# Patient Record
Sex: Female | Born: 1960
Health system: Southern US, Community
[De-identification: ages and names within clinical notes are randomized; demographics above are authoritative.]

## PROBLEM LIST (undated history)

## (undated) DIAGNOSIS — G5601 Carpal tunnel syndrome, right upper limb: Secondary | ICD-10-CM

## (undated) DIAGNOSIS — R51 Headache: Secondary | ICD-10-CM

## (undated) DIAGNOSIS — K219 Gastro-esophageal reflux disease without esophagitis: Secondary | ICD-10-CM

## (undated) DIAGNOSIS — Z973 Presence of spectacles and contact lenses: Secondary | ICD-10-CM

## (undated) DIAGNOSIS — Z9889 Other specified postprocedural states: Secondary | ICD-10-CM

## (undated) DIAGNOSIS — E039 Hypothyroidism, unspecified: Secondary | ICD-10-CM

## (undated) DIAGNOSIS — T50901A Poisoning by unspecified drugs, medicaments and biological substances, accidental (unintentional), initial encounter: Secondary | ICD-10-CM

## (undated) DIAGNOSIS — R569 Unspecified convulsions: Secondary | ICD-10-CM

## (undated) DIAGNOSIS — E7800 Pure hypercholesterolemia, unspecified: Secondary | ICD-10-CM

## (undated) DIAGNOSIS — T782XXA Anaphylactic shock, unspecified, initial encounter: Secondary | ICD-10-CM

## (undated) DIAGNOSIS — K3184 Gastroparesis: Secondary | ICD-10-CM

## (undated) DIAGNOSIS — T7840XA Allergy, unspecified, initial encounter: Secondary | ICD-10-CM

## (undated) DIAGNOSIS — L309 Dermatitis, unspecified: Secondary | ICD-10-CM

## (undated) DIAGNOSIS — N890 Mild vaginal dysplasia: Secondary | ICD-10-CM

## (undated) DIAGNOSIS — IMO0001 Reserved for inherently not codable concepts without codable children: Secondary | ICD-10-CM

## (undated) DIAGNOSIS — M8580 Other specified disorders of bone density and structure, unspecified site: Secondary | ICD-10-CM

## (undated) DIAGNOSIS — M1990 Unspecified osteoarthritis, unspecified site: Secondary | ICD-10-CM

## (undated) DIAGNOSIS — C73 Malignant neoplasm of thyroid gland: Secondary | ICD-10-CM

## (undated) DIAGNOSIS — F32A Depression, unspecified: Secondary | ICD-10-CM

## (undated) DIAGNOSIS — R112 Nausea with vomiting, unspecified: Secondary | ICD-10-CM

## (undated) DIAGNOSIS — F329 Major depressive disorder, single episode, unspecified: Secondary | ICD-10-CM

## (undated) DIAGNOSIS — R000 Tachycardia, unspecified: Secondary | ICD-10-CM

## (undated) HISTORY — DX: Anaphylactic shock, unspecified, initial encounter: T78.2XXA

## (undated) HISTORY — DX: Gastro-esophageal reflux disease without esophagitis: K21.9

## (undated) HISTORY — DX: Malignant neoplasm of thyroid gland: C73

## (undated) HISTORY — DX: Mild vaginal dysplasia: N89.0

## (undated) HISTORY — DX: Hypothyroidism, unspecified: E03.9

## (undated) HISTORY — DX: Depression, unspecified: F32.A

## (undated) HISTORY — DX: Gastroparesis: K31.84

## (undated) HISTORY — DX: Other specified disorders of bone density and structure, unspecified site: M85.80

## (undated) HISTORY — DX: Pure hypercholesterolemia, unspecified: E78.00

## (undated) HISTORY — DX: Poisoning by unspecified drugs, medicaments and biological substances, accidental (unintentional), initial encounter: T50.901A

## (undated) HISTORY — PX: NISSEN FUNDOPLICATION: SHX2091

## (undated) HISTORY — DX: Reserved for inherently not codable concepts without codable children: IMO0001

## (undated) HISTORY — PX: BREAST SURGERY: SHX581

## (undated) HISTORY — PX: COSMETIC SURGERY: SHX468

## (undated) HISTORY — PX: OTHER SURGICAL HISTORY: SHX169

## (undated) HISTORY — DX: Allergy, unspecified, initial encounter: T78.40XA

## (undated) HISTORY — DX: Major depressive disorder, single episode, unspecified: F32.9

---

## 2001-02-01 ENCOUNTER — Ambulatory Visit (HOSPITAL_COMMUNITY): Admission: RE | Admit: 2001-02-01 | Discharge: 2001-02-01 | Payer: Self-pay | Admitting: Family Medicine

## 2002-06-04 ENCOUNTER — Emergency Department (HOSPITAL_COMMUNITY): Admission: EM | Admit: 2002-06-04 | Discharge: 2002-06-04 | Payer: Self-pay | Admitting: Emergency Medicine

## 2002-11-23 HISTORY — PX: CHOLECYSTECTOMY: SHX55

## 2002-12-15 ENCOUNTER — Emergency Department (HOSPITAL_COMMUNITY): Admission: EM | Admit: 2002-12-15 | Discharge: 2002-12-15 | Payer: Self-pay | Admitting: Emergency Medicine

## 2003-02-02 ENCOUNTER — Encounter (INDEPENDENT_AMBULATORY_CARE_PROVIDER_SITE_OTHER): Payer: Self-pay | Admitting: *Deleted

## 2003-02-02 ENCOUNTER — Ambulatory Visit (HOSPITAL_COMMUNITY): Admission: RE | Admit: 2003-02-02 | Discharge: 2003-02-02 | Payer: Self-pay | Admitting: Emergency Medicine

## 2003-02-02 ENCOUNTER — Encounter: Payer: Self-pay | Admitting: Emergency Medicine

## 2003-03-20 ENCOUNTER — Encounter (INDEPENDENT_AMBULATORY_CARE_PROVIDER_SITE_OTHER): Payer: Self-pay | Admitting: Specialist

## 2003-03-20 ENCOUNTER — Observation Stay (HOSPITAL_COMMUNITY): Admission: RE | Admit: 2003-03-20 | Discharge: 2003-03-21 | Payer: Self-pay

## 2003-03-20 HISTORY — PX: VARICOSE VEIN SURGERY: SHX832

## 2004-08-26 ENCOUNTER — Ambulatory Visit (HOSPITAL_COMMUNITY): Admission: RE | Admit: 2004-08-26 | Discharge: 2004-08-26 | Payer: Self-pay | Admitting: Gastroenterology

## 2004-08-26 ENCOUNTER — Encounter (INDEPENDENT_AMBULATORY_CARE_PROVIDER_SITE_OTHER): Payer: Self-pay | Admitting: *Deleted

## 2004-08-26 HISTORY — PX: UPPER GASTROINTESTINAL ENDOSCOPY: SHX188

## 2004-10-13 ENCOUNTER — Inpatient Hospital Stay (HOSPITAL_COMMUNITY): Admission: RE | Admit: 2004-10-13 | Discharge: 2004-10-15 | Payer: Self-pay | Admitting: General Surgery

## 2004-10-13 ENCOUNTER — Encounter (INDEPENDENT_AMBULATORY_CARE_PROVIDER_SITE_OTHER): Payer: Self-pay | Admitting: *Deleted

## 2004-12-17 ENCOUNTER — Ambulatory Visit: Payer: Self-pay | Admitting: Oncology

## 2005-01-20 ENCOUNTER — Encounter (INDEPENDENT_AMBULATORY_CARE_PROVIDER_SITE_OTHER): Payer: Self-pay | Admitting: *Deleted

## 2005-01-20 ENCOUNTER — Ambulatory Visit: Payer: Self-pay | Admitting: Oncology

## 2005-01-20 ENCOUNTER — Ambulatory Visit (HOSPITAL_COMMUNITY): Admission: RE | Admit: 2005-01-20 | Discharge: 2005-01-20 | Payer: Self-pay | Admitting: Oncology

## 2005-05-24 ENCOUNTER — Emergency Department (HOSPITAL_COMMUNITY): Admission: EM | Admit: 2005-05-24 | Discharge: 2005-05-24 | Payer: Self-pay | Admitting: Emergency Medicine

## 2005-06-17 ENCOUNTER — Ambulatory Visit: Payer: Self-pay | Admitting: Oncology

## 2005-07-06 ENCOUNTER — Ambulatory Visit (HOSPITAL_COMMUNITY): Admission: RE | Admit: 2005-07-06 | Discharge: 2005-07-06 | Payer: Self-pay | Admitting: Gastroenterology

## 2005-11-23 DIAGNOSIS — C73 Malignant neoplasm of thyroid gland: Secondary | ICD-10-CM

## 2005-11-23 HISTORY — PX: VAGINAL HYSTERECTOMY: SUR661

## 2005-11-23 HISTORY — PX: THYROID SURGERY: SHX805

## 2005-11-23 HISTORY — DX: Malignant neoplasm of thyroid gland: C73

## 2005-12-16 ENCOUNTER — Ambulatory Visit: Payer: Self-pay | Admitting: Oncology

## 2006-06-07 ENCOUNTER — Ambulatory Visit (HOSPITAL_COMMUNITY): Admission: RE | Admit: 2006-06-07 | Discharge: 2006-06-08 | Payer: Self-pay | Admitting: Gynecology

## 2006-06-07 ENCOUNTER — Encounter (INDEPENDENT_AMBULATORY_CARE_PROVIDER_SITE_OTHER): Payer: Self-pay | Admitting: Specialist

## 2006-06-14 ENCOUNTER — Ambulatory Visit: Payer: Self-pay | Admitting: Oncology

## 2006-06-17 LAB — CBC WITH DIFFERENTIAL/PLATELET
BASO%: 0.5 % (ref 0.0–2.0)
Basophils Absolute: 0.1 10e3/uL (ref 0.0–0.1)
EOS%: 3.4 % (ref 0.0–7.0)
Eosinophils Absolute: 0.4 10e3/uL (ref 0.0–0.5)
HCT: 41.3 % (ref 34.8–46.6)
HGB: 13.9 g/dL (ref 11.6–15.9)
LYMPH%: 31.5 % (ref 14.0–48.0)
MCH: 31.4 pg (ref 26.0–34.0)
MCHC: 33.8 g/dL (ref 32.0–36.0)
MCV: 92.9 fL (ref 81.0–101.0)
MONO#: 0.8 10e3/uL (ref 0.1–0.9)
MONO%: 7 % (ref 0.0–13.0)
NEUT#: 6.5 10e3/uL (ref 1.5–6.5)
NEUT%: 57.6 % (ref 39.6–76.8)
Platelets: 354 10e3/uL (ref 145–400)
RBC: 4.44 10e6/uL (ref 3.70–5.32)
RDW: 12.4 % (ref 11.3–14.5)
WBC: 11.2 10e3/uL — ABNORMAL HIGH (ref 3.9–10.0)
lymph#: 3.5 10e3/uL — ABNORMAL HIGH (ref 0.9–3.3)

## 2006-06-17 LAB — COMPREHENSIVE METABOLIC PANEL
ALT: 20 U/L (ref 0–40)
AST: 22 U/L (ref 0–37)
Alkaline Phosphatase: 52 U/L (ref 39–117)
BUN: 9 mg/dL (ref 6–23)
Creatinine, Ser: 0.75 mg/dL (ref 0.40–1.20)
Total Bilirubin: 0.6 mg/dL (ref 0.3–1.2)

## 2007-05-18 ENCOUNTER — Other Ambulatory Visit: Admission: RE | Admit: 2007-05-18 | Discharge: 2007-05-18 | Payer: Self-pay | Admitting: Gynecology

## 2007-08-29 ENCOUNTER — Ambulatory Visit (HOSPITAL_COMMUNITY): Admission: RE | Admit: 2007-08-29 | Discharge: 2007-08-29 | Payer: Self-pay | Admitting: Emergency Medicine

## 2008-04-11 ENCOUNTER — Ambulatory Visit (HOSPITAL_COMMUNITY): Admission: RE | Admit: 2008-04-11 | Discharge: 2008-04-11 | Payer: Self-pay | Admitting: Gastroenterology

## 2008-05-24 ENCOUNTER — Other Ambulatory Visit: Admission: RE | Admit: 2008-05-24 | Discharge: 2008-05-24 | Payer: Self-pay | Admitting: Gynecology

## 2008-11-28 ENCOUNTER — Encounter: Payer: Self-pay | Admitting: Gynecology

## 2008-11-28 ENCOUNTER — Ambulatory Visit: Payer: Self-pay | Admitting: Gynecology

## 2008-11-28 ENCOUNTER — Other Ambulatory Visit: Admission: RE | Admit: 2008-11-28 | Discharge: 2008-11-28 | Payer: Self-pay | Admitting: Gynecology

## 2009-02-27 ENCOUNTER — Emergency Department (HOSPITAL_COMMUNITY): Admission: EM | Admit: 2009-02-27 | Discharge: 2009-02-27 | Payer: Self-pay | Admitting: Emergency Medicine

## 2009-06-10 ENCOUNTER — Encounter: Payer: Self-pay | Admitting: Gynecology

## 2009-06-10 ENCOUNTER — Other Ambulatory Visit: Admission: RE | Admit: 2009-06-10 | Discharge: 2009-06-10 | Payer: Self-pay | Admitting: Gynecology

## 2009-06-10 ENCOUNTER — Ambulatory Visit: Payer: Self-pay | Admitting: Gynecology

## 2009-06-11 ENCOUNTER — Ambulatory Visit: Payer: Self-pay | Admitting: Gynecology

## 2009-07-09 ENCOUNTER — Encounter: Admission: RE | Admit: 2009-07-09 | Discharge: 2009-07-09 | Payer: Self-pay | Admitting: Emergency Medicine

## 2009-12-27 ENCOUNTER — Other Ambulatory Visit: Admission: RE | Admit: 2009-12-27 | Discharge: 2009-12-27 | Payer: Self-pay | Admitting: Gynecology

## 2009-12-27 ENCOUNTER — Ambulatory Visit: Payer: Self-pay | Admitting: Gynecology

## 2010-02-24 ENCOUNTER — Encounter: Admission: RE | Admit: 2010-02-24 | Discharge: 2010-02-24 | Payer: Self-pay | Admitting: Emergency Medicine

## 2010-08-13 ENCOUNTER — Ambulatory Visit: Payer: Self-pay | Admitting: Gynecology

## 2010-08-13 ENCOUNTER — Other Ambulatory Visit: Admission: RE | Admit: 2010-08-13 | Discharge: 2010-08-13 | Payer: Self-pay | Admitting: Gynecology

## 2010-08-22 ENCOUNTER — Ambulatory Visit: Payer: Self-pay | Admitting: Gynecology

## 2010-10-22 ENCOUNTER — Emergency Department (HOSPITAL_COMMUNITY)
Admission: EM | Admit: 2010-10-22 | Discharge: 2010-10-23 | Payer: Self-pay | Source: Home / Self Care | Admitting: Emergency Medicine

## 2010-10-31 ENCOUNTER — Emergency Department (HOSPITAL_COMMUNITY)
Admission: EM | Admit: 2010-10-31 | Discharge: 2010-10-31 | Payer: Self-pay | Source: Home / Self Care | Admitting: Emergency Medicine

## 2010-11-16 ENCOUNTER — Emergency Department (HOSPITAL_COMMUNITY)
Admission: EM | Admit: 2010-11-16 | Discharge: 2010-11-16 | Payer: Self-pay | Source: Home / Self Care | Admitting: Emergency Medicine

## 2010-11-23 HISTORY — PX: HERNIA REPAIR: SHX51

## 2010-12-14 ENCOUNTER — Encounter: Payer: Self-pay | Admitting: Gastroenterology

## 2011-01-15 ENCOUNTER — Emergency Department (HOSPITAL_BASED_OUTPATIENT_CLINIC_OR_DEPARTMENT_OTHER)
Admission: EM | Admit: 2011-01-15 | Discharge: 2011-01-15 | Disposition: A | Discharge status: Home / Self Care | Payer: Managed Care, Other (non HMO) | Attending: Emergency Medicine | Admitting: Emergency Medicine

## 2011-01-15 ENCOUNTER — Emergency Department (HOSPITAL_COMMUNITY)
Admission: EM | Admit: 2011-01-15 | Discharge: 2011-01-15 | Disposition: A | Discharge status: Home / Self Care | Payer: Managed Care, Other (non HMO) | Attending: Emergency Medicine | Admitting: Emergency Medicine

## 2011-01-15 DIAGNOSIS — Z9109 Other allergy status, other than to drugs and biological substances: Secondary | ICD-10-CM | POA: Insufficient documentation

## 2011-01-15 DIAGNOSIS — T7840XA Allergy, unspecified, initial encounter: Secondary | ICD-10-CM | POA: Insufficient documentation

## 2011-01-15 DIAGNOSIS — Z79899 Other long term (current) drug therapy: Secondary | ICD-10-CM | POA: Insufficient documentation

## 2011-01-15 DIAGNOSIS — Z9889 Other specified postprocedural states: Secondary | ICD-10-CM | POA: Insufficient documentation

## 2011-01-15 DIAGNOSIS — R0609 Other forms of dyspnea: Secondary | ICD-10-CM | POA: Insufficient documentation

## 2011-01-15 DIAGNOSIS — Y929 Unspecified place or not applicable: Secondary | ICD-10-CM | POA: Insufficient documentation

## 2011-01-15 DIAGNOSIS — E789 Disorder of lipoprotein metabolism, unspecified: Secondary | ICD-10-CM | POA: Insufficient documentation

## 2011-01-15 DIAGNOSIS — I10 Essential (primary) hypertension: Secondary | ICD-10-CM | POA: Insufficient documentation

## 2011-01-15 DIAGNOSIS — R0989 Other specified symptoms and signs involving the circulatory and respiratory systems: Secondary | ICD-10-CM | POA: Insufficient documentation

## 2011-01-15 DIAGNOSIS — J45909 Unspecified asthma, uncomplicated: Secondary | ICD-10-CM | POA: Insufficient documentation

## 2011-03-04 LAB — POCT I-STAT, CHEM 8
Calcium, Ion: 1.14 mmol/L (ref 1.12–1.32)
Chloride: 108 mEq/L (ref 96–112)
Creatinine, Ser: 0.7 mg/dL (ref 0.4–1.2)
Glucose, Bld: 121 mg/dL — ABNORMAL HIGH (ref 70–99)
HCT: 40 % (ref 36.0–46.0)
Hemoglobin: 13.6 g/dL (ref 12.0–15.0)
Potassium: 4.3 mEq/L (ref 3.5–5.1)

## 2011-04-10 NOTE — Discharge Summary (Signed)
NAME:  Michelle [name], Michelle [name]                ACCOUNT NO.:  [id number]   MEDICAL RECORD NO.:  [id number]          PATIENT TYPE:  OIB   LOCATION:  9311                          FACILITY:  [facility]   PHYSICIAN:  Timothy P. Fontaine, M.D.DATE OF BIRTH:  18-Aug-1961   DATE OF ADMISSION:  06/07/2006  DATE OF DISCHARGE:                                 DISCHARGE SUMMARY   DISCHARGE DIAGNOSES:  Irregular menses, menorrhagia.   PROCEDURE:  Total vaginal hysterectomy 06/07/2006, Dr. Colin Broach.   PATHOLOGY:  Pending.   HOSPITAL COURSE:  50 year old G1, P28 female with irregular menses and  menorrhagia, underwent uncomplicated total vaginal hysterectomy 06/07/2006.  The patient's postoperative course was uncomplicated.  She was discharged on  postoperative day #1 ambulating well, tolerating a regular diet with a  postoperative hemoglobin of 10.8.  The patient received precautions,  instructions and follow-up will be seen in the office in 2 weeks, received a  prescription for Tylox #20, one to two p.o. q.4-6 h. p.r.n. pain.      Timothy P. Fontaine, M.D.  Electronically Signed     TPF/MEDQ  D:  06/08/2006  T:  06/08/2006  Job:  [id number]

## 2011-04-10 NOTE — H&P (Signed)
NAME:  Michelle [name], Michelle [name]                ACCOUNT NO.:  [id number]   MEDICAL RECORD NO.:  [id number]          PATIENT TYPE:  AMB   LOCATION:  SDC                           FACILITY:  [facility]   PHYSICIAN:  Timothy P. Fontaine, M.D.DATE OF BIRTH:  1961-11-13   DATE OF ADMISSION:  DATE OF DISCHARGE:                                HISTORY & PHYSICAL   CHIEF COMPLAINT:  Irregular menses.   HISTORY OF PRESENT ILLNESS:  A 50 year old G62, P84 female vasectomy birth  control who presents complaining of irregular menses where over the past  year she has had bleeding on and off throughout the month to include  significant postcoital bleeding as well as spontaneous spotting almost on a  daily basis.  Outpatient evaluation includes a negative sonohysterogram, an  endometrial biopsy showing weakly proliferative endometrium, no hyperplasia,  and laboratory studies to include normal thyroid, FSH, and prolactin levels.  The options for management to include hormonal manipulation with a  combination of estrogen and progesterone as well as intermittent  progesterone withdrawal, Jearld Adjutant IUD, endometrial ablation, and hysterectomy  were all reviewed.  The patient was to proceed with hysterectomy.  She is  admitted for a total vaginal hysterectomy.   PAST SURGICAL HISTORY:  Significant for partial thyroid resection due to  papillary carcinoma of the thyroid, Nissen fundoplication for reflux  disease, breast reductive surgery and buttocks liposuction and implant  surgery   PAST MEDICAL HISTORY:  1.  Asthma.  2.  Hypercholesterolemia.  3.  History of hypertension although she is off of all blood pressure      medications with a normal blood pressure.  4.  Reflux.   CURRENT MEDICATIONS:  Her current medications include Crestor, Zetia,  Singulair and p.r.n. albuterol inhaler.   MEDICATION ALLERGIES:  INCLUDE BACTRIM, DOXYCYCLINE, BIAXIN, SYNTHROID   REVIEW OF SYSTEMS:  Noncontributory.   SOCIAL HISTORY:   Noncontributory.   FAMILY HISTORY:  Noncontributory.   ADMISSION PHYSICAL EXAMINATION:  VITAL SIGNS:  Afebrile.  Vital signs are  stable.  HEENT:  Normal.  LUNGS:  Clear.  CARDIAC:  Regular rate without rubs, murmurs or gallops.  ABDOMINAL EXAM:  Benign.  PELVIC:  External, BUS, vagina normal.  Cervix normal.  Uterus normal size,  midline, and mobile.  Adnexa without masses or tenderness.   ASSESSMENT:  A 50 year old G1, P18 female irregular menses, normal  ultrasound, sonohistogram negative, endometrial biopsy, normal hormone  values for total vaginal hysterectomy.  The risks, benefits, indications and  alternatives for the procedure were reviewed with her to include long-term  and short-term issues, intraoperative postoperative risks.  Ovarian  conservation issue was reviewed with her and her husband and the options of  keeping both ovaries versus removing both ovaries were discussed, the issues  of keeping her ovaries with the risk of ovarian cancer in the future as well  as benign ovarian disease requiring reoperation versus removing her ovaries  and the issues of hypoestrogenism, possibilities of estrogen-replacement  therapy with its inherent risks were all discussed.  The patient prefers to  keep both ovaries.  She accepts the risks  of ovarian cancer and ovarian  disease in the future.  She does give me permission though to remove one or  both ovaries if significant disease is encountered at the time of surgery.  Absolute irreversible sterility associated with hysterectomy was also  discussed, understood, and accepted.  Sexuality following hysterectomy was  reviewed, the potential for persistent dyspareunia as well as orgasmic  dysfunction in the future was all discussed, understood and accepted.  The  patient understands that at any time during the procedure that I may need to  convert the vaginal hysterectomy into an abdominal hysterectomy if it is  felt unwise to continue or  if complications occur during the procedure.  The  risks of infection both internal requiring prolonged antibiotics, abscess  formation or cuff hematoma/cuff abscess requiring reoperation and drainage  as well as incisional complications if indeed incisions are made requiring  opening and draining of incisions and closure by secondary intention was all  discussed, understood and accepted.  The risks of hemorrhage necessitating  transfusion and risks of transfusion were reviewed to include transfusion  reaction, hepatitis, HIV, Mad Cow Disease, and other unknown entities.  The  risk of inadvertent injury to internal organs including bowel, bladder,  ureters, vessels and nerves necessitating major exploratory reparative  surgeries and future reparative surgeries including ostomy formation either  immediately recognized or delay recognized was all discussed, understood and  accepted.  The patient had previously been on antihypertensives although no  longer is on these with a normal blood pressure.  She underwent cardiac  clearance [facility] with a normal ECG, normal  stress ECG, all of which were normal.  Preoperative blood work is pending at  the time of this dictation.  The patient's questions were answered to her  satisfaction, and she is ready to proceed with surgery.      Timothy P. Fontaine, M.D.  Electronically Signed     TPF/MEDQ  D:  05/31/2006  T:  05/31/2006  Job:  [id number]

## 2011-04-10 NOTE — Op Note (Signed)
NAME:  Michelle [name], Michelle [name]                ACCOUNT NO.:  [id number]   MEDICAL RECORD NO.:  [id number]          PATIENT TYPE:  AMB   LOCATION:  ENDO                         FACILITY:  [facility]   PHYSICIAN:  James L. Malon Kindle., M.D.DATE OF BIRTH:  Jan 09, 1961   DATE OF PROCEDURE:  08/26/2004  DATE OF DISCHARGE:                                 OPERATIVE REPORT   PROCEDURES:  Esophageal manometry.   INDICATION FOR PROCEDURE:  An unfortunate 50 year old woman with continued  severe esophageal reflux, who is being considered for an antireflux  operation.  Endoscopy done just before this procedure showed a hiatal hernia  with essentially a nondetectable LES and evidence of reflux.   DESCRIPTION OF PROCEDURE:  The procedure was performed at the [facility]  Manometry Laboratory in the usual manner.  No provocative studies were  performed.  The results are as follows:  1.  Upper esophageal sphincter  poorly seen on available tracings.  Unable to actually evaluate relaxation.  2.  Esophageal body.  Mean amplitude in the distal esophagus 39 mm, within  the normal range, but somewhat on the low side.  Mean duration of 3.4  seconds, within the normal range.  Peristalsis in all 10 wet swallows  appeared to be normal.  3.  Lower esophageal sphincter.  The mean pressure  is calculated by the computer and was 9.7 mm with nearly complete  relaxation.  However, my review of the tracings showed essentially and  really nondetectable LES.   ASSESSMENT:  Hypotensive lower esophageal sphincter consistent with severe  esophageal reflux disease.   PLAN:  Will proceed with surgical referral as planned.       JLE/MEDQ  D:  08/26/2004  T:  08/26/2004  Job:  [id number]   cc:   Stan Head. Cleta Alberts, M.D.  [address]  [place]  [place] [address]  Fax: [phone]   Lorne Skeens. Hoxworth, M.D.  [address], [address]  [place]  [place] [address]  Fax: [phone]

## 2011-04-10 NOTE — Op Note (Signed)
NAMECAYLEY, Michelle [name]                            ACCOUNT NO.:  [id number]   MEDICAL RECORD NO.:  [id number]                   PATIENT TYPE:  AMB   LOCATION:  DAY                                  FACILITY:  [facility]   PHYSICIAN:  Lorre Munroe., M.D.            DATE OF BIRTH:  01-19-1961   DATE OF PROCEDURE:  03/20/2003  DATE OF DISCHARGE:                                 OPERATIVE REPORT   PREOPERATIVE DIAGNOSES:  1. Right lobe thyroid nodule, benign.  2. Varicose vein on the right leg.   POSTOPERATIVE DIAGNOSES:  1. Right lobe thyroid nodule, benign.  2. Varicose vein on the right leg.   OPERATION:  1. Partial thyroidectomy.  2. Excision of varicose vein.   SURGEON:  Lebron Conners, M.D.   ASSISTANT:  Gita Kudo, M.D.   ANESTHESIA:  General.   DESCRIPTION OF PROCEDURE:  After the patient was monitored and anesthetized  and had routine preparation and draping of the neck, I marked an 8 cm collar  incision symmetrically in the lower neck, incised the skin, subcutaneous  tissues and platysma. I got hemostasis with the Bovie. I then developed  flaps beneath the platysma superiorly to the thyroid cartilage and  inferiorly to the sternal notch and clavicle. I put in a Mahorner retractor  and that provided excellent exposure of the midline of the neck. I incised  the midline and exposed the isthmus and anterior parts of the thyroid gland.  There was an obvious nodule in the anterior part of the right lobe just as  it joined the isthmus. I dissected the anterior surface of the gland free  laterally on both sides and found no additional nodules. It appeared that  the nodule could be safely and easily excised with a rim of normal thyroid  tissue by removing the isthmus and small part of the anterior right lobe. I  took down the vessels clipping them as necessary with small or medium clips.  I divided the isthmus to the left of the trachea suture ligating the portion  towards  the left lobe. I used 3-0 silk for this suture ligature. I then  dissected laterally well past the nodule and clamped the anterior part of  the right lobe and removed the isthmus. I then suture ligated that. After  good hemostasis, I closed the neck closing the midline with interrupted  silks, the platysma with running 3-0 Vicryl and the skin with intracuticular  4-0 Vicryl and  Steri-Strips. I made two small incisions on the prepped medial aspect of the  right leg and incised the vein which the patient desired to have removed.  Hemostasis was not a problem. After bandage application, the patient went to  the recovery room in good condition.  Lorre Munroe., M.D.    WB/MEDQ  D:  03/20/2003  T:  03/20/2003  Job:  [id number]   cc:   Brett Canales A. Cleta Alberts, M.D.  [address]  [place]  [place] [address]  Fax: [phone]

## 2011-04-10 NOTE — Op Note (Signed)
[name], [name]                ACCOUNT NO.:  [id number]   MEDICAL RECORD NO.:  [id number]          PATIENT TYPE:  INP   LOCATION:  0444                         FACILITY:  [facility]   PHYSICIAN:  Sharlet Salina T. Hoxworth, M.D.DATE OF BIRTH:  15-Feb-1961   DATE OF PROCEDURE:  10/13/2004  DATE OF DISCHARGE:                                 OPERATIVE REPORT   PREOPERATIVE DIAGNOSES:  1.  Severe gastroesophageal reflux disease.  2.  Biliary dyskinesia.   SURGICAL PROCEDURE:  1.  Laparoscopic Nissen fundoplication.  2.  Laparoscopic cholecystectomy.   SURGEON:  Dr. Johna Sheriff   ASSISTANT:  Dr. Carman Ching   ANESTHESIA:  General.   BRIEF HISTORY:  Michelle [name] is a 50 year old female with long history of  gastroesophageal reflux disease, now very poorly controlled with medical  management.  After extensive discussion of options, we have elected to  proceed with laparoscopic Nissen fundoplication for her reflux disease.  She  also has had intermittent episodic right upper quadrant abdominal pain and  has a markedly abnormal HIDA scan with 0% gallbladder emptying in 60  minutes.  She is felt to have significant biliary dyskinesia, and  laparoscopic cholecystectomy at the same procedure is recommended and  accepted.  The nature of the procedure, its indications; risks of bleeding,  infection, intestinal injury, bile leak, bile duct injury, and various side  effects of her fundoplication have been discussed and understood.  She is  now brought to the operating room for this procedure.   DESCRIPTION OF OPERATION:  The patient brought to the operating room and  placed in supine position on the operating table.  General endotracheal  anesthesia was induced.  She was given preoperative antibiotics.  The  abdomen was widely sterilely prepped and draped.  Access was obtained with  11 mm OptiView trocar in the left subcostal space without difficulty and  pneumoperitoneum established.  Under direct  vision, two 11 mm trocars were  placed in the right upper abdomen, another 11 mm in the left abdomen near  the midline, and a 5 mm along the right flank.  The gastrohepatic omentum  was divided in an avascular area.  Dissection was carried up along the right  crus with the harmonic scalpel, and peritoneum overlying the EG junction was  incised with the harmonic scalpel.  Dissection was carried down along the  left crus, and the angle of His was mobilized.  There was plenty of  esophageal length.  The EG junction was retracted inferiorly and anteriorly,  and the right crus was mobilized and the retroesophageal space easily  entered and the esophagus more fully mobilized for 5-6 cm.  Dissection was  carried over to the left crus which was identified and a retroesophageal  window easily created.  Following this, short gastrics were mobilized off of  the upper fundus with the harmonic scalpel and the lesser sac entered.  The  fundus, angle of His, and EG junction were completely mobilized.  The crura  were cleared down to their junction posteriorly.  There was a good sized  hiatal hernia present.  Initially this was repaired with two posterior  sutures of 2-0 Ethibond with pledgets.  In order to avoid angulation of the  esophagus, I placed one pledgeted 2-0 Ethibond suture anteriorly as well.  The lighted 50 Bougie dilator was then passed which went through the repair  with no additional room, and this appeared to be an adequate closure.  The  fundus was brought posteriorly for the 360-degree wrap easily, and a  contiguous portion of the fundus was identified on the left side and brought  up for a 360-degree wrap, making sure this was contiguous and went easily  over the 50 Bougie dilator.  An approximately 3 cm wrap was then created  with three interrupted 2-0 Ethibond sutures, incorporating the anterior wall  of the esophagus with each suture.  With the Bougie then removed, this was  seen to  be a nice, floppy wrap.  At this point, attention was turned to the  cholecystectomy.  An additional 5 mm trocar was placed on the right flank  for retraction, the fundus grasped and elevated and the infundibulum  retracted inferolaterally with adequate exposure of the Calot's triangle and  distal gallbladder.  The peritoneum on either side of the distal gallbladder  and Calot's triangle was incised and with careful blunt dissection, the  cystic duct and cystic artery were clearly identified and the cystic duct  gallbladder junction dissected 360 degrees.  When the anatomy was clear, the  cystic duct and cystic artery were divided between two proximal and one  distal clip.  The gallbladder was then dissected free from its bed using  cautery and removed through one of the 11 mm trocar sites.  Complete  hemostasis was obtained in the gallbladder bed with cautery and a Surgicel  pack.  Both operative sites were then inspected for hemostasis and trocars  removed under direct vision, all CO2 evacuated.  Skin incisions were closed  with interrupted subcuticular 4-0 Monocryl and Steri-Strips.  Sponge,  needle, and instrument counts were correct.  Dry sterile dressings were  applied and the patient taken to recovery in good condition.     Benj   BTH/MEDQ  D:  10/13/2004  T:  10/13/2004  Job:  [id number]

## 2011-04-10 NOTE — Op Note (Signed)
NAME:  Michelle [name], Michelle [name]                ACCOUNT NO.:  [id number]   MEDICAL RECORD NO.:  [id number]          PATIENT TYPE:  AMB   LOCATION:  ENDO                         FACILITY:  [facility]   PHYSICIAN:  James L. Malon Kindle., M.D.DATE OF BIRTH:  March 05, 1961   DATE OF PROCEDURE:  08/26/2004  DATE OF DISCHARGE:                                 OPERATIVE REPORT   PROCEDURE:  .  Esophagogastroduodenoscopy and biopsy.   ENDOSCOPIST:  Llana Aliment. Edwards, M.D.   MEDICATIONS:  Fentanyl 60 mcg, Versed 10 mg IV.   INDICATION:  A nice 50 year old woman with persistent longstanding  esophageal reflux that has gradually gotten worse.  An upper GI has shown  hiatal hernia with reflux.  This is done in anticipation of a probable  antireflux operation.   DESCRIPTION OF PROCEDURE:  The procedure had been explained to the patient  and consent obtained.  With the patient in the left lateral decubitus  position, the Olympus scope was inserted and advanced.  The stomach was  entered, pylorus identified and passed.  The duodenum including the bulb and  second portion were seen well, and were unremarkable.  The pyloric channel  was normal, as was the antrum.  In the proximal body near the junction of  the cardia was a 1-cm gastric polyp that was biopsied.  The diaphragm was  located at 38 cm.  There was a 4-cm hiatal hernia.  The Z-line was somewhat  irregular, but there was no gross evidence of Barrett's and was located at  34 cm, and was widely patent with free reflux noted.  The distal esophagus  was somewhat reddened, but was not ulcerated.  The scope was withdrawn; the  proximal esophagus was normal.  The patient tolerated the procedure well and  was maintained on a low-flow oxygen and pulse oximetry throughout the  procedure.   ASSESSMENT:  1.  Esophageal reflux, 530.81.  2.  Gastric polyp, possibly a fundic gland or hyperplastic, 211.1.   PLAN:  We will check pathology and refer to Dr. Sharlet Salina T.  Hoxworth.  We  will also check manometry results.       JLE/MEDQ  D:  08/26/2004  T:  08/26/2004  Job:  [id number]   cc:   Stan Head. Cleta Alberts, M.D.  [address]  [place]  [place] [address]  Fax: [phone]   Lorne Skeens. Hoxworth, M.D.  [address]  [place]  [place] [address]  Fax: [phone]

## 2011-04-10 NOTE — Op Note (Signed)
NAME:  Michelle [name], Michelle [name]                ACCOUNT NO.:  [id number]   MEDICAL RECORD NO.:  [id number]          PATIENT TYPE:  AMB   LOCATION:  SDC                           FACILITY:  [facility]   PHYSICIAN:  Timothy P. Fontaine, M.D.DATE OF BIRTH:  [birth year]   DATE OF PROCEDURE:  06/07/2006  DATE OF DISCHARGE:                                 OPERATIVE REPORT   PREOPERATIVE DIAGNOSES:  1.  Irregular menses.  2.  Menorrhagia.   POSTOPERATIVE DIAGNOSES:  1.  Irregular menses.  2.  Menorrhagia.   PROCEDURE:  Total vaginal hysterectomy.   SURGEON:  Timothy P. Fontaine, M.D.   ASSISTANT:  Rande Brunt. Eda Paschal, M.D.   ANESTHESIA:  General.   ESTIMATED BLOOD LOSS:  Less than 100 mL.   COMPLICATIONS:  None.   SPECIMEN:  Uterus.   FINDINGS:  EUA:  External, BUS, vagina normal.  Cervix normal, uterus normal  size, midline and mobile.  Adnexa without masses.  Surgical:  Uterus grossly  normal in size, shape and contour, right and left ovaries and right and left  fallopian tube segments grossly normal, pelvis normal to limited inspection.   PROCEDURE:  The patient was taken to the operating room, underwent general  endotracheal anesthesia, was placed low dorsal lithotomy position and  received a perineal vaginal preparation with Betadine solution.  Bladder  emptied with in-and-out Foley catheterization.  EUA was performed.  The  patient was draped in the usual fashion.  Cervix visualized with a weighted  speculum, grasped with a single-tooth tenaculum and the cervical mucosa was  circumferentially injected using 1% lidocaine with epinephrine 1:100,000  dilution, total of 8 mL.  The cervical mucosa was then circumferentially  incised and the paracervical planes were sharply developed.  The posterior  cul-de-sac was then sharply entered without difficulty.  A long weighted  speculum placed and the right and left uterosacral ligaments were  identified, clamped, cut and ligated using 0 Vicryl  suture and tagged for  future reference.  The vesicouterine plane was then progressively sharply  developed and ultimately the anterior cul-de-sac sharply entered.  The  uterus was then freed from its attachments through progressive clamping,  cutting and ligating of the paracervical parametrial tissues using 0 Vicryl  suture.  Uterus was then delivered through the vagina and the uterine  ovarian pedicles bilaterally were clamped and cut and the specimen removed.  The bilateral pedicles were then secured using a 0 Vicryl stitch followed by  suture ligature bilaterally.  The long weighted speculum was replaced by a  shorter weighted speculum, the bowel packed with a tagged tail sponge and  the pelvis was irrigated showing adequate hemostasis at all pedicle sites.  The posterior vaginal mucosa was then run from uterosacral ligament to  uterosacral ligament using 0 Vicryl suture in a running interlocking stitch.  The bowel packing was then removed and the vagina was closed anterior to  posterior using 0 Vicryl suture in interrupted figure-of-eight stitch.  The  vagina was irrigated.  Adequate hemostasis visualized at the cuff.  A Foley  catheter was placed in sterile technique  showing free-flowing clear yellow  urine.  The patient was placed in the supine position, awakened without  difficulty and taken to the recovery room in good condition having tolerated  procedure well.      Timothy P. Fontaine, M.D.  Electronically Signed     TPF/MEDQ  D:  06/07/2006  T:  06/07/2006  Job:  [id number]

## 2011-05-12 ENCOUNTER — Other Ambulatory Visit (HOSPITAL_COMMUNITY)
Admission: RE | Admit: 2011-05-12 | Discharge: 2011-05-12 | Disposition: A | Discharge status: Home / Self Care | Payer: Managed Care, Other (non HMO) | Source: Ambulatory Visit | Attending: Gynecology | Admitting: Gynecology

## 2011-05-12 ENCOUNTER — Ambulatory Visit (INDEPENDENT_AMBULATORY_CARE_PROVIDER_SITE_OTHER): Payer: Managed Care, Other (non HMO) | Admitting: Gynecology

## 2011-05-12 ENCOUNTER — Other Ambulatory Visit: Payer: Self-pay | Admitting: Gynecology

## 2011-05-12 DIAGNOSIS — N951 Menopausal and female climacteric states: Secondary | ICD-10-CM

## 2011-05-12 DIAGNOSIS — R87619 Unspecified abnormal cytological findings in specimens from cervix uteri: Secondary | ICD-10-CM | POA: Insufficient documentation

## 2011-05-12 DIAGNOSIS — N893 Dysplasia of vagina, unspecified: Secondary | ICD-10-CM

## 2011-06-10 ENCOUNTER — Encounter (HOSPITAL_BASED_OUTPATIENT_CLINIC_OR_DEPARTMENT_OTHER): Payer: Self-pay | Admitting: *Deleted

## 2011-06-10 ENCOUNTER — Emergency Department (HOSPITAL_BASED_OUTPATIENT_CLINIC_OR_DEPARTMENT_OTHER)
Admission: EM | Admit: 2011-06-10 | Discharge: 2011-06-10 | Disposition: A | Discharge status: Home / Self Care | Payer: Managed Care, Other (non HMO) | Attending: Emergency Medicine | Admitting: Emergency Medicine

## 2011-06-10 DIAGNOSIS — J3489 Other specified disorders of nose and nasal sinuses: Secondary | ICD-10-CM | POA: Insufficient documentation

## 2011-06-10 DIAGNOSIS — R0602 Shortness of breath: Secondary | ICD-10-CM | POA: Insufficient documentation

## 2011-06-10 DIAGNOSIS — E7800 Pure hypercholesterolemia, unspecified: Secondary | ICD-10-CM | POA: Insufficient documentation

## 2011-06-10 DIAGNOSIS — I10 Essential (primary) hypertension: Secondary | ICD-10-CM | POA: Insufficient documentation

## 2011-06-10 DIAGNOSIS — Z85850 Personal history of malignant neoplasm of thyroid: Secondary | ICD-10-CM | POA: Insufficient documentation

## 2011-06-10 DIAGNOSIS — T7840XA Allergy, unspecified, initial encounter: Secondary | ICD-10-CM

## 2011-06-10 DIAGNOSIS — K219 Gastro-esophageal reflux disease without esophagitis: Secondary | ICD-10-CM | POA: Insufficient documentation

## 2011-06-10 DIAGNOSIS — Z9889 Other specified postprocedural states: Secondary | ICD-10-CM | POA: Insufficient documentation

## 2011-06-10 DIAGNOSIS — J45909 Unspecified asthma, uncomplicated: Secondary | ICD-10-CM | POA: Insufficient documentation

## 2011-06-10 DIAGNOSIS — H5789 Other specified disorders of eye and adnexa: Secondary | ICD-10-CM | POA: Insufficient documentation

## 2011-06-10 HISTORY — DX: Gastro-esophageal reflux disease without esophagitis: K21.9

## 2011-06-10 MED ORDER — PREDNISONE 10 MG PO TABS: ORAL_TABLET | ORAL | Status: DC

## 2011-06-10 NOTE — ED Provider Notes (Signed)
History     Chief Complaint  Patient presents with  . Allergic Reaction    pt in via EMS from [facility]    Patient is a 50 y.o. female presenting with allergic reaction. The history is provided by the patient.  Allergic Reaction The primary symptoms are  wheezing and shortness of breath. The current episode started 3 to 5 hours ago. The problem has been rapidly improving. This is a new problem.  The onset of the reaction was associated with a new medication. Significant symptoms also include eye redness and rhinorrhea.   Pt was seen at Urgent care.  Pt was given tagamet, benadryl and solumedrol there.  Pt gave herself epi subq before going to Urgent care.  Pt reports she has had similar reactions in the past.  Pt is on allergy shots and got her shots today.  Past Medical History  Diagnosis Date  . Asthma   . Hypercholesteremia   . Hypertension   . Reflux   . Hypothyroidism   . Gastroparesis   . Thyroid cancer     papillary  . VAIN (vaginal intraepithelial neoplasia)   . GERD (gastroesophageal reflux disease)   . Thyroid cancer     Past Surgical History  Procedure Date  . Breast surgery     reduc tion mammoplasty  . Cosmetic surgery     buttock liposuction  . Vaginal hysterectomy   . Thyroid surgery     for thyroid cancer  . Nissen fundoplication     REFLUX  . Hernia repair     Family History  Problem Relation Age of Onset  . Heart disease Mother   . Hypertension Mother   . Heart disease Father   . Hypertension Father   . Heart disease Sister   . Hypertension Sister   . Diabetes Paternal Grandmother     History  Substance Use Topics  . Smoking status: Never Smoker   . Smokeless tobacco: Not on file  . Alcohol Use: No    OB History    Grav Para Term Preterm Abortions TAB SAB Ect Mult Living                  Review of Systems  HENT: Positive for rhinorrhea.   Eyes: Positive for redness.  Respiratory: Positive for shortness of breath and wheezing.     All other systems reviewed and are negative.    Physical Exam  BP 150/89  Pulse 86  Temp(Src) 98.3 F (36.8 C) (Oral)  Resp 18  Wt 132 lb (59.875 kg)  SpO2 100%  Physical Exam  Constitutional: She is oriented to person, place, and time. She appears well-developed and well-nourished.  HENT:  Head: Normocephalic.  Eyes: Conjunctivae are normal. Pupils are equal, round, and reactive to light.  Neck: Normal range of motion. Neck supple.  Cardiovascular: Normal rate.   Pulmonary/Chest: Effort normal.  Abdominal: Soft. Bowel sounds are normal.  Musculoskeletal: Normal range of motion.  Neurological: She is alert and oriented to person, place, and time.  Skin: Skin is warm and dry.  Psychiatric: She has a normal mood and affect.    ED Course  Procedures  MDM  Pt is feeling much better.  I observed until 3 hours post epi.        Langston Masker, [place] 06/10/11 1756

## 2011-06-10 NOTE — ED Notes (Signed)
Pt reports has several environment allergies. Pt reports general symptoms to allergies. Pt was seen at Urgent Care prior to arrival here. Pt gave self epi-pen before coming here. Pt in NAD. Vital signs stable. Staff at Urgent Care gave pt Benadryl 50 mg PO, Solu-medrol 125 IVP, Zyrtec 10 mg PO, Zantac 300 mg PO. IV placed by Urgent Care in Right AC 20G with NS hanging.

## 2011-06-17 ENCOUNTER — Encounter (HOSPITAL_BASED_OUTPATIENT_CLINIC_OR_DEPARTMENT_OTHER): Payer: Self-pay | Admitting: *Deleted

## 2011-06-17 ENCOUNTER — Emergency Department (HOSPITAL_BASED_OUTPATIENT_CLINIC_OR_DEPARTMENT_OTHER)
Admission: EM | Admit: 2011-06-17 | Discharge: 2011-06-18 | Disposition: A | Discharge status: Home / Self Care | Payer: Managed Care, Other (non HMO) | Attending: Emergency Medicine | Admitting: Emergency Medicine

## 2011-06-17 DIAGNOSIS — E039 Hypothyroidism, unspecified: Secondary | ICD-10-CM | POA: Insufficient documentation

## 2011-06-17 DIAGNOSIS — I10 Essential (primary) hypertension: Secondary | ICD-10-CM | POA: Insufficient documentation

## 2011-06-17 DIAGNOSIS — J45901 Unspecified asthma with (acute) exacerbation: Secondary | ICD-10-CM | POA: Insufficient documentation

## 2011-06-17 DIAGNOSIS — K219 Gastro-esophageal reflux disease without esophagitis: Secondary | ICD-10-CM | POA: Insufficient documentation

## 2011-06-17 DIAGNOSIS — E7800 Pure hypercholesterolemia, unspecified: Secondary | ICD-10-CM | POA: Insufficient documentation

## 2011-06-17 DIAGNOSIS — T7840XA Allergy, unspecified, initial encounter: Secondary | ICD-10-CM

## 2011-06-17 MED ORDER — METHYLPREDNISOLONE SODIUM SUCC 125 MG IJ SOLR
125.0000 mg | Freq: Once | INTRAMUSCULAR | Status: AC
  Administered 2011-06-17: 125 mg via INTRAVENOUS
  Filled 2011-06-17: qty 2

## 2011-06-17 MED ORDER — DIPHENHYDRAMINE HCL 50 MG/ML IJ SOLN
25.0000 mg | Freq: Once | INTRAMUSCULAR | Status: AC
Start: 2011-06-17 — End: 2011-06-17
  Administered 2011-06-17: 25 mg via INTRAVENOUS
  Filled 2011-06-17: qty 1

## 2011-06-17 MED ORDER — FAMOTIDINE IN NACL 20-0.9 MG/50ML-% IV SOLN
20.0000 mg | Freq: Once | INTRAVENOUS | Status: AC
  Administered 2011-06-17: 20 mg via INTRAVENOUS
  Filled 2011-06-17: qty 50

## 2011-06-17 NOTE — ED Notes (Signed)
Per patient she was at church and started having an allergic reaction to perfume and the smell of the new carpet, injected herself with epi pen in L thigh, no respiratory issues at this time, no facial swelling

## 2011-06-18 ENCOUNTER — Encounter (HOSPITAL_BASED_OUTPATIENT_CLINIC_OR_DEPARTMENT_OTHER): Payer: Self-pay | Admitting: Emergency Medicine

## 2011-06-18 MED ORDER — PREDNISONE 10 MG PO TABS: 50.0000 mg | ORAL_TABLET | Freq: Every day | ORAL | Status: AC

## 2011-06-18 NOTE — ED Provider Notes (Signed)
History     Chief Complaint  Patient presents with  . Allergic Reaction   Patient is a 50 y.o. female presenting with allergic reaction. The history is provided by the patient. No language interpreter was used.  Allergic Reaction The primary symptoms do not include wheezing, cough, abdominal pain, nausea, vomiting, diarrhea, dizziness, palpitations, altered mental status, rash, angioedema or urticaria. Primary symptoms comment: throat tightening The current episode started 1 to 2 hours ago. The problem has been gradually improving. This is a recurrent problem.  Associated with: perfume exposure in a patient with multiple allergies seen by allergy and immunology. Significant symptoms that are not present include eye redness, flushing, rhinorrhea or itching.  Patient was at church and exposed to perfume and had throat tightening and then used epi pen.  This has happened before.  Seen most recently on 06/10/11 for same  Past Medical History  Diagnosis Date  . Asthma   . Hypercholesteremia   . Hypertension   . Reflux   . Hypothyroidism   . Gastroparesis   . Thyroid cancer     papillary  . VAIN (vaginal intraepithelial neoplasia)   . GERD (gastroesophageal reflux disease)   . Thyroid cancer     Past Surgical History  Procedure Date  . Breast surgery     reduc tion mammoplasty  . Cosmetic surgery     buttock liposuction  . Vaginal hysterectomy   . Thyroid surgery     for thyroid cancer  . Nissen fundoplication     REFLUX  . Hernia repair     Family History  Problem Relation Age of Onset  . Heart disease Mother   . Hypertension Mother   . Heart disease Father   . Hypertension Father   . Heart disease Sister   . Hypertension Sister   . Diabetes Paternal Grandmother     History  Substance Use Topics  . Smoking status: Never Smoker   . Smokeless tobacco: Not on file  . Alcohol Use: No    OB History    Grav Para Term Preterm Abortions TAB SAB Ect Mult Living           Review of Systems  Constitutional: Negative for activity change.  HENT: Negative for facial swelling, rhinorrhea, neck pain and neck stiffness.   Eyes: Negative for discharge, redness and itching.  Respiratory: Negative for apnea, cough, choking, chest tightness, wheezing and stridor.   Cardiovascular: Negative for chest pain and palpitations.  Gastrointestinal: Negative for nausea, vomiting, abdominal pain, diarrhea and abdominal distention.  Genitourinary: Negative for difficulty urinating.  Musculoskeletal: Negative for arthralgias.  Skin: Negative.  Negative for flushing, itching and rash.  Neurological: Negative for dizziness.  Hematological: Negative for adenopathy.  Psychiatric/Behavioral: Negative for agitation and altered mental status.    Physical Exam  BP 132/79  Pulse 78  Temp(Src) 98.1 F (36.7 C) (Oral)  Resp 18  SpO2 99%  Physical Exam  Constitutional: She is oriented to person, place, and time. She appears well-developed and well-nourished. No distress.  HENT:  Head: Normocephalic and atraumatic. Not macrocephalic. No trismus in the jaw.  Mouth/Throat: No uvula swelling. No oropharyngeal exudate, posterior oropharyngeal edema, posterior oropharyngeal erythema or tonsillar abscesses.       No angioedema  Eyes: EOM are normal. Pupils are equal, round, and reactive to light. Right eye exhibits no discharge. Left eye exhibits no discharge.  Neck: Normal range of motion. Neck supple. No JVD present. No tracheal deviation present.  Cardiovascular: Normal  rate and regular rhythm.   Pulmonary/Chest: Effort normal and breath sounds normal. No stridor. No respiratory distress. She has no wheezes. She has no rales.  Abdominal: Soft. Bowel sounds are normal. She exhibits no distension. There is no tenderness.  Musculoskeletal: Normal range of motion. She exhibits no edema.  Lymphadenopathy:    She has no cervical adenopathy.  Neurological: She is alert and  oriented to person, place, and time.       Normal speech  Skin: Skin is warm and dry.  Psychiatric: She has a normal mood and affect.    ED Course  Procedures  MDM Patient feels 100% improved.  Patient instructed to return for further throat tightness, hives, SOB or any concerns and follow up with her allergist in the am.  Patient verbalizes understanding and agrees to follow up      Latiesha Harada Smitty Cords, MD 06/18/11 1610

## 2011-07-16 ENCOUNTER — Ambulatory Visit (INDEPENDENT_AMBULATORY_CARE_PROVIDER_SITE_OTHER): Payer: Managed Care, Other (non HMO) | Admitting: General Surgery

## 2011-07-16 ENCOUNTER — Encounter (INDEPENDENT_AMBULATORY_CARE_PROVIDER_SITE_OTHER): Payer: Self-pay | Admitting: General Surgery

## 2011-07-16 VITALS — BP 123/78 | HR 63 | Temp 98.6°F | Ht 64.0 in | Wt 147.2 lb

## 2011-07-16 DIAGNOSIS — K219 Gastro-esophageal reflux disease without esophagitis: Secondary | ICD-10-CM

## 2011-07-16 NOTE — Patient Instructions (Signed)
We will discuss by phone your x-ray results and discussion with Dr. Elnoria Howard. Likely scheduling surgery following this.

## 2011-07-16 NOTE — Progress Notes (Signed)
Subjective:   Heartburn, reflux  Patient ID: Michelle [name], female   DOB: 1961-09-21, 50 y.o.   MRN: [id number]  HPI Patient is a 50 year old female here through the courtesy of Dr. hung for evaluation for possible redo anti-reflux surgery. The patient is status post laparoscopic Nissen fundoplication and hiatal hernia repair by me in 2005. At that time she had typical reflux and heartburn symptoms. The symptoms were resolved after her surgery for a number of years and she was able to get off of all her acid reduction medication. She had some temporary dysphagia postoperatively that resolved. She was evaluated for bloating postoperatively by Dr. Carman Ching and was found to have some moderate gastropexy since on a gastric emptying study in 2006. New  For about the last 2 years she has had increasing reflux and heartburn symptoms. These have steadily worsened. She describes typical substernal burning as well as water brash and episodes of regurgitation and hot fluid in her nose and throat. She has gradually gotten back onto medications and now is on maximum doses of Prevacid as well as Carafate and Mylanta. These improved but did not eliminate her symptoms. She has also had increasing problems with allergies and upper respiratory symptoms. There has been some question of whether this has been exacerbated by her reflux. Due to her gastric parents and she was tried on Reglan and some years ago but was not able to tolerate this medication. She states her diet is extremely limited due to her reflux. She does not have dysphagia.  She has had a recent workup by Dr. Elnoria Howard.This has included upper endoscopy showing an apparent 2-3 cm recurrent hiatal hernia and some possible mild but not severe esophagitis. She has also had pH monitoring and has an elevated DeMeester score of 38. There was high association of her symptoms and reflux.  Review of Systems  Constitutional: Positive for appetite change and fatigue.    HENT: Positive for congestion, rhinorrhea and sneezing.   Respiratory: Positive for cough, chest tightness, shortness of breath and wheezing.   Cardiovascular: Negative for chest pain and palpitations.  Gastrointestinal: Positive for vomiting, abdominal pain, diarrhea and abdominal distention. Negative for blood in stool.  Genitourinary: Negative.        Objective:   Physical Exam General: Alert normal weight Caucasian female in no distress Skin: Warm and dry without rash or infection HEENT: No palpable masses in the thyroid bed. Sclera nonicteric. Pupils equal and reactive. Oropharynx clear. Lungs: Clear without wheezing or increased work of breathing Cardiovascular: Regular rate and rhythm. No JVD or edema. Abdomen: Nondistended. Well-healed laparoscopic incisions. A mild diffuse tenderness. No discernible masses or organomegaly. Extremities: No joint swelling or deformity Neurologic: Alert and fully oriented. Gait normal.    Assessment:     A 50 year old female with apparent recurrent severe gastroesophageal reflux disease. This has been confirmed by endoscopy and pH monitoring. I discussed with the patient and her husband today but I believe she does have indications to go ahead with redo anti-reflux surgery. We discussed that the surgery is much more difficult than an initial operation and the risks such as bleeding, infection, and esophageal or gastric injury are much higher. However she feels her symptoms really are intolerable. We also discussed that to what degree she has gastroparesis could interfere with her success. I will plan to discuss these issues with Dr. Elnoria Howard. Also we'll obtain an upper GI series to more fully evaluate her anatomy. I will be back in touch  with her following these studies and likely plan to schedule surgery after this.    Plan:     Will get an upper GI series and likely plan redo laparoscopic Nissen fundoplication and hiatal hernia repair.

## 2011-07-20 ENCOUNTER — Emergency Department (HOSPITAL_BASED_OUTPATIENT_CLINIC_OR_DEPARTMENT_OTHER)
Admission: EM | Admit: 2011-07-20 | Discharge: 2011-07-20 | Disposition: A | Discharge status: Home / Self Care | Payer: Managed Care, Other (non HMO) | Attending: Emergency Medicine | Admitting: Emergency Medicine

## 2011-07-20 ENCOUNTER — Encounter (HOSPITAL_BASED_OUTPATIENT_CLINIC_OR_DEPARTMENT_OTHER): Payer: Self-pay | Admitting: *Deleted

## 2011-07-20 DIAGNOSIS — J45909 Unspecified asthma, uncomplicated: Secondary | ICD-10-CM | POA: Insufficient documentation

## 2011-07-20 DIAGNOSIS — R0602 Shortness of breath: Secondary | ICD-10-CM | POA: Insufficient documentation

## 2011-07-20 DIAGNOSIS — R42 Dizziness and giddiness: Secondary | ICD-10-CM | POA: Insufficient documentation

## 2011-07-20 DIAGNOSIS — K219 Gastro-esophageal reflux disease without esophagitis: Secondary | ICD-10-CM | POA: Insufficient documentation

## 2011-07-20 DIAGNOSIS — I10 Essential (primary) hypertension: Secondary | ICD-10-CM | POA: Insufficient documentation

## 2011-07-20 DIAGNOSIS — Z85850 Personal history of malignant neoplasm of thyroid: Secondary | ICD-10-CM | POA: Insufficient documentation

## 2011-07-20 DIAGNOSIS — R6889 Other general symptoms and signs: Secondary | ICD-10-CM | POA: Insufficient documentation

## 2011-07-20 DIAGNOSIS — T7840XA Allergy, unspecified, initial encounter: Secondary | ICD-10-CM

## 2011-07-20 DIAGNOSIS — Z79899 Other long term (current) drug therapy: Secondary | ICD-10-CM | POA: Insufficient documentation

## 2011-07-20 DIAGNOSIS — E7800 Pure hypercholesterolemia, unspecified: Secondary | ICD-10-CM | POA: Insufficient documentation

## 2011-07-20 MED ORDER — PREDNISONE 20 MG PO TABS: 60.0000 mg | ORAL_TABLET | Freq: Every day | ORAL | Status: AC

## 2011-07-20 MED ORDER — DIPHENHYDRAMINE HCL 25 MG PO TABS: 25.0000 mg | ORAL_TABLET | Freq: Four times a day (QID) | ORAL | Status: DC | PRN

## 2011-07-20 MED ORDER — EPINEPHRINE 0.3 MG/0.3ML IJ DEVI: 0.3000 mg | INTRAMUSCULAR | Status: DC | PRN

## 2011-07-20 NOTE — ED Notes (Signed)
Pt. Stated she was mowing her yard and does not recall being biten or stung, pt. States that allergic reactions can be delayed & she was around her sister who smokes & was in contact with her prior to mowing, pt. Reports a hx. Of allergic reaction when mowing in the past, hx of multiple allergic reactions, Pt. States, "Most of my allergies are from things that can be inhaled."

## 2011-07-20 NOTE — ED Provider Notes (Signed)
Scribed for [employer]. Bernette Mayers, MD, the patient was seen in room 12. This chart was scribed by Jannette Fogo. This patient's care was started at 15:52.   CSN: [id number] Arrival date & time: 07/20/2011  3:53 PM  Chief Complaint  Patient presents with  . Allergic Reaction   HPI Michelle [name] is a 50 y.o. female brought in by ambulance, who presents to the Emergency Department for shortness of breath secondary to an allergic reaction starting 35 minutes prior to arrival. Patient states she was cutting the grass at her sister's house then suddenly developed shortness of breath and a throat closing sensation. She gave herself an EpiPen injection and symptoms improved. As she was driving to the ED, the symptoms came back. She gave herself a second EpiPen injection and called 911. Upon medic arrival, patient was hypoxic with an oxygen saturation of 82% on room air. En route she was given Benadryl, Zantac, Solumedrol, and a Nebulizer treatment by medics. Patient reports associated dizziness but denies any syncope, loss of consciousness, or rash. Currently she feels improved but a mild throat tightness sensation persists. Patient has a history of similar symptoms, she was seen in the ED on 06/18/11 and 06/10/11 for same. There are no other associated symptoms and no other alleviating or aggravating factors.     Past Medical History  Diagnosis Date  . Asthma   . Hypercholesteremia   . Hypertension   . Reflux   . Hypothyroidism   . Gastroparesis   . Thyroid cancer     papillary  . VAIN (vaginal intraepithelial neoplasia)   . GERD (gastroesophageal reflux disease)   . Thyroid cancer   . Thyroid cancer     Past Surgical History  Procedure Date  . Breast surgery     reduc tion mammoplasty  . Cosmetic surgery     buttock liposuction  . Vaginal hysterectomy   . Thyroid surgery     for thyroid cancer  . Nissen fundoplication     REFLUX  . Hernia repair    MEDICATIONS:  Previous Medications     ALBUTEROL (PROVENTIL) (2.5 MG/3ML) 0.083% NEBULIZER SOLUTION    Take 5 mg by nebulization once.     ALBUTEROL (PROVENTIL,VENTOLIN) 90 MCG/ACT INHALER    Inhale 3-6 puffs into the lungs as needed. Shortness of breath    ALPRAZOLAM (XANAX) 1 MG TABLET    Take 1 mg by mouth at bedtime.     ASPIRIN EC 81 MG TABLET    Take 81 mg by mouth daily.     CETIRIZINE (ZYRTEC) 10 MG TABLET    Take 10 mg by mouth daily.     DIPHENHYDRAMINE (BENADRYL ALLERGY CHILDRENS) 12.5 MG CHEWABLE TABLET    Chew 25-100 mg by mouth as needed. Seasonal allergies    DIPHENHYDRAMINE (BENADRYL) 50 MG/ML INJECTION    Inject 50 mg into the vein once.     EPINEPHRINE (EPI-PEN) 0.3 MG/0.3 ML DEVI    Inject 0.3 mg into the muscle once as needed. Allergic reaction   ESTRADIOL (VIVELLE-DOT) 0.1 MG/24HR    Place 1 patch onto the skin 2 (two) times a week. Change on Monday and Thursday   EZETIMIBE (ZETIA) 10 MG TABLET    Take 10 mg by mouth daily.    LANSOPRAZOLE (PREVACID SOLUTAB) 30 MG DISINTEGRATING TABLET    Take 60-120 mg by mouth 2 (two) times daily as needed. Acid reflux    LEVOTHYROXINE (SYNTHROID, LEVOTHROID) 100 MCG TABLET    Take 100  mcg by mouth daily.     LUBIPROSTONE (AMITIZA) 24 MCG CAPSULE    Take 24 mcg by mouth 2 (two) times daily as needed. Fluid   METHYLPREDNISOLONE SODIUM SUCCINATE (SOLU-MEDROL) 125 MG INJECTION    Inject 125 mg into the vein once.     METOPROLOL SUCCINATE (TOPROL-XL) 25 MG 24 HR TABLET    Take 25 mg by mouth daily.     ONDANSETRON (ZOFRAN-ODT) 8 MG DISINTEGRATING TABLET    Take 8 mg by mouth as needed. Nausea and vomiting    PREDNISONE (DELTASONE) 10 MG TABLET    Taper, 6,5,4,3,2,1   RANITIDINE (ZANTAC) 50 MG/2ML SOLN    Inject 50 mg into the vein once.     ROSUVASTATIN (CRESTOR) 20 MG TABLET    Take 20 mg by mouth daily.     TRAVOPROST, BENZALKONIUM, (TRAVATAN) 0.004 % OPHTHALMIC SOLUTION    1 drop at bedtime.     VITAMIN B-12 (CYANOCOBALAMIN) 1000 MCG TABLET    Take 1,000 mcg by mouth daily.       ALLERGIES:  Allergies as of 07/20/2011 - Review Complete 07/20/2011  Allergen Reaction Noted  . Bee Anaphylaxis 05/15/2011  . Pneumococcal vaccines Swelling 06/10/2011  . Tetanus toxoids Swelling 06/10/2011  . Wellbutrin (bupropion hcl) Other (See Comments) 06/10/2011  . Bactrim Hives and Other (See Comments) 05/15/2011  . Biaxin Itching and Other (See Comments) 05/15/2011  . Doxycycline Other (See Comments) 05/15/2011  . Hydone (chlorthalidone) Other (See Comments) 06/10/2011  . Ranitidine Other (See Comments) 06/10/2011  . Sulfa antibiotics Hives and Other (See Comments) 05/15/2011  . Tetracyclines & related Other (See Comments) 05/15/2011    Family History  Problem Relation Age of Onset  . Heart disease Mother   . Hypertension Mother   . Heart disease Father   . Hypertension Father   . Heart disease Sister   . Hypertension Sister   . Diabetes Paternal Grandmother   . Cancer Other    SOCIAL HISTORY: Brought in by ambulance History  Substance Use Topics  . Smoking status: Never Smoker   . Smokeless tobacco: Never Used  . Alcohol Use: No   Review of Systems  Constitutional: Negative for fever.  HENT:       Throat tightness sensation   Respiratory: Positive for shortness of breath.   Neurological: Positive for dizziness. Negative for syncope.  All other systems reviewed and are negative.    Physical Exam  BP 135/87  Pulse 98  Temp(Src) 98.4 F (36.9 C) (Oral)  Resp 22  Wt 145 lb (65.772 kg)  SpO2 100%  Physical Exam  Nursing note and vitals reviewed. Constitutional: She is oriented to person, place, and time. She appears well-developed and well-nourished. No distress.  HENT:  Head: Normocephalic and atraumatic.  Mouth/Throat: Oropharynx is clear and moist.  Eyes: Conjunctivae are normal. Pupils are equal, round, and reactive to light.  Neck: Normal range of motion. Neck supple. Carotid bruit is not present. No tracheal deviation present.   Cardiovascular: Regular rhythm, normal heart sounds and intact distal pulses.  Tachycardia present.   No murmur heard. Pulmonary/Chest: Effort normal and breath sounds normal. No stridor. No respiratory distress. She has no wheezes.  Abdominal: Soft. Bowel sounds are normal. She exhibits no distension.  Musculoskeletal: Normal range of motion. She exhibits no edema and no tenderness.  Neurological: She is alert and oriented to person, place, and time. She has normal strength. Coordination normal.  Skin: Skin is warm and dry. No rash noted.  Psychiatric: She has a normal mood and affect.    Procedures  OTHER DATA REVIEWED: Nursing notes, vital signs, and past medical records reviewed. Prior records reviewed and indicate: 06/18/11 - Seen in ED by Dr. Terressa Koyanagi for allergic reaction. Patient was at church and exposed to perfume and had throat tightening and then used epi pen. 06/10/11 - Seen in ED by PA Langston Masker for  allergic reaction   DIAGNOSTIC STUDIES: Oxygen Saturation is 100% on room air, normal by my interpretation. Patient was subsequently placed on 2L of supplemental oxygen via [place] and oxygen saturation remained stable at 100%.     15:52 - Patient evaluated by ED physician; P: 122, Oxygen saturation is 100% on 2L of [place]. ED physician discussed need for observation.  15:56 - Patient requesting water, tolerating PO fluids well 17:19 - Re-examined by ED physician, patient resting comfortably.  17:29 - Repeat Vitals: BP: 135/87, P:98, R:22, O2: 100% 19:33 - Re-examined by ED physician, patient feels improved. Breathing comfortably at this time, Oxygen saturation is 100% on room air.  TIME OUT: 19:40 on 07/20/2011   MDM: Allergic reaction, similar to previous with feeling of throat closing, took epi-pen x 2 prior to arrival. Given benadryl, zantac and solumedrol enroute. Feeling much better now. Observed for 4 hrs post epi, no recurrence of reaction.   IMPRESSION: allergic  reactions  PLAN: Discharge   CONDITION ON DISCHARGE: Stable and improved     SCRIBE ATTESTATION: I personally performed the services described in the documentation, which were scribed in my presence. The recorded information has been reviewed and considered.   SHELDON,CHARLES B.      Charles B. Bernette Mayers, MD 07/20/11 [phone]

## 2011-07-21 ENCOUNTER — Other Ambulatory Visit (INDEPENDENT_AMBULATORY_CARE_PROVIDER_SITE_OTHER): Payer: Self-pay | Admitting: General Surgery

## 2011-07-21 ENCOUNTER — Ambulatory Visit
Admission: RE | Admit: 2011-07-21 | Discharge: 2011-07-21 | Disposition: A | Discharge status: Home / Self Care | Payer: Managed Care, Other (non HMO) | Source: Ambulatory Visit | Attending: General Surgery | Admitting: General Surgery

## 2011-07-21 DIAGNOSIS — K219 Gastro-esophageal reflux disease without esophagitis: Secondary | ICD-10-CM

## 2011-07-21 MED FILL — Diphenhydramine HCl Inj 50 MG/ML: INTRAMUSCULAR | Qty: 1 | Status: AC

## 2011-07-21 MED FILL — Albuterol Sulfate Soln Nebu 0.5% (5 MG/ML): RESPIRATORY_TRACT | Qty: 0.5 | Status: AC

## 2011-07-22 ENCOUNTER — Telehealth (INDEPENDENT_AMBULATORY_CARE_PROVIDER_SITE_OTHER): Payer: Self-pay | Admitting: General Surgery

## 2011-07-22 NOTE — Telephone Encounter (Signed)
I have reviewed the upper GI series and discussed this with the patient. This shows an intact wrap but it appears to have herniated up through the diaphragm through a recurrent hiatal hernia. There is significant reflux during the exam.  I discussed all this with her. I think the indications for redo surgery are present. We again discussed the potential he difficult nature of redo surgery and somewhat increased risks of injury to the stomach or esophagus and postoperative problems such as dysphagia and scarring. However I think the proper course is to repair this for her. She agrees all her questions were answered and we will get this scheduled.

## 2011-07-29 ENCOUNTER — Encounter: Payer: Self-pay | Admitting: General Surgery

## 2011-07-30 ENCOUNTER — Telehealth: Payer: Self-pay | Admitting: Obstetrics and Gynecology

## 2011-07-30 NOTE — Telephone Encounter (Signed)
The patient called me last night stating she thought she had a urinary tract infection. Her symptom was vaginal burning. She did not have frequency of urination. She she said she was urinating less. She did not have dysuria. She was not having hematuria or fever. She was on prednisone for allergies. She was not on antibiotics. I told her that her symptoms were not that classical for UTI. She called me at 8 PM I suggested she go over and see Dr. Cleta Alberts whose her PCP whose office stays open to 9 PM. She said she didn't want bother him. We decided she would take Pyrdium  last night and come see Dr. Audie Box today.

## 2011-08-18 ENCOUNTER — Encounter (HOSPITAL_COMMUNITY): Payer: Managed Care, Other (non HMO)

## 2011-08-18 ENCOUNTER — Ambulatory Visit (HOSPITAL_COMMUNITY)
Admission: RE | Admit: 2011-08-18 | Discharge: 2011-08-18 | Disposition: A | Discharge status: Home / Self Care | Payer: Managed Care, Other (non HMO) | Source: Ambulatory Visit | Attending: General Surgery | Admitting: General Surgery

## 2011-08-18 ENCOUNTER — Other Ambulatory Visit (INDEPENDENT_AMBULATORY_CARE_PROVIDER_SITE_OTHER): Payer: Self-pay | Admitting: General Surgery

## 2011-08-18 DIAGNOSIS — Z01818 Encounter for other preprocedural examination: Secondary | ICD-10-CM

## 2011-08-18 LAB — CBC
Hemoglobin: 13.7 g/dL (ref 12.0–15.0)
Platelets: 267 10*3/uL (ref 150–400)
RBC: 4.48 MIL/uL (ref 3.87–5.11)
WBC: 9.7 10*3/uL (ref 4.0–10.5)

## 2011-08-18 LAB — BASIC METABOLIC PANEL
BUN: 8 mg/dL (ref 6–23)
Chloride: 103 mEq/L (ref 96–112)
Creatinine, Ser: 0.56 mg/dL (ref 0.50–1.10)
GFR calc Af Amer: >60 mL/min (ref >60)
GFR calc non Af Amer: >60 mL/min (ref >60)
Potassium: 3.8 mEq/L (ref 3.5–5.1)
Sodium: 139 mEq/L (ref 135–145)

## 2011-08-19 ENCOUNTER — Inpatient Hospital Stay (HOSPITAL_COMMUNITY)
Admission: RE | Admit: 2011-08-19 | Discharge: 2011-08-22 | DRG: 328 | Disposition: A | Discharge status: Home / Self Care | Payer: Managed Care, Other (non HMO) | Source: Ambulatory Visit | Attending: General Surgery | Admitting: General Surgery

## 2011-08-19 ENCOUNTER — Encounter: Payer: Managed Care, Other (non HMO) | Admitting: Gynecology

## 2011-08-19 DIAGNOSIS — Z01812 Encounter for preprocedural laboratory examination: Secondary | ICD-10-CM

## 2011-08-19 DIAGNOSIS — K219 Gastro-esophageal reflux disease without esophagitis: Secondary | ICD-10-CM | POA: Diagnosis present

## 2011-08-19 DIAGNOSIS — J45909 Unspecified asthma, uncomplicated: Secondary | ICD-10-CM | POA: Diagnosis present

## 2011-08-19 DIAGNOSIS — K449 Diaphragmatic hernia without obstruction or gangrene: Principal | ICD-10-CM | POA: Diagnosis present

## 2011-08-19 DIAGNOSIS — I10 Essential (primary) hypertension: Secondary | ICD-10-CM | POA: Diagnosis present

## 2011-08-19 DIAGNOSIS — Z01810 Encounter for preprocedural cardiovascular examination: Secondary | ICD-10-CM

## 2011-08-19 DIAGNOSIS — K210 Gastro-esophageal reflux disease with esophagitis: Secondary | ICD-10-CM

## 2011-08-19 DIAGNOSIS — R112 Nausea with vomiting, unspecified: Secondary | ICD-10-CM | POA: Diagnosis not present

## 2011-08-22 LAB — CBC
MCH: 30.4 pg (ref 26.0–34.0)
MCV: 93.2 fL (ref 78.0–100.0)
Platelets: 209 10*3/uL (ref 150–400)
RBC: 3.52 MIL/uL — ABNORMAL LOW (ref 3.87–5.11)
RDW: 13 % (ref 11.5–15.5)

## 2011-08-22 LAB — BASIC METABOLIC PANEL
CO2: 31 mEq/L (ref 19–32)
Calcium: 8.6 mg/dL (ref 8.4–10.5)
Creatinine, Ser: <0.47 mg/dL — ABNORMAL LOW (ref 0.50–1.10)

## 2011-08-24 NOTE — Op Note (Signed)
NAMEDELICIA, BERENS                ACCOUNT NO.:  [id number]  MEDICAL RECORD NO.:  [id number]  LOCATION:  1524                         FACILITY:  [facility]  PHYSICIAN:  Sharlet Salina T. Richey Doolittle, M.D.DATE OF BIRTH:  1961-04-12  DATE OF PROCEDURE:  08/19/2011 DATE OF DISCHARGE:                              OPERATIVE REPORT   PREOPERATIVE DIAGNOSIS:  Recurrent hiatal hernia with gastroesophageal reflux disease.  POSTOPERATIVE DIAGNOSIS:  Recurrent hiatal hernia with gastroesophageal reflux disease.  SURGICAL PROCEDURES:  Redo laparoscopic hiatal hernia repair and Nissen fundoplications.  SURGEON:  Lorne Skeens. Lamarr Feenstra, M.D.  ASSISTANT:  Abigail Miyamoto, M.D.  ANESTHESIA:  General.  BRIEF HISTORY:  Michelle [name] is a 50 year old female with a history of laparoscopic Nissen fundoplication 9 years ago for severe GERD.  She initially had good response to her surgery, but in the last couple of years, she has had gradually worsening recurrent reflux symptoms, which now are severe with ongoing heartburn water brash.  She has had a thorough evaluation including 24-hour pH monitoring showing the high DeMeester score and she has had an upper GI series showing apparent herniation of an intact wrap after recurrent hiatal hernia.  With these findings, due to the severity of her symptoms after discussion of options, we have elected proceed with laparoscopic redo of her hiatal hernia repair and Nissen fundoplication.  We discussed the nature of procedures, indications, difficulty with redo surgery, risks of bleeding, anesthetic complications, esophageal or gastric injury, dysphagia, and recurrent symptoms.  She is now brought to the operating room for this procedure.  DESCRIPTION OF OPERATION:  The patient was brought to the operating room, placed supine position on the operating table and general endotracheal anesthesia was induced.  PAS were placed.  The abdomen was widely sterilely prepped and  draped.  She received preoperative IV antibiotics.  Correct patient and procedure were verified.  Access was obtained the left upper quadrant with a 5 mm Optiview trocar without difficulty and pneumoperitoneum established.  There was no evidence of trocar injury.  Under direct vision, a 5 mm trocar was placed laterally in the right upper quadrant, 11 mm trocar in the right upper quadrant midclavicular line, 11 mm trocar just above the left umbilicus for the camera port, and additional 5 mm trocar in the left flank.  Through a 5 mm subxiphoid site, the [facility] retractor was placed in the left lobe of liver elevated.  There were no adhesions to the liver and was good exposure of the upper stomach and hiatus.  The patient was placed in deep reverse Trendelenburg.  Adhesions around the hiatus to the wrap were then carefully taken down beginning anteriorly and laterally on either side.  The anterior hiatus and the anterior crura either side were defined and dissected.  The wrap was clearly vision was visualized and as seen in the upper GI series was intact, but she did have a recurrent hiatal hernia and some herniation of the wrap particular on the right side up into the hiatus.  The adhesions were not particularly severe considering her previous surgery if we mobilized the wrap up out of the hiatus.  We dissected superiorly and came down to the more  proximal esophagus with essentially no adhesions.  The wrap was mobilized down off both crura and wrapped carefully dissected away from pledgets along the right posterior crura under direct vision.  At this point, the wrap was easily seen and they were actually minimal adhesions behind the wrap to the esophagus and the fundoplication sutures were all taken down and the wrap mobilized off the anterolateral esophagus bilaterally.  Further adhesions posteriorly along the crural repair and pledgets were intent, but we could visualize these well and the  wrap was dissected off these until it mobilize the crura posteriorly.  At this point, the wrap could be passed back beneath the esophagus from the right to the left and we had a Penrose drain around the distal esophagus, which was elevated and few further adhesions of the wrap to the posterior crura and the pledgets were taken down and then the wrap completely mobilized back to the patient's left and the hiatus completely dissected defined.  There was plenty esophageal length with at least 5 cm or 6 cm of the esophagus lying in the abdomen under no tension.  The wrap had been completely taken down and mobilized.  It could be seen that the posterior crural repair with pledgets was essentially intact, but the hiatus had dilated above this.  Following this complete dissection, a posterior crural repair was done with 2 further 0-Ethibond pledgeted sutures further closing the posterior crura down to anatomic normal size.  After the crural closure, we passed a lighted 15 bougie dilator down through the esophagus, which almost filled the hiatal opening, but was not too snug.  The dilator was pulled back in the efforts upper esophagus.  I then used a 7 x 10 cm Surgisis soft tissue hiatal patch, which was moistened, introduced to the abdomen, and oriented with the solid side over the posterior crural repair and the U shaped opening up around the esophagus anteriorly. This patch was then sutured circumferentially to the diaphragm with interrupted 3-0 Vicryl sutures and anteriorly was sutured to itself to close the slit around the esophagus.  This provided nice broad coverage of the hiatus crural repair and surrounding diaphragm.  At this point, a piece of mobile fundus was chosen and passed posterior to the esophagus and a continuous piece of fundus was found on the left side to bring up for the wrap.  Again, we then complete dissection and mobilization of the fundus, but as we rewrapped was clear  that the fundoplication sutures were going to be essentially in the same part and appropriate part of the fundus that had been previously.  Prior to perform the fundoplication, the 50 lighted bougie dilator was passed down into the stomach and then over the dilator a 360 degree wrap was performed with interrupted 2-0 Ethibond sutures with the most superior suture incorporating the phrenoesophageal ligament and a portion of the Surgisis mesh as well.  A 2.5 to 3 cm wrap with 4 sutures was performed. Following this, the dilator was removed and the wrap was seen to be nice and floppy.  The operative site was irrigated.  There was no evidence of bleeding or gastroesophageal injury.  The Nathan retractor was then removed and all CO2 was evacuated.  Trocars were removed under direct vision.  Skin incisions were closed with subcuticular Monocryl and Dermabond.  Sponge and needle counts were correct.  The patient taken to the Recovery in good condition.     Lorne Skeens. Sylvi Rybolt, M.D.     Tory Emerald  D:  08/19/2011  T:  08/20/2011  Job:  [id number]  cc:   Jordan Hawks. Elnoria Howard, MD Fax: [phone]  Electronically Signed by Glenna Fellows M.D. on 08/24/2011 01:37:51 PM

## 2011-08-25 ENCOUNTER — Telehealth (INDEPENDENT_AMBULATORY_CARE_PROVIDER_SITE_OTHER): Payer: Self-pay

## 2011-08-25 NOTE — Telephone Encounter (Signed)
Spoke with pt several times today.  Her sister was concerned about one of the operative sites on her abdomen - some yellow liquid oozing.  The pt states no redness or increased pain, but her sister did not like the way it looked.  I offered her an appt with Dr. Johna Sheriff on 10/4 which she declined.  I advised her to monitor the site and let us know tomorrow if any changes.

## 2011-09-01 NOTE — Discharge Summary (Signed)
  NAMEANTHONELLA, KLAUSNER                ACCOUNT NO.:  [id number]  MEDICAL RECORD NO.:  [id number]  LOCATION:  1524                         FACILITY:  [facility]  PHYSICIAN:  Sharlet Salina T. Tilda Samudio, M.D.DATE OF BIRTH:  1961/04/05  DATE OF ADMISSION:  08/19/2011 DATE OF DISCHARGE:  08/22/2011                              DISCHARGE SUMMARY   DISCHARGE DIAGNOSES:  Recurrent hiatal hernia and reflux.  OPERATIONS AND PROCEDURES:  Redo laparoscopic Nissen fundoplication and hiatal hernia repair on August 19, 2011.  HISTORY OF PRESENT ILLNESS:  The patient is a 50 year old female, approximately 10 years following laparoscopic Nissen fundoplication and hiatal hernia repair for typical reflux.  She responded well to the surgery with complete relief of her symptoms, but now has 2 years of gradually worsening recurrent heartburn, reflux, water brash, and nighttime regurgitation.  She had a thorough preoperative workup by Dr. Elnoria Howard, including 24-hour esophageal pH monitoring with good correlation of symptoms and she has had an upper GI series showing a partial herniation of her intact wrap through a recurrent hiatal hernia.  After extensive preoperative workup discussion, the patient was admitted for redo laparoscopic repair of a hiatal hernia and Nissen fundoplication.  PAST MEDICAL HISTORY:  Surgery significant mainly as above and she is followed medically for asthma and joint pain.  MEDICATIONS:  EpiPen p.r.n., Zofran, Voltaren, Carafate, and albuterol.  ALLERGIES:  Multiple.  See med rec sheet.  PERTINENT PHYSICAL EXAMINATION:  GENERAL:  She appears healthy. ABDOMEN:  Soft and nontender, abdomen well-healed laparoscopic incisions.  HOSPITAL COURSE:  The patient was admitted on morning for procedure and underwent uneventful laparoscopic redo hiatal hernia repair with findings for moderate-sized recurrent hiatal hernia.  She underwent redo Nissen, redo repair of hiatal hernia with synthetic  biomaterial reinforcement.  Postoperatively, her course was uncomplicated.  On the first postop day, she had some shoulder, epigastric pain, and nausea, but was stable.  Her nausea persisted for about 48 hours, partially controlled with medications.  By September 29, she was feeling much better with resolved nausea, positive flatus, and was started on liquid diet, which she tolerated well.  She was discharged home on September 29.  MEDICATIONS:  Same as admission plus Roxicet elixir for pain.  She is on a full liquid diet.  FOLLOWUP:  To be in my office in 2 weeks.     Lorne Skeens. Josiel Gahm, M.D.     Tory Emerald  D:  08/31/2011  T:  09/01/2011  Job:  [id number]  cc:   Jordan Hawks. Elnoria Howard, MD Fax: [phone]  Electronically Signed by Glenna Fellows M.D. on 09/01/2011 11:48:18 AM

## 2011-09-01 NOTE — H&P (Signed)
  NAMEJALA, DUNDON                ACCOUNT NO.:  [id number]  MEDICAL RECORD NO.:  [id number]  LOCATION:                               FACILITY:  [facility]  PHYSICIAN:  Lorne Skeens. Lisaanne Lawrie, M.D.DATE OF BIRTH:  [birth year]  DATE OF ADMISSION:  08/22/2011 DATE OF DISCHARGE:                             HISTORY & PHYSICAL   CHIEF COMPLAINT:  Heartburn and reflux.  HISTORY:  Patient is a 50 year old female with history of laparoscopic Nissen fundoplication and hiatal hernia repair in 2005.  She had typical heartburn and reflux symptoms that resolved after surgery.  She has had 2 years of gradually worsening recurrent symptoms of substernal burning, water brash, and episodes of regurgitation.  She has had a thorough workup including endoscopy, upper GI series, and esophageal manometry, which indicates symptomatic reflux and a moderate-sized recurrent hiatal hernia with intact wrap.  After extensive discussion, preoperative workup as an outpatient, she is admitted for redo laparoscopic Nissen fundoplication.  PAST MEDICAL HISTORY:  Generally unremarkable except as above.  She has some mild asthma and joint pain.  MEDICATIONS:  EpiPen p.r.n., Zofran p.r.n., Voltaren, Carafate, and albuterol.  ALLERGIES:  Multiple.  See separate med rec form.  PERTINENT PHYSICAL EXAMINATION:  GENERAL:  She is well-developed female, in no distress. ABDOMEN:  Soft and nontender with well-healed laparoscopic incisions. No hernias.  ASSESSMENT AND PLAN:  Recurrent hiatal hernia with gastroesophageal reflux disease and reflux.  The patient is admitted for redo laparoscopic Nissen fundoplication and hiatal hernia repair.     Lorne Skeens. Thelton Graca, M.D.     Tory Emerald  D:  08/31/2011  T:  09/01/2011  Job:  [id number]  Electronically Signed by Glenna Fellows M.D. on 09/01/2011 11:48:26 AM

## 2011-09-02 ENCOUNTER — Other Ambulatory Visit: Payer: Self-pay | Admitting: Gynecology

## 2011-09-03 ENCOUNTER — Ambulatory Visit (INDEPENDENT_AMBULATORY_CARE_PROVIDER_SITE_OTHER): Payer: Managed Care, Other (non HMO) | Admitting: General Surgery

## 2011-09-03 VITALS — BP 112/70 | HR 64 | Temp 97.2°F | Resp 14 | Ht 64.0 in | Wt 143.2 lb

## 2011-09-03 DIAGNOSIS — Z09 Encounter for follow-up examination after completed treatment for conditions other than malignant neoplasm: Secondary | ICD-10-CM

## 2011-09-03 NOTE — Patient Instructions (Signed)
Slowly add a solid foods as tolerated

## 2011-09-03 NOTE — Progress Notes (Signed)
Patient returns approximately 3 weeks following redo hiatal hernia repair and Nissen fundoplication laparoscopically. She is really getting along quite well. She does still have some dysphagia to solid foods and is able to only very small bites and some foods she is not able to tolerate such as bread. Liquids are fine. She is taking only an occasional Zantac.She feels that her reflux, water brash and heartburn are essentially completely relieved.  On examination she appears well. Her abdomen is soft and nontender and wounds well healed.  Assessment and plan: Doing well following laparoscopic redo hiatal hernia repair and Nissen fundoplication. She will gradually try to add solid food. I think her dysphagia at this early stage is expected. I'll see her back in 6 weeks.

## 2011-10-05 ENCOUNTER — Encounter: Payer: Managed Care, Other (non HMO) | Admitting: Gynecology

## 2011-10-08 ENCOUNTER — Other Ambulatory Visit (HOSPITAL_COMMUNITY)
Admission: RE | Admit: 2011-10-08 | Discharge: 2011-10-08 | Disposition: A | Discharge status: Home / Self Care | Payer: Managed Care, Other (non HMO) | Source: Ambulatory Visit | Attending: Gynecology | Admitting: Gynecology

## 2011-10-08 ENCOUNTER — Ambulatory Visit (INDEPENDENT_AMBULATORY_CARE_PROVIDER_SITE_OTHER): Payer: Managed Care, Other (non HMO) | Admitting: Gynecology

## 2011-10-08 ENCOUNTER — Encounter: Payer: Managed Care, Other (non HMO) | Admitting: Gynecology

## 2011-10-08 ENCOUNTER — Encounter: Payer: Self-pay | Admitting: Gynecology

## 2011-10-08 VITALS — BP 128/70 | Ht 64.0 in | Wt 136.0 lb

## 2011-10-08 DIAGNOSIS — N893 Dysplasia of vagina, unspecified: Secondary | ICD-10-CM | POA: Insufficient documentation

## 2011-10-08 DIAGNOSIS — M8580 Other specified disorders of bone density and structure, unspecified site: Secondary | ICD-10-CM

## 2011-10-08 DIAGNOSIS — E039 Hypothyroidism, unspecified: Secondary | ICD-10-CM | POA: Insufficient documentation

## 2011-10-08 DIAGNOSIS — R000 Tachycardia, unspecified: Secondary | ICD-10-CM | POA: Insufficient documentation

## 2011-10-08 DIAGNOSIS — N898 Other specified noninflammatory disorders of vagina: Secondary | ICD-10-CM

## 2011-10-08 DIAGNOSIS — Z01419 Encounter for gynecological examination (general) (routine) without abnormal findings: Secondary | ICD-10-CM

## 2011-10-08 DIAGNOSIS — E7800 Pure hypercholesterolemia, unspecified: Secondary | ICD-10-CM | POA: Insufficient documentation

## 2011-10-08 DIAGNOSIS — J45909 Unspecified asthma, uncomplicated: Secondary | ICD-10-CM | POA: Insufficient documentation

## 2011-10-08 DIAGNOSIS — M949 Disorder of cartilage, unspecified: Secondary | ICD-10-CM

## 2011-10-08 DIAGNOSIS — Z85850 Personal history of malignant neoplasm of thyroid: Secondary | ICD-10-CM | POA: Insufficient documentation

## 2011-10-08 DIAGNOSIS — K3184 Gastroparesis: Secondary | ICD-10-CM | POA: Insufficient documentation

## 2011-10-08 MED ORDER — ESTRADIOL 0.1 MG/24HR TD PTTW: 1.0000 | MEDICATED_PATCH | TRANSDERMAL | Status: DC

## 2011-10-08 NOTE — Progress Notes (Signed)
Michelle [name] 06-06-1961 [id number]        50 y.o.  for annual exam.  Overall doing well. She has a history of low-grade dysplasia of the vagina that we've been following expectantly. She is on ERT of Vivelle dot 0.1 mg patch is doing well wants to continue this.   When she is late changing her patch she becomes symptomatic.  Past medical history,surgical history, medications, allergies, family history and social history were all reviewed and documented in the EPIC chart. ROS:  Was performed and pertinent positives and negatives are included in the history.  Exam: chaperone present Vitals per intake General appearance  Normal Skin grossly normal Head/Neck normal with no cervical or supraclavicular adenopathy thyroid normal Lungs  clear Cardiac RR, without RMG Abdominal  soft, nontender, without masses, organomegaly or hernia well healed laparoscopic scars Breasts  examined lying and sitting without masses, retractions, discharge or axillary adenopathy.  Well-healed reduction scars bilaterally Pelvic  Ext/BUS/vagina  normal Pap of cuff done  Adnexa  Without masses or tenderness    Anus and perineum  normal   Rectovaginal  normal sphincter tone without palpated masses or tenderness.    Assessment/Plan:  50 y.o. female for annual exam.    1. ERT. Patient is on Vivelle dot 0.1 mg patch is doing well. I again discussed the WHI study increased risk of stroke heart attack DVT possible increased risk of breast cancer all reviewed. The ACOG and NAMS statements for lowest dose for shortness period of time were also reviewed. Patient was to continue she accepts the risks I refilled her times a year. 2. VAIN.  Patient has history of low-grade VAIN status post colposcopy and biopsy showing low-grade changes September 2011. Her most recent Pap smear June 2012 showed ASCUS. 2 Pap smear done today we'll plan follow up in 6 months and to have several normals before discontinuing. 3. Breast health. SBE  monthly reviewed. Patient overdue for mammogram was last mammogram 2 years ago. Strongly recommended she schedule this and she agrees to do so. 4. Osteopenia. Bone density 2 years ago showed T score -1.9. We'll go ahead and repeat now a two-year interval. Increase calcium vitamin D reviewed. 5. Health maintenance. Dr. Earl [name] follows the patient for her routine health maintenance in follow up of her medical issues. No blood work was done today this is all done through his office. Will plan colonoscopy. Assuming she continues well from a gynecologic standpoint she'll see me in a year sooner as needed.    Dara Lords MD, 3:33 PM 10/08/2011

## 2011-10-09 ENCOUNTER — Encounter: Payer: Self-pay | Admitting: Gynecology

## 2011-10-09 ENCOUNTER — Encounter (INDEPENDENT_AMBULATORY_CARE_PROVIDER_SITE_OTHER): Payer: Managed Care, Other (non HMO) | Admitting: General Surgery

## 2011-10-13 ENCOUNTER — Encounter: Payer: Managed Care, Other (non HMO) | Admitting: Gynecology

## 2011-10-20 ENCOUNTER — Telehealth: Payer: Self-pay | Admitting: *Deleted

## 2011-10-20 NOTE — Telephone Encounter (Signed)
SPOKE WITH PT REGARDING THE BELOW NOTE

## 2011-10-20 NOTE — Telephone Encounter (Signed)
Message copied by Aura Camps on Tue Oct 20, 2011 11:20 AM ------      Message from: Dara Lords      Created: Tue Oct 20, 2011 10:24 AM       Tell patient that her Pap smear showed low-grade atypia as it has in the past. We will repeat her Pap smear in 6 months as I recommended. If the atypia persistent I will want to repeat look with the colposcopy. At this point I just want to repeat the Pap smear in 6 months.

## 2011-10-22 ENCOUNTER — Encounter (INDEPENDENT_AMBULATORY_CARE_PROVIDER_SITE_OTHER): Payer: Self-pay | Admitting: General Surgery

## 2011-10-23 ENCOUNTER — Telehealth: Payer: Self-pay | Admitting: *Deleted

## 2011-10-23 NOTE — Telephone Encounter (Signed)
Patient's Pap smear returned as low-grade changes consistent with vain 1. I recommended repeat Pap smear 6 months if it is persisting then we will do colposcopy for surveillance at that time. She did have a prior colposcopy with biopsy a year ago that showed vain 1. I again reviewed with the patient the whole issue of low-grade dysplasia versus high-grade dysplasia the issues of progression and regression in the HPV association. Patient is comfortable with the discussion and she will follow up in 6 months for rePap

## 2011-10-23 NOTE — Telephone Encounter (Signed)
Spoke with pt regarding her recent Pap smear showed low-grade atypia as it has in the past. She is to return to have repeat in 6 months, if atypia persistent she come in for colposcopy. Pt states she has C&B done some time ago, pt would like you to call her at your convenience to discuss some questions regarding the above.

## 2011-10-29 ENCOUNTER — Inpatient Hospital Stay (HOSPITAL_COMMUNITY)
Admission: EM | Admit: 2011-10-29 | Discharge: 2011-10-31 | DRG: 918 | Disposition: A | Discharge status: Home / Self Care | Payer: Managed Care, Other (non HMO) | Attending: Family Medicine | Admitting: Family Medicine

## 2011-10-29 ENCOUNTER — Other Ambulatory Visit: Payer: Self-pay

## 2011-10-29 DIAGNOSIS — T438X2A Poisoning by other psychotropic drugs, intentional self-harm, initial encounter: Secondary | ICD-10-CM | POA: Diagnosis present

## 2011-10-29 DIAGNOSIS — K219 Gastro-esophageal reflux disease without esophagitis: Secondary | ICD-10-CM | POA: Diagnosis present

## 2011-10-29 DIAGNOSIS — T424X4A Poisoning by benzodiazepines, undetermined, initial encounter: Principal | ICD-10-CM | POA: Diagnosis present

## 2011-10-29 DIAGNOSIS — K3184 Gastroparesis: Secondary | ICD-10-CM | POA: Diagnosis present

## 2011-10-29 DIAGNOSIS — E039 Hypothyroidism, unspecified: Secondary | ICD-10-CM | POA: Diagnosis present

## 2011-10-29 DIAGNOSIS — T424X1A Poisoning by benzodiazepines, accidental (unintentional), initial encounter: Secondary | ICD-10-CM

## 2011-10-29 DIAGNOSIS — E785 Hyperlipidemia, unspecified: Secondary | ICD-10-CM | POA: Diagnosis present

## 2011-10-29 DIAGNOSIS — J45909 Unspecified asthma, uncomplicated: Secondary | ICD-10-CM | POA: Diagnosis present

## 2011-10-29 DIAGNOSIS — T50901A Poisoning by unspecified drugs, medicaments and biological substances, accidental (unintentional), initial encounter: Secondary | ICD-10-CM | POA: Diagnosis present

## 2011-10-29 DIAGNOSIS — I10 Essential (primary) hypertension: Secondary | ICD-10-CM | POA: Diagnosis present

## 2011-10-29 DIAGNOSIS — F3289 Other specified depressive episodes: Secondary | ICD-10-CM | POA: Diagnosis present

## 2011-10-29 DIAGNOSIS — Z9151 Personal history of suicidal behavior: Secondary | ICD-10-CM | POA: Diagnosis present

## 2011-10-29 DIAGNOSIS — F32A Depression, unspecified: Secondary | ICD-10-CM | POA: Diagnosis present

## 2011-10-29 DIAGNOSIS — Z85850 Personal history of malignant neoplasm of thyroid: Secondary | ICD-10-CM

## 2011-10-29 DIAGNOSIS — R000 Tachycardia, unspecified: Secondary | ICD-10-CM | POA: Diagnosis present

## 2011-10-29 DIAGNOSIS — T43502A Poisoning by unspecified antipsychotics and neuroleptics, intentional self-harm, initial encounter: Secondary | ICD-10-CM | POA: Diagnosis present

## 2011-10-29 DIAGNOSIS — Z915 Personal history of self-harm: Secondary | ICD-10-CM | POA: Diagnosis present

## 2011-10-29 DIAGNOSIS — F329 Major depressive disorder, single episode, unspecified: Secondary | ICD-10-CM | POA: Diagnosis present

## 2011-10-29 DIAGNOSIS — E7800 Pure hypercholesterolemia, unspecified: Secondary | ICD-10-CM | POA: Diagnosis present

## 2011-10-29 HISTORY — DX: Other specified postprocedural states: Z98.890

## 2011-10-29 HISTORY — DX: Unspecified osteoarthritis, unspecified site: M19.90

## 2011-10-29 HISTORY — DX: Unspecified convulsions: R56.9

## 2011-10-29 HISTORY — DX: Headache: R51

## 2011-10-29 HISTORY — DX: Nausea with vomiting, unspecified: R11.2

## 2011-10-29 LAB — COMPREHENSIVE METABOLIC PANEL
ALT: 32 U/L (ref 0–35)
AST: 33 U/L (ref 0–37)
Albumin: 3.7 g/dL (ref 3.5–5.2)
Alkaline Phosphatase: 66 U/L (ref 39–117)
BUN: 8 mg/dL (ref 6–23)
CO2: 29 mEq/L (ref 19–32)
Calcium: 9.7 mg/dL (ref 8.4–10.5)
Chloride: 104 mEq/L (ref 96–112)
Creatinine, Ser: 0.74 mg/dL (ref 0.50–1.10)
GFR calc Af Amer: >90 mL/min (ref >90)
GFR calc non Af Amer: >90 mL/min (ref >90)
Glucose, Bld: 97 mg/dL (ref 70–99)
Potassium: 3.5 mEq/L (ref 3.5–5.1)
Sodium: 140 mEq/L (ref 135–145)
Total Bilirubin: 0.3 mg/dL (ref 0.3–1.2)
Total Protein: 6.6 g/dL (ref 6.0–8.3)

## 2011-10-29 LAB — ACETAMINOPHEN LEVEL: Acetaminophen (Tylenol), Serum: <15 ug/mL (ref 10–30)

## 2011-10-29 LAB — URINALYSIS, ROUTINE W REFLEX MICROSCOPIC
Bilirubin Urine: NEGATIVE
Glucose, UA: NEGATIVE mg/dL
Hgb urine dipstick: NEGATIVE
Ketones, ur: NEGATIVE mg/dL
Leukocytes, UA: NEGATIVE
Nitrite: NEGATIVE
Protein, ur: NEGATIVE mg/dL
Specific Gravity, Urine: 1.01 (ref 1.005–1.030)
Urobilinogen, UA: 0.2 mg/dL (ref 0.0–1.0)
pH: 6 (ref 5.0–8.0)

## 2011-10-29 LAB — CBC
HCT: 36.4 % (ref 36.0–46.0)
Hemoglobin: 12.9 g/dL (ref 12.0–15.0)
MCH: 30.9 pg (ref 26.0–34.0)
MCHC: 35.4 g/dL (ref 30.0–36.0)
MCV: 87.3 fL (ref 78.0–100.0)
Platelets: 230 10*3/uL (ref 150–400)
RBC: 4.17 MIL/uL (ref 3.87–5.11)
RDW: 12.2 % (ref 11.5–15.5)
WBC: 8.3 10*3/uL (ref 4.0–10.5)

## 2011-10-29 LAB — RAPID URINE DRUG SCREEN, HOSP PERFORMED
Amphetamines: NOT DETECTED
Barbiturates: NOT DETECTED
Benzodiazepines: POSITIVE — AB
Cocaine: NOT DETECTED
Opiates: NOT DETECTED
Tetrahydrocannabinol: NOT DETECTED

## 2011-10-29 LAB — SALICYLATE LEVEL: Salicylate Lvl: <2 mg/dL — ABNORMAL LOW (ref 2.8–20.0)

## 2011-10-29 MED ORDER — SODIUM CHLORIDE 0.9 % IV BOLUS (SEPSIS)
1000.0000 mL | Freq: Once | INTRAVENOUS | Status: AC
  Administered 2011-10-29: 1000 mL via INTRAVENOUS

## 2011-10-29 MED ORDER — ONDANSETRON HCL 4 MG/2ML IJ SOLN: 4.0000 mg | Freq: Three times a day (TID) | INTRAMUSCULAR | Status: AC | PRN

## 2011-10-29 MED ORDER — SODIUM CHLORIDE 0.9 % IV SOLN: INTRAVENOUS | Status: DC

## 2011-10-29 NOTE — ED Provider Notes (Signed)
History    49yf with intentional overdose shortly before arrival. Took "a lot of her medicines" and then told husband who called EMS. Pt subsequently became very drowsy and has remained so since. apparently has been under a lot of stress and has ongoing family issues.  CSN: [id number] Arrival date & time: 10/29/2011  7:44 PM   First MD Initiated Contact with Patient 10/29/11 2022      Chief Complaint  Patient presents with  . Drug Overdose    (Consider location/radiation/quality/duration/timing/severity/associated sxs/prior treatment) HPI  Past Medical History  Diagnosis Date  . Asthma   . Hypercholesteremia   . Reflux   . Gastroparesis   . VAIN (vaginal intraepithelial neoplasia)   . GERD (gastroesophageal reflux disease)   . Hypertension   . Osteopenia 05/2009    -1.9  . Hypothyroidism   . Thyroid cancer     papillary    Past Surgical History  Procedure Date  . Cosmetic surgery     buttock liposuction  . Vaginal hysterectomy   . Thyroid surgery     for thyroid cancer  . Nissen fundoplication 2004, 2012    REFLUX  . Hernia repair 2004, 2012  . Breast surgery     reduc tion mammoplasty    Family History  Problem Relation Age of Onset  . Heart disease Mother   . Hypertension Mother   . Ovarian cancer Mother   . Heart disease Father   . Hypertension Father   . Heart disease Sister   . Hypertension Sister   . Diabetes Paternal Grandmother   . Cancer Other     History  Substance Use Topics  . Smoking status: Never Smoker   . Smokeless tobacco: Never Used  . Alcohol Use: No    OB History    Grav Para Term Preterm Abortions TAB SAB Ect Mult Living   1 1        1       Review of Systems  Level 5 caveat because of pt's decreased mental status  Allergies  Bee; Pneumococcal vaccines; Tetanus toxoids; Wellbutrin; Bactrim; Biaxin; Doxycycline; Hydone; Ranitidine; Sulfa antibiotics; and Tetracyclines & related  Home Medications   Current Outpatient  Rx  Name Route Sig Dispense Refill  . ALBUTEROL 90 MCG/ACT IN AERS Inhalation Inhale 3-6 puffs into the lungs as needed. Shortness of breath    . ALPRAZOLAM 1 MG PO TABS Oral Take 1 mg by mouth at bedtime.      . ASPIRIN EC 81 MG PO TBEC Oral Take 81 mg by mouth daily.      Marland Kitchen CETIRIZINE HCL 10 MG PO TABS Oral Take 10 mg by mouth daily.      Marland Kitchen DIPHENHYDRAMINE HCL 12.5 MG PO CHEW Oral Chew 25-100 mg by mouth as needed. Seasonal allergies     . EPINEPHRINE 0.3 MG/0.3ML IJ DEVI Intramuscular Inject 0.3 mLs (0.3 mg total) into the muscle as needed. 2 Device 1  . ESTRADIOL 0.1 MG/24HR TD PTTW Transdermal Place 1 patch (0.1 mg total) onto the skin 2 (two) times a week. 8 patch 11    Pt needs to make appointment.  Marland Kitchen EZETIMIBE 10 MG PO TABS Oral Take 10 mg by mouth daily.     Marland Kitchen LANSOPRAZOLE 30 MG PO TBDP Oral Take 60-120 mg by mouth 2 (two) times daily as needed. Acid reflux     . LEVOTHYROXINE SODIUM 88 MCG PO TABS Oral Take 88 mcg by mouth daily.      Marland Kitchen  LUBIPROSTONE 24 MCG PO CAPS Oral Take 24 mcg by mouth 2 (two) times daily as needed. Fluid    . METOPROLOL SUCCINATE ER 25 MG PO TB24 Oral Take 25 mg by mouth daily.      Marland Kitchen ONDANSETRON 8 MG PO TBDP Oral Take 8 mg by mouth as needed. Nausea and vomiting     . PREDNISONE 10 MG PO TABS  2.5 mg every other day. Taper, 6,5,4,3,2,1     . PREDNISONE 10 MG PO TABS  2.5 mg every other day. Taper, 6,5,4,3,2,1. Patient gets 5 mg but splits it in half to take every other day.     Marland Kitchen RANITIDINE HCL 50 MG/2ML IJ SOLN Intravenous Inject 50 mg into the vein once.      Marland Kitchen ROSUVASTATIN CALCIUM 20 MG PO TABS Oral Take 20 mg by mouth daily.      . TRAVOPROST 0.004 % OP SOLN  1 drop at bedtime.      Marland Kitchen VITAMIN B-12 1000 MCG PO TABS Oral Take 1,000 mcg by mouth daily.        BP 133/97  Pulse 79  Temp(Src) 97.5 F (36.4 C) (Oral)  Resp 16  Wt 136 lb (61.689 kg)  SpO2 100%  LMP 05/23/2006  Physical Exam  Nursing note and vitals reviewed. Constitutional: She appears  well-developed and well-nourished. No distress.  HENT:  Head: Normocephalic and atraumatic.  Mouth/Throat: Oropharynx is clear and moist. No oropharyngeal exudate.  Eyes: Conjunctivae are normal. Pupils are equal, round, and reactive to light. Right eye exhibits no discharge. Left eye exhibits no discharge.  Neck: Normal range of motion. Neck supple.  Cardiovascular: Normal rate, regular rhythm and normal heart sounds.  Exam reveals no gallop and no friction rub.   No murmur heard. Pulmonary/Chest: Effort normal and breath sounds normal. No respiratory distress. She has no wheezes.  Abdominal: Soft. She exhibits no distension. There is no tenderness.  Musculoskeletal: She exhibits no edema and no tenderness.  Neurological: She exhibits normal muscle tone.       GCS 12. E2, M6, V4. Too drowsy to fully assess.  Skin: Skin is warm. She is not diaphoretic.    ED Course  Procedures (including critical care time)  Labs Reviewed  SALICYLATE LEVEL - Abnormal; Notable for the following:    Salicylate Lvl <2.0 (*)    All other components within normal limits  URINE RAPID DRUG SCREEN (HOSP PERFORMED) - Abnormal; Notable for the following:    Benzodiazepines POSITIVE (*)    All other components within normal limits  COMPREHENSIVE METABOLIC PANEL - Abnormal; Notable for the following:    Albumin 3.4 (*)    All other components within normal limits  ACETAMINOPHEN LEVEL  COMPREHENSIVE METABOLIC PANEL  CBC  URINALYSIS, ROUTINE W REFLEX MICROSCOPIC  CBC  CBC  COMPREHENSIVE METABOLIC PANEL   No results found.  EKG:  Rhythm: normal sinus Rate: 80 Axis: normal Intervals:normal ST segments: NS ST changes. Flattening anteriorly.  1. Benzodiazepine overdose     10:01 PM Discussed with PCP, Dr Cleta Alberts, at pt and husband's request. Pt with ongoing depression. Multiple family issues. Apparently husband is pilot and gone frequently and ongoing concerns by pt that having affair which has lead to  marital problems.  MDM  49yF with intentional drug overdose.  Prescribed xanax and suspect drowsiness from this. On metoprolol as well, but HD stable and no significant concerning EKG changes. Tylenol/salicylate levels normal. Pt very drowsy but protecting airway. Will admit for observation.  Raeford Razor, MD 10/31/11 [phone]

## 2011-10-29 NOTE — ED Notes (Signed)
Security notified that patient needs to be searched and wanded. Patients belongings at bedside until wanded

## 2011-10-29 NOTE — ED Notes (Signed)
ZOX:[id number]<AV> Expected date:10/29/11<BR> Expected time: 7:25 PM<BR> Means of arrival:Ambulance<BR> Comments:<BR> EMS 80 GC - intentional OD

## 2011-10-29 NOTE — ED Notes (Signed)
AC made aware of need for sitter 

## 2011-10-29 NOTE — ED Notes (Signed)
Pt. Reports taking a variety of prescription medications amounts unknown in order to harm herself.  Pt. Is alert, oriented but sleepy.

## 2011-10-29 NOTE — ED Notes (Signed)
Patient belongings are locked in exam room 7 linen bottom cabinets

## 2011-10-29 NOTE — ED Notes (Signed)
Michelle [name], Husband, contact information: Cell Phone [phone]

## 2011-10-30 ENCOUNTER — Encounter (HOSPITAL_COMMUNITY): Payer: Self-pay

## 2011-10-30 DIAGNOSIS — Z915 Personal history of self-harm: Secondary | ICD-10-CM | POA: Diagnosis present

## 2011-10-30 DIAGNOSIS — T50901A Poisoning by unspecified drugs, medicaments and biological substances, accidental (unintentional), initial encounter: Secondary | ICD-10-CM

## 2011-10-30 DIAGNOSIS — Z85850 Personal history of malignant neoplasm of thyroid: Secondary | ICD-10-CM

## 2011-10-30 DIAGNOSIS — F329 Major depressive disorder, single episode, unspecified: Secondary | ICD-10-CM | POA: Diagnosis present

## 2011-10-30 HISTORY — DX: Poisoning by unspecified drugs, medicaments and biological substances, accidental (unintentional), initial encounter: T50.901A

## 2011-10-30 LAB — COMPREHENSIVE METABOLIC PANEL
AST: 31 U/L (ref 0–37)
CO2: 28 mEq/L (ref 19–32)
Calcium: 8.9 mg/dL (ref 8.4–10.5)
Creatinine, Ser: 0.76 mg/dL (ref 0.50–1.10)
GFR calc Af Amer: >90 mL/min (ref >90)
GFR calc non Af Amer: >90 mL/min (ref >90)
Glucose, Bld: 86 mg/dL (ref 70–99)

## 2011-10-30 LAB — CBC
Hemoglobin: 12.4 g/dL (ref 12.0–15.0)
RBC: 4.08 MIL/uL (ref 3.87–5.11)

## 2011-10-30 MED ORDER — SODIUM CHLORIDE 0.9 % IV SOLN
INTRAVENOUS | Status: DC
  Administered 2011-10-30: 11:00:00 via INTRAVENOUS

## 2011-10-30 MED ORDER — POTASSIUM CHLORIDE IN NACL 20-0.9 MEQ/L-% IV SOLN
INTRAVENOUS | Status: DC
  Administered 2011-10-30: 02:00:00 via INTRAVENOUS
  Filled 2011-10-30 (×3): qty 1000

## 2011-10-30 MED ORDER — ENOXAPARIN SODIUM 40 MG/0.4ML ~~LOC~~ SOLN
40.0000 mg | Freq: Every day | SUBCUTANEOUS | Status: DC
  Administered 2011-10-30 – 2011-10-31 (×2): 40 mg via SUBCUTANEOUS
  Filled 2011-10-30 (×7): qty 0.4
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
## 2011-10-30 NOTE — ED Notes (Signed)
-    Dr. Brien Few (hospitalist) was made aware of bed situation and if possible transfer pt to [facility].       Dr. Brien Few ordered to keep pt in the TCU since she was going to be off telemetry in the a.m. (10/31/2011),  As well as not to incur any additional cost to the family.   E/D charge RN aware.

## 2011-10-30 NOTE — ED Notes (Signed)
-   seen by chaplain (Alexis) 

## 2011-10-30 NOTE — ED Notes (Signed)
MD at bedside. 

## 2011-10-30 NOTE — ED Notes (Signed)
Family at bedside. 

## 2011-10-30 NOTE — Progress Notes (Signed)
Subjective: Somnolent, but easily rousable. Not in acute discomfort, not short of breath, following simple commands.  Objective: Vital signs in last 24 hours: Temp:  [97.3 F (36.3 C)-98.3 F (36.8 C)] 98.3 F (36.8 C) (12/07 0932) Pulse Rate:  [69-85] 85  (12/07 0932) Resp:  [13-20] 20  (12/07 0932) BP: (101-158)/(50-102) 115/70 mmHg (12/07 0932) SpO2:  [100 %] 100 % (12/07 0932) Weight:  [61.689 kg (136 lb)] 136 lb (61.689 kg) (12/06 1935) Weight change:     Intake/Output from previous day: 12/06 0701 - 12/07 0700 In: 300 [I.V.:300] Out: -      Physical Exam: General: Somnolent, easily rousable, oriented when roused. fully oriented, not short of breath at rest.  HEENT:  No clinical pallor, no jaundice, no conjunctival injection or discharge. Hydration status is satisfactory. NECK:  Supple, JVP not seen, no carotid bruits, no palpable lymphadenopathy, no palpable goiter. CHEST:  Clinically clear to auscultation, no wheezes, no crackles. HEART:  Sounds 1 and 2 heard, normal, regular, no murmurs. ABDOMEN:  Full, soft, no scars, non-tender, no palpable organomegaly, no palpable masses, normal bowel sounds. GENITALIA:  Not examined. LOWER EXTREMITIES:  No pitting edema, palpable peripheral pulses. MUSCULOSKELETAL SYSTEM:  Generalized osteoarthritic changes, otherwise, normal. CENTRAL NERVOUS SYSTEM:  No focal neurologic deficit on gross examination.  Lab Results:  [facility] 10/30/11 0521 10/29/11 2045  WBC 8.3 8.3  HGB 12.4 12.9  HCT 36.5 36.4  PLT 215 230    Basename 10/30/11 0521 10/29/11 2045  NA 140 140  K 4.3 3.5  CL 107 104  CO2 28 29  GLUCOSE 86 97  BUN 7 8  CREATININE 0.76 0.74  CALCIUM 8.9 9.7   No results found for this or any previous visit (from the past 240 hour(s)).   Studies/Results: No results found.  Medications: Scheduled Meds:   . sodium chloride   Intravenous STAT  . enoxaparin  40 mg Subcutaneous QHS  . sodium chloride  1,000 mL  Intravenous Once   Continuous Infusions:   . sodium chloride 75 mL/hr at 10/30/11 1121  . DISCONTD: 0.9 % NaCl with KCl 20 mEq / L 75 mL/hr at 10/30/11 0142   PRN Meds:.ondansetron (ZOFRAN) IV  Assessment/Plan:  Principal Problem:  *Overdose: Multiple drug overdose. UDS is positive for benzodiazepines. Will manage with observation, 1:1 sitter, and supportive measures. No evidence of respiratory depression. Active Problems:  1. GERD (gastroesophageal reflux disease): Asymptomatic at this time.  2. Hypothyroidism: Will continue replacement treatment and check TSH.  3. Hypertension: BP was borderline at presentation. Now normal. Antihypertensives will be on hold. ivi fluids continue.  4. Gastroparesis: Not problematic.  5. Hypercholesteremia: Will continue statin, in due course.   History of thyroid cancer  Comment: Still somnolent at this time. Will consult Psychiatrist, when fully alert, probably on 10/31/11.  LOS: 1 day   Michelle [name],CHRISTOPHER 10/30/2011, 12:08 PM
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
## 2011-10-30 NOTE — ED Notes (Signed)
Called and spoke with pt's husband and he stated he would come and pick up pt's medications. Oncoming rn informed.

## 2011-10-30 NOTE — ED Notes (Signed)
Patient is resting comfortably. 

## 2011-10-30 NOTE — ED Notes (Signed)
Vital signs stable. 
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
## 2011-10-31 LAB — CBC
HCT: 35.2 % — ABNORMAL LOW (ref 36.0–46.0)
Hemoglobin: 12.1 g/dL (ref 12.0–15.0)
MCV: 88 fL (ref 78.0–100.0)
RBC: 4 MIL/uL (ref 3.87–5.11)
WBC: 7.7 10*3/uL (ref 4.0–10.5)

## 2011-10-31 LAB — COMPREHENSIVE METABOLIC PANEL
BUN: 7 mg/dL (ref 6–23)
CO2: 28 mEq/L (ref 19–32)
Chloride: 105 mEq/L (ref 96–112)
Creatinine, Ser: 0.65 mg/dL (ref 0.50–1.10)
GFR calc non Af Amer: >90 mL/min (ref >90)
Total Bilirubin: 0.5 mg/dL (ref 0.3–1.2)

## 2011-10-31 MED ORDER — ASPIRIN EC 81 MG PO TBEC
81.0000 mg | DELAYED_RELEASE_TABLET | Freq: Every day | ORAL | Status: DC
  Administered 2011-10-31: 81 mg via ORAL
  Filled 2011-10-31 (×2): qty 1

## 2011-10-31 MED ORDER — LEVOTHYROXINE SODIUM 88 MCG PO TABS
88.0000 ug | ORAL_TABLET | Freq: Every day | ORAL | Status: DC
  Administered 2011-10-31: 88 ug via ORAL
  Filled 2011-10-31 (×2): qty 1

## 2011-10-31 MED ORDER — ALBUTEROL SULFATE HFA 108 (90 BASE) MCG/ACT IN AERS
3.0000 | INHALATION_SPRAY | Freq: Four times a day (QID) | RESPIRATORY_TRACT | Status: DC | PRN
  Filled 2011-10-31: qty 6.7

## 2011-10-31 MED ORDER — ALBUTEROL 90 MCG/ACT IN AERS: 3.0000 | INHALATION_SPRAY | Freq: Four times a day (QID) | RESPIRATORY_TRACT | Status: DC | PRN

## 2011-10-31 MED ORDER — SODIUM CHLORIDE 0.9 % IJ SOLN: 10.0000 mL | Freq: Two times a day (BID) | INTRAMUSCULAR | Status: DC

## 2011-10-31 NOTE — Progress Notes (Signed)
Gave pt discharge instructions and went over them with her. Pt verbalized understanding of instructions given. Pt walked out accompanied by tech in no acute distress.

## 2011-10-31 NOTE — Progress Notes (Signed)
Subjective: Asymptomatic, alert, fully oriented, ambulating, good oral intake.  Objective: Vital signs in last 24 hours: Temp:  [97.8 F (36.6 C)-98.4 F (36.9 C)] 98.1 F (36.7 C) (12/08 0610) Pulse Rate:  [79-87] 79  (12/08 0610) Resp:  [16-18] 18  (12/08 0610) BP: (94-118)/(65-79) 113/72 mmHg (12/08 0610) SpO2:  [96 %-100 %] 96 % (12/08 0610) Weight:  [65.6 kg (144 lb 10 oz)] 144 lb 10 oz (65.6 kg) (12/07 2030) Weight change: 3.911 kg (8 lb 10 oz)    Intake/Output from previous day: 12/07 0701 - 12/08 0700 In: -  Out: 100 [Urine:100]     Physical Exam: General: Alert, communicative, not short of breath at rest.  HEENT:  No clinical pallor, no jaundice, no conjunctival injection or discharge. Hydration status is satisfactory. NECK:  Supple, JVP not seen, no carotid bruits, no palpable lymphadenopathy, no palpable goiter. CHEST:  Clinically clear to auscultation, no wheezes, no crackles. HEART:  Sounds 1 and 2 heard, normal, regular, no murmurs. ABDOMEN:  Full, soft, non-tender, no palpable organomegaly, no palpable masses, normal bowel sounds. GENITALIA:  Not examined. LOWER EXTREMITIES:  No pitting edema, palpable peripheral pulses. MUSCULOSKELETAL SYSTEM:  Generalized osteoarthritic changes, otherwise, normal. CENTRAL NERVOUS SYSTEM:  No focal neurologic deficit on gross examination.  Lab Results:  Basename 10/31/11 0520 10/30/11 0521  WBC 7.7 8.3  HGB 12.1 12.4  HCT 35.2* 36.5  PLT 203 215    Basename 10/31/11 0520 10/30/11 0521  NA 140 140  K 3.4* 4.3  CL 105 107  CO2 28 28  GLUCOSE 79 86  BUN 7 7  CREATININE 0.65 0.76  CALCIUM 8.7 8.9   No results found for this or any previous visit (from the past 240 hour(s)).   Studies/Results: No results found.  Medications: Scheduled Meds:    . aspirin EC  81 mg Oral Daily  . enoxaparin  40 mg Subcutaneous QHS  . levothyroxine  88 mcg Oral QAC breakfast  . DISCONTD: sodium chloride   Intravenous STAT    Continuous Infusions:    . sodium chloride 75 mL/hr at 10/30/11 1121   PRN Meds:.albuterol, ondansetron (ZOFRAN) IV, DISCONTD: albuterol  Assessment/Plan:  Principal Problem:  *Overdose: Multiple drug overdose. UDS is positive for benzodiazepines. Appears to have suffered no deleterious effects. Continue observation, 1:1 sitter, till seen by psych. Active Problems:  1. GERD (gastroesophageal reflux disease): Asymptomatic at this time.  2. Hypothyroidism: On replacement treatment.  3. Hypertension: Normotensive at this time.  4. Gastroparesis: Not problematic.  5. Hypercholesteremia: To continue statin.   History of thyroid cancer  Comment: Stable and cleared medically. Await psychiatry consult for disposition.  DC telemetry.  DC ivi fluids. Continue Sitter.  LOS: 2 days   Devynn Scheff,CHRISTOPHER 10/31/2011, 11:16 AM

## 2011-10-31 NOTE — Consult Note (Signed)
Reason for Consult: SI with OD Referring Physician: [name] is an 50 y.o. female. This a 50 year old female who was admitted overdose. History obtained mainly from her husband and pt today. Per history she has problems with a mean family. she  took a combination of her pills, she told her husband after she started feeling loopy, he called 911 she was brought to the ER. Her home medication combinations includes diazepam, Neurontin, metoprolol,certrizione, but as stated we are unclear what combination of medications were taken.  Per pt today she is feeling better. No suicudal thoughts now and wants to go home. Not ineterested to go to inpatient psy floor. Husband also agreed with her. She is going to cut contact with that 'problem family members' as they always accuse her of stealing things from their homes. No SI attempts in past.  Review of Systems:  Unable assess due to patient's mentation   Past Medical History  Diagnosis Date  . Hypercholesteremia   . Reflux   . Gastroparesis   . VAIN (vaginal intraepithelial neoplasia)   . GERD (gastroesophageal reflux disease)   . Hypertension   . Osteopenia 05/2009    -1.9  . Hypothyroidism   . Thyroid cancer     papillary  . PONV (postoperative nausea and vomiting)   . Osteoarthritis   . Asthma   . Gastroparesis     history of  . Headache     migraines  . Seizures     1 seizure secondary to wellbutrin-none since    Past Surgical History  Procedure Date  . Cosmetic surgery     buttock liposuction  . Thyroid surgery     for thyroid cancer  . Nissen fundoplication 2004, 2012    REFLUX  . Hernia repair 2004, 2012  . Breast surgery     reduc tion mammoplasty  . Cholecystectomy   . Vaginal hysterectomy     vaginal  . Upper gastrointestinal endoscopy 08/26/2004  . Varicose vein surgery 03/20/2003    right leg    Family History  Problem Relation Age of Onset  . Heart disease Mother   . Hypertension Mother   . Ovarian  cancer Mother   . Heart disease Father   . Hypertension Father   . Heart disease Sister   . Hypertension Sister   . Diabetes Paternal Grandmother   . Cancer Other     Social History:  reports that she has never smoked. She has never used smokeless tobacco. She reports that she does not drink alcohol or use illicit drugs.  Allergies:  Allergies  Allergen Reactions  . Bee Anaphylaxis  . Pneumococcal Vaccines Anaphylaxis  . Tetanus Toxoids Swelling  . Wellbutrin (Bupropion Hcl) Other (See Comments)    seizure  . Bactrim Hives and Other (See Comments)    Severe headache  . Biaxin Itching and Other (See Comments)    Severe headache  . Doxycycline Other (See Comments)    Severe GI upset  . Hydone (Chlorthalidone) Other (See Comments)    Vasculitis   . Ranitidine Other (See Comments)    Acts like a diuretic   . Sulfa Antibiotics Hives and Other (See Comments)    Severe headache  . Tetracyclines & Related Other (See Comments)    Severe GI upset    Medications: I have reviewed the patient's current medications.  Results for orders placed during the hospital encounter of 10/29/11 (from the past 48 hour(s))  ACETAMINOPHEN LEVEL  Status: Normal   Collection Time   10/29/11  8:40 PM      Component Value Range Comment   Acetaminophen (Tylenol), Serum <15.0  10 - 30 (ug/mL)   SALICYLATE LEVEL     Status: Abnormal   Collection Time   10/29/11  8:40 PM      Component Value Range Comment   Salicylate Lvl <2.0 (*) 2.8 - 20.0 (mg/dL)   COMPREHENSIVE METABOLIC PANEL     Status: Normal   Collection Time   10/29/11  8:45 PM      Component Value Range Comment   Sodium 140  135 - 145 (mEq/L)    Potassium 3.5  3.5 - 5.1 (mEq/L)    Chloride 104  96 - 112 (mEq/L)    CO2 29  19 - 32 (mEq/L)    Glucose, Bld 97  70 - 99 (mg/dL)    BUN 8  6 - 23 (mg/dL)    Creatinine, Ser 2.95  0.50 - 1.10 (mg/dL)    Calcium 9.7  8.4 - 10.5 (mg/dL)    Total Protein 6.6  6.0 - 8.3 (g/dL)    Albumin 3.7   3.5 - 5.2 (g/dL)    AST 33  0 - 37 (U/L)    ALT 32  0 - 35 (U/L)    Alkaline Phosphatase 66  39 - 117 (U/L)    Total Bilirubin 0.3  0.3 - 1.2 (mg/dL)    GFR calc non Af Amer >90  >90 (mL/min)    GFR calc Af Amer >90  >90 (mL/min)   CBC     Status: Normal   Collection Time   10/29/11  8:45 PM      Component Value Range Comment   WBC 8.3  4.0 - 10.5 (K/uL)    RBC 4.17  3.87 - 5.11 (MIL/uL)    Hemoglobin 12.9  12.0 - 15.0 (g/dL)    HCT 62.1  30.8 - 65.7 (%)    MCV 87.3  78.0 - 100.0 (fL)    MCH 30.9  26.0 - 34.0 (pg)    MCHC 35.4  30.0 - 36.0 (g/dL)    RDW 84.6  96.2 - 95.2 (%)    Platelets 230  150 - 400 (K/uL)   URINALYSIS, ROUTINE W REFLEX MICROSCOPIC     Status: Normal   Collection Time   10/29/11  8:52 PM      Component Value Range Comment   Color, Urine YELLOW  YELLOW     APPearance CLEAR  CLEAR     Specific Gravity, Urine 1.010  1.005 - 1.030     pH 6.0  5.0 - 8.0     Glucose, UA NEGATIVE  NEGATIVE (mg/dL)    Hgb urine dipstick NEGATIVE  NEGATIVE     Bilirubin Urine NEGATIVE  NEGATIVE     Ketones, ur NEGATIVE  NEGATIVE (mg/dL)    Protein, ur NEGATIVE  NEGATIVE (mg/dL)    Urobilinogen, UA 0.2  0.0 - 1.0 (mg/dL)    Nitrite NEGATIVE  NEGATIVE     Leukocytes, UA NEGATIVE  NEGATIVE  MICROSCOPIC NOT DONE ON URINES WITH NEGATIVE PROTEIN, BLOOD, LEUKOCYTES, NITRITE, OR GLUCOSE <1000 mg/dL.  URINE RAPID DRUG SCREEN (HOSP PERFORMED)     Status: Abnormal   Collection Time   10/29/11  8:59 PM      Component Value Range Comment   Opiates NONE DETECTED  NONE DETECTED     Cocaine NONE DETECTED  NONE DETECTED     Benzodiazepines  POSITIVE (*) NONE DETECTED     Amphetamines NONE DETECTED  NONE DETECTED     Tetrahydrocannabinol NONE DETECTED  NONE DETECTED     Barbiturates NONE DETECTED  NONE DETECTED    COMPREHENSIVE METABOLIC PANEL     Status: Abnormal   Collection Time   10/30/11  5:21 AM      Component Value Range Comment   Sodium 140  135 - 145 (mEq/L)    Potassium 4.3  3.5 - 5.1  (mEq/L)    Chloride 107  96 - 112 (mEq/L)    CO2 28  19 - 32 (mEq/L)    Glucose, Bld 86  70 - 99 (mg/dL)    BUN 7  6 - 23 (mg/dL)    Creatinine, Ser 8.11  0.50 - 1.10 (mg/dL)    Calcium 8.9  8.4 - 10.5 (mg/dL)    Total Protein 6.0  6.0 - 8.3 (g/dL)    Albumin 3.4 (*) 3.5 - 5.2 (g/dL)    AST 31  0 - 37 (U/L)    ALT 30  0 - 35 (U/L)    Alkaline Phosphatase 59  39 - 117 (U/L)    Total Bilirubin 0.4  0.3 - 1.2 (mg/dL)    GFR calc non Af Amer >90  >90 (mL/min)    GFR calc Af Amer >90  >90 (mL/min)   CBC     Status: Normal   Collection Time   10/30/11  5:21 AM      Component Value Range Comment   WBC 8.3  4.0 - 10.5 (K/uL)    RBC 4.08  3.87 - 5.11 (MIL/uL)    Hemoglobin 12.4  12.0 - 15.0 (g/dL)    HCT 91.4  78.2 - 95.6 (%)    MCV 89.5  78.0 - 100.0 (fL)    MCH 30.4  26.0 - 34.0 (pg)    MCHC 34.0  30.0 - 36.0 (g/dL)    RDW 21.3  08.6 - 57.8 (%)    Platelets 215  150 - 400 (K/uL)   CBC     Status: Abnormal   Collection Time   10/31/11  5:20 AM      Component Value Range Comment   WBC 7.7  4.0 - 10.5 (K/uL)    RBC 4.00  3.87 - 5.11 (MIL/uL)    Hemoglobin 12.1  12.0 - 15.0 (g/dL)    HCT 46.9 (*) 62.9 - 46.0 (%)    MCV 88.0  78.0 - 100.0 (fL)    MCH 30.3  26.0 - 34.0 (pg)    MCHC 34.4  30.0 - 36.0 (g/dL)    RDW 52.8  41.3 - 24.4 (%)    Platelets 203  150 - 400 (K/uL)   COMPREHENSIVE METABOLIC PANEL     Status: Abnormal   Collection Time   10/31/11  5:20 AM      Component Value Range Comment   Sodium 140  135 - 145 (mEq/L)    Potassium 3.4 (*) 3.5 - 5.1 (mEq/L)    Chloride 105  96 - 112 (mEq/L)    CO2 28  19 - 32 (mEq/L)    Glucose, Bld 79  70 - 99 (mg/dL)    BUN 7  6 - 23 (mg/dL)    Creatinine, Ser 0.10  0.50 - 1.10 (mg/dL)    Calcium 8.7  8.4 - 10.5 (mg/dL)    Total Protein 5.6 (*) 6.0 - 8.3 (g/dL)    Albumin 3.0 (*) 3.5 - 5.2 (g/dL)  AST 23  0 - 37 (U/L)    ALT 25  0 - 35 (U/L)    Alkaline Phosphatase 56  39 - 117 (U/L)    Total Bilirubin 0.5  0.3 - 1.2 (mg/dL)    GFR  calc non Af Amer >90  >90 (mL/min)    GFR calc Af Amer >90  >90 (mL/min)     No results found.  ROS Blood pressure 129/90, pulse 90, temperature 97.7 F (36.5 C), temperature source Oral, resp. rate 18, height 5\' 4"  (1.626 m), weight 65.6 kg (144 lb 10 oz), last menstrual period 05/23/2006, SpO2 98.00%. Physical Exam: awake alert.  MSE: good eye contact, mood ok, affect broad, no si, no hi, no AVH, fair insight and judgement in to her probelms  Assessment/Plan:  AXIS I: Adjustment D/O with dep mood II Def II> s/p OD IV; family conflict V: 65/100  1. Recommended to go to inpatient floor but pt not interested. No ground to hold pt against her will as she denies si thought now. husband agreed with her. 2. Recommended to see out psy and therapist to help with chronic family conflict  Wonda Cerise 10/31/2011, 3:44 PM

## 2011-10-31 NOTE — Discharge Summary (Signed)
Physician Discharge Summary  Patient ID: Michelle [name] MRN: [id number] DOB/AGE: [birth year] 50 y.o.  Admit date: 10/29/2011 Discharge date: 10/31/2011  Primary Care Physician:  [name], MD   Discharge Diagnoses:    Patient Active Problem List  Diagnoses  . GERD (gastroesophageal reflux disease)  . Thyroid cancer  . Hypothyroidism  . Hypertension  . Asthma  . Gastroparesis  . Hypercholesteremia  . VAIN (vaginal intraepithelial neoplasia)  . History of thyroid cancer  . Overdose drug  . Depression  . Suicide attempt    Current Discharge Medication List    CONTINUE these medications which have NOT CHANGED   Details  albuterol (PROVENTIL,VENTOLIN) 90 MCG/ACT inhaler Inhale 3-6 puffs into the lungs as needed. Shortness of breath    ALPRAZolam (XANAX) 1 MG tablet Take 1 mg by mouth at bedtime.      aspirin EC 81 MG tablet Take 81 mg by mouth daily.      cetirizine (ZYRTEC) 10 MG tablet Take 10 mg by mouth daily.      diphenhydrAMINE (BENADRYL ALLERGY CHILDRENS) 12.5 MG chewable tablet Chew 25-100 mg by mouth as needed. Seasonal allergies     EPINEPHrine (EPIPEN) 0.3 mg/0.3 mL DEVI Inject 0.3 mLs (0.3 mg total) into the muscle as needed. Qty: 2 Device, Refills: 1    estradiol (VIVELLE-DOT) 0.1 MG/24HR Place 1 patch (0.1 mg total) onto the skin 2 (two) times a week. Qty: 8 patch, Refills: 11    ezetimibe (ZETIA) 10 MG tablet Take 10 mg by mouth daily.     lansoprazole (PREVACID SOLUTAB) 30 MG disintegrating tablet Take 60-120 mg by mouth 2 (two) times daily as needed. Acid reflux     levothyroxine (SYNTHROID, LEVOTHROID) 88 MCG tablet Take 88 mcg by mouth daily.      lubiprostone (AMITIZA) 24 MCG capsule Take 24 mcg by mouth 2 (two) times daily as needed. Fluid    metoprolol succinate (TOPROL-XL) 25 MG 24 hr tablet Take 25 mg by mouth daily.      ondansetron (ZOFRAN-ODT) 8 MG disintegrating tablet Take 8 mg by mouth as needed. Nausea and vomiting     !!  predniSONE (DELTASONE) 10 MG tablet 2.5 mg every other day. Taper, 6,5,4,3,2,1     !! predniSONE (DELTASONE) 10 MG tablet 2.5 mg every other day. Taper, 6,5,4,3,2,1. Patient gets 5 mg but splits it in half to take every other day.     ranitidine (ZANTAC) 50 MG/2ML SOLN Inject 50 mg into the vein once.      rosuvastatin (CRESTOR) 20 MG tablet Take 20 mg by mouth daily.      travoprost, benzalkonium, (TRAVATAN) 0.004 % ophthalmic solution 1 drop at bedtime.      vitamin B-12 (CYANOCOBALAMIN) 1000 MCG tablet Take 1,000 mcg by mouth daily.       !! - Potential duplicate medications found. Please discuss with provider.       Disposition and Follow-up:  Follow up with outpatient psychiatry and therapist, for family conflict, and routinely, with primary MD. . Consults:  psychiatry  Wonda Cerise, MD  Significant Diagnostic Studies:  No results found.  Brief H and P: For complete details, refer to admission H and P. However, in brief, this a 50 year old female who was admitted, with multiple medication overdose, due to depression over marital issues. Her home medications  include diazepam, Neurontin, metoprolol,certrizione. It was unclear which, and how many she took, but UDS was positive only for benzodiazepines. She was admitted for further evaluation, investigation and  management.  On 10/31/11.  Physical Exam:  General: Alert, communicative, not short of breath at rest.  HEENT: No clinical pallor, no jaundice, no conjunctival injection or discharge. Hydration status is satisfactory.  NECK: Supple, JVP not seen, no carotid bruits, no palpable lymphadenopathy, no palpable goiter.  CHEST: Clinically clear to auscultation, no wheezes, no crackles.  HEART: Sounds 1 and 2 heard, normal, regular, no murmurs.  ABDOMEN: Full, soft, non-tender, no palpable organomegaly, no palpable masses, normal bowel sounds.  GENITALIA: Not examined.  LOWER EXTREMITIES: No pitting edema, palpable peripheral  pulses.  MUSCULOSKELETAL SYSTEM: Generalized osteoarthritic changes, otherwise, normal.  CENTRAL NERVOUS SYSTEM: No focal neurologic deficit on gross examination.  Hospital Course:  Principal Problem:  *Overdose: Patient presented as described above, and was hypersomnolent on 10/30/2011, but showed no evidence of respiratory depression. She was managed with supportive treatment, iv fluids, telemetric monitoring and close observation. No arrhythmias were recorded, and by 10/31/11, she was fully alert, ambulant, oriented and keen to go home. She was then cleared from medical standpoint. Dr Theotis Barrio, Psychiatrist saw patient later the same day, offered her inpatient psychiatric treatment, which she declined. He has opined that there was no bases for involuntary commitment as she no longer had suicidal thoughts, and recommends discharge with outpatient psychiatric follow up. Active Problems:  1. GERD (gastroesophageal reflux disease): This was asymptomatic..  2. Hypothyroidism: Patent was continued on replacement treatment.  3. Hypertension: Patient remained normotensive.  4. Gastroparesis: This was not problematic.  5. Hypercholesteremia: Patient will continue pre-admission statin.   Comment: Patient was discharged on 10/31/11. Time spent on Discharge: 35 mins.  Signed: Oiva Dibari,CHRISTOPHER 10/31/2011, 5:08 PM

## 2011-11-18 ENCOUNTER — Ambulatory Visit (INDEPENDENT_AMBULATORY_CARE_PROVIDER_SITE_OTHER): Payer: Managed Care, Other (non HMO)

## 2011-11-18 DIAGNOSIS — C73 Malignant neoplasm of thyroid gland: Secondary | ICD-10-CM

## 2011-11-18 DIAGNOSIS — R5382 Chronic fatigue, unspecified: Secondary | ICD-10-CM

## 2011-11-18 DIAGNOSIS — E0590 Thyrotoxicosis, unspecified without thyrotoxic crisis or storm: Secondary | ICD-10-CM

## 2011-11-27 ENCOUNTER — Encounter (INDEPENDENT_AMBULATORY_CARE_PROVIDER_SITE_OTHER): Payer: Managed Care, Other (non HMO) | Admitting: General Surgery

## 2011-12-24 ENCOUNTER — Telehealth: Payer: Self-pay | Admitting: Physician Assistant

## 2011-12-24 MED ORDER — ALPRAZOLAM 1 MG PO TABS: ORAL_TABLET | ORAL | Status: DC

## 2011-12-24 NOTE — Telephone Encounter (Signed)
Rx called in to pharmacy. 

## 2011-12-24 NOTE — Telephone Encounter (Signed)
Please call in. csj

## 2012-01-02 ENCOUNTER — Other Ambulatory Visit: Payer: Self-pay

## 2012-01-02 ENCOUNTER — Emergency Department (HOSPITAL_BASED_OUTPATIENT_CLINIC_OR_DEPARTMENT_OTHER)
Admission: EM | Admit: 2012-01-02 | Discharge: 2012-01-02 | Disposition: A | Discharge status: Home / Self Care | Payer: BC Managed Care – PPO | Attending: Emergency Medicine | Admitting: Emergency Medicine

## 2012-01-02 ENCOUNTER — Emergency Department (INDEPENDENT_AMBULATORY_CARE_PROVIDER_SITE_OTHER): Payer: BC Managed Care – PPO

## 2012-01-02 ENCOUNTER — Encounter (HOSPITAL_BASED_OUTPATIENT_CLINIC_OR_DEPARTMENT_OTHER): Payer: Self-pay | Admitting: Emergency Medicine

## 2012-01-02 DIAGNOSIS — I10 Essential (primary) hypertension: Secondary | ICD-10-CM | POA: Insufficient documentation

## 2012-01-02 DIAGNOSIS — R0602 Shortness of breath: Secondary | ICD-10-CM | POA: Insufficient documentation

## 2012-01-02 DIAGNOSIS — K219 Gastro-esophageal reflux disease without esophagitis: Secondary | ICD-10-CM | POA: Insufficient documentation

## 2012-01-02 DIAGNOSIS — E7800 Pure hypercholesterolemia, unspecified: Secondary | ICD-10-CM | POA: Insufficient documentation

## 2012-01-02 DIAGNOSIS — T7840XA Allergy, unspecified, initial encounter: Secondary | ICD-10-CM

## 2012-01-02 DIAGNOSIS — X58XXXA Exposure to other specified factors, initial encounter: Secondary | ICD-10-CM

## 2012-01-02 DIAGNOSIS — Z9109 Other allergy status, other than to drugs and biological substances: Secondary | ICD-10-CM | POA: Insufficient documentation

## 2012-01-02 DIAGNOSIS — Z79899 Other long term (current) drug therapy: Secondary | ICD-10-CM | POA: Insufficient documentation

## 2012-01-02 MED ORDER — DIPHENHYDRAMINE HCL 50 MG/ML IJ SOLN
INTRAMUSCULAR | Status: AC
  Administered 2012-01-02: 25 mg via INTRAVENOUS
  Filled 2012-01-02: qty 1

## 2012-01-02 MED ORDER — FAMOTIDINE IN NACL 20-0.9 MG/50ML-% IV SOLN
20.0000 mg | Freq: Once | INTRAVENOUS | Status: AC
  Administered 2012-01-02: 20 mg via INTRAVENOUS
  Filled 2012-01-02: qty 50

## 2012-01-02 MED ORDER — METHYLPREDNISOLONE SODIUM SUCC 125 MG IJ SOLR
125.0000 mg | Freq: Once | INTRAMUSCULAR | Status: AC
  Administered 2012-01-02: 125 mg via INTRAVENOUS
  Filled 2012-01-02: qty 2

## 2012-01-02 MED ORDER — ALBUTEROL SULFATE (5 MG/ML) 0.5% IN NEBU
INHALATION_SOLUTION | RESPIRATORY_TRACT | Status: AC
  Administered 2012-01-02: 16:00:00
  Filled 2012-01-02: qty 1

## 2012-01-02 MED ORDER — EPINEPHRINE 0.3 MG/0.3ML IJ DEVI: 0.3000 mg | INTRAMUSCULAR | Status: DC | PRN

## 2012-01-02 MED ORDER — SODIUM CHLORIDE 0.9 % IV BOLUS (SEPSIS)
1000.0000 mL | Freq: Once | INTRAVENOUS | Status: AC
  Administered 2012-01-02: 1000 mL via INTRAVENOUS

## 2012-01-02 MED ORDER — PREDNISONE 50 MG PO TABS: ORAL_TABLET | ORAL | Status: AC

## 2012-01-02 MED ORDER — DIPHENHYDRAMINE HCL 25 MG PO TABS: 25.0000 mg | ORAL_TABLET | Freq: Four times a day (QID) | ORAL | Status: DC

## 2012-01-02 MED ORDER — DIPHENHYDRAMINE HCL 50 MG/ML IJ SOLN
25.0000 mg | Freq: Once | INTRAMUSCULAR | Status: AC
  Administered 2012-01-02: 25 mg via INTRAVENOUS
  Filled 2012-01-02: qty 1

## 2012-01-02 NOTE — ED Notes (Signed)
rx x 3 e-prescribed for prednisone, benadryl and epipen- pt has ride coming to pick her up

## 2012-01-02 NOTE — ED Provider Notes (Signed)
After treatment in the ED the patient feels back to baseline and wants to go home.   Michelle Shi, MD 01/02/12 [phone]

## 2012-01-02 NOTE — ED Notes (Signed)
Patient arrived to the ED on a nebulizer treatment. She was receiving 5.0 mg of Albuterol via EMS. BBS-clear and equal and no stridor was noted. Respiratory rate is 21 with an Spo2-100% on room air and patient is currently stable. Md is currently at bedside assessing patient. Rt will continue to monitor.

## 2012-01-02 NOTE — ED Provider Notes (Signed)
History     CSN: [id number]  Arrival date & time 01/02/12  1439   First MD Initiated Contact with Patient 01/02/12 1444      Chief Complaint  Patient presents with  . Allergic Reaction  . Shortness of Breath    (Consider location/radiation/quality/duration/timing/severity/associated sxs/prior treatment) HPI Comments: Patient arrives with allergic reaction to perfume. She has a history of many severe allergies and is essentially not left her house in 4 months. She develop shortness of breath, throat closing, chest tightness, wheezing after being exposed to perfume. She used her own EpiPen. EMS gave her albuterol, Zantac and Benadryl. She feels like she is breathing somewhat better. She denies any fevers, abdominal pain, nausea vomiting or diarrhea.  The history is provided by the patient and the EMS personnel.    Past Medical History  Diagnosis Date  . Hypercholesteremia   . Reflux   . Gastroparesis   . VAIN (vaginal intraepithelial neoplasia)   . GERD (gastroesophageal reflux disease)   . Hypertension   . Osteopenia 05/2009    -1.9  . Hypothyroidism   . Thyroid cancer     papillary  . PONV (postoperative nausea and vomiting)   . Osteoarthritis   . Asthma   . Gastroparesis     history of  . Headache     migraines  . Seizures     1 seizure secondary to wellbutrin-none since    Past Surgical History  Procedure Date  . Cosmetic surgery     buttock liposuction  . Thyroid surgery     for thyroid cancer  . Nissen fundoplication 2004, 2012    REFLUX  . Hernia repair 2004, 2012  . Breast surgery     reduc tion mammoplasty  . Cholecystectomy   . Vaginal hysterectomy     vaginal  . Upper gastrointestinal endoscopy 08/26/2004  . Varicose vein surgery 03/20/2003    right leg    Family History  Problem Relation Age of Onset  . Heart disease Mother   . Hypertension Mother   . Ovarian cancer Mother   . Heart disease Father   . Hypertension Father   . Heart disease  Sister   . Hypertension Sister   . Diabetes Paternal Grandmother   . Cancer Other     History  Substance Use Topics  . Smoking status: Never Smoker   . Smokeless tobacco: Never Used  . Alcohol Use: No    OB History    Grav Para Term Preterm Abortions TAB SAB Ect Mult Living   1 1        1       Review of Systems  Unable to perform ROS: Unstable vital signs  Respiratory: Positive for shortness of breath.     Allergies  Bee; Pneumococcal vaccines; Tetanus toxoids; Wellbutrin; Bactrim; Biaxin; Doxycycline; Hydone; Ranitidine; Sulfa antibiotics; and Tetracyclines & related  Home Medications   Current Outpatient Rx  Name Route Sig Dispense Refill  . ALBUTEROL 90 MCG/ACT IN AERS Inhalation Inhale 3-6 puffs into the lungs as needed. Shortness of breath    . ALPRAZOLAM 1 MG PO TABS  1/2-1 PO QHS 7 tablet 5    Patient may refill weekly.  . ASPIRIN EC 81 MG PO TBEC Oral Take 81 mg by mouth daily.      Marland Kitchen CETIRIZINE HCL 10 MG PO TABS Oral Take 10 mg by mouth daily.      Marland Kitchen DIPHENHYDRAMINE HCL 12.5 MG PO CHEW Oral Chew 25-100 mg by  mouth as needed. Seasonal allergies     . DIPHENHYDRAMINE HCL 25 MG PO TABS Oral Take 1 tablet (25 mg total) by mouth every 6 (six) hours. 20 tablet 0  . EPINEPHRINE 0.3 MG/0.3ML IJ DEVI Intramuscular Inject 0.3 mLs (0.3 mg total) into the muscle as needed. 2 Device 1  . EPINEPHRINE 0.3 MG/0.3ML IJ DEVI Intramuscular Inject 0.3 mLs (0.3 mg total) into the muscle as needed. 1 Device 0  . ESTRADIOL 0.1 MG/24HR TD PTTW Transdermal Place 1 patch (0.1 mg total) onto the skin 2 (two) times a week. 8 patch 11    Pt needs to make appointment.  Marland Kitchen EZETIMIBE 10 MG PO TABS Oral Take 10 mg by mouth daily.     Marland Kitchen LANSOPRAZOLE 30 MG PO TBDP Oral Take 60-120 mg by mouth 2 (two) times daily as needed. Acid reflux     . LEVOTHYROXINE SODIUM 88 MCG PO TABS Oral Take 88 mcg by mouth daily.      . LUBIPROSTONE 24 MCG PO CAPS Oral Take 24 mcg by mouth 2 (two) times daily as needed.  Fluid    . METOPROLOL SUCCINATE ER 25 MG PO TB24 Oral Take 25 mg by mouth daily.      Marland Kitchen ONDANSETRON 8 MG PO TBDP Oral Take 8 mg by mouth as needed. Nausea and vomiting     . PREDNISONE 10 MG PO TABS  2.5 mg every other day. Taper, 6,5,4,3,2,1     . PREDNISONE 10 MG PO TABS  2.5 mg every other day. Taper, 6,5,4,3,2,1. Patient gets 5 mg but splits it in half to take every other day.     Marland Kitchen PREDNISONE 50 MG PO TABS  1 tablet daily PO 5 tablet 0  . RANITIDINE HCL 50 MG/2ML IJ SOLN Intravenous Inject 50 mg into the vein once.      Marland Kitchen ROSUVASTATIN CALCIUM 20 MG PO TABS Oral Take 20 mg by mouth daily.      . TRAVOPROST 0.004 % OP SOLN  1 drop at bedtime.      Marland Kitchen VITAMIN B-12 1000 MCG PO TABS Oral Take 1,000 mcg by mouth daily.        Resp 21  SpO2 100%  LMP 05/23/2006  Physical Exam  Constitutional: She is oriented to person, place, and time. She appears well-developed and well-nourished. She appears distressed.       Mild respiratory distress  HENT:  Head: Normocephalic and atraumatic.  Mouth/Throat: Oropharynx is clear and moist. No oropharyngeal exudate.       Oropharynx symmetrical, uvula midline, floor of mouth soft  Eyes: Conjunctivae are normal. Pupils are equal, round, and reactive to light.  Neck: Normal range of motion. Neck supple.  Cardiovascular: Normal rate, regular rhythm and normal heart sounds.        Tachycardic  Pulmonary/Chest: Effort normal. She has no wheezes.  Abdominal: Soft. There is no tenderness. There is no rebound and no guarding.  Musculoskeletal: Normal range of motion. She exhibits no edema and no tenderness.  Neurological: She is alert and oriented to person, place, and time. No cranial nerve deficit.  Skin: Skin is warm. There is erythema.       Erythema to neck and to chest    ED Course  Procedures (including critical care time)  Labs Reviewed - No data to display Dg Chest Portable 1 View  01/02/2012  *RADIOLOGY REPORT*  Clinical Data: Shortness of  breath and allergic reaction.  PORTABLE CHEST - 1 VIEW  Comparison: 08/18/2011  Findings: Lung volumes are normal.  No edema, infiltrate or pleural effusion.  Heart size is normal.  Bony structures are unremarkable.  IMPRESSION: No active disease.  Original Report Authenticated By: Reola Calkins, M.D.     1. Allergic reaction       MDM  Allergic reaction to perfume. Tachycardic vital signs stable no respiratory distress. No oropharynx swelling. Patient did receive EpiPen about one hour ago.  Steroids, benadryl, pepcid, IVF.  Reassessed at 3:45 PM. Patient is in no distress her rate 110. Lungs clear. Plan to observe until 4:30 PM.  Anticipate discharge home with refill of EpiPen, steroid course and Benadryl.    Date: 01/02/2012  Rate: 115  Rhythm: sinus tachycardia  QRS Axis: normal  Intervals: normal  ST/T Wave abnormalities: normal  Conduction Disutrbances:none  Narrative Interpretation:   Old EKG Reviewed: unchanged  CRITICAL CARE Performed by: Glynn Octave   Total critical care time: 30  Critical care time was exclusive of separately billable procedures and treating other patients.  Critical care was necessary to treat or prevent imminent or life-threatening deterioration.  Critical care was time spent personally by me on the following activities: development of treatment plan with patient and/or surrogate as well as nursing, discussions with consultants, evaluation of patient's response to treatment, examination of patient, obtaining history from patient or surrogate, ordering and performing treatments and interventions, ordering and review of laboratory studies, ordering and review of radiographic studies, pulse oximetry and re-evaluation of patient's condition.      Glynn Octave, MD 01/02/12 [phone]

## 2012-01-14 ENCOUNTER — Other Ambulatory Visit: Payer: Self-pay | Admitting: Physician Assistant

## 2012-02-16 ENCOUNTER — Ambulatory Visit: Payer: BC Managed Care – PPO | Admitting: Gynecology

## 2012-02-17 ENCOUNTER — Other Ambulatory Visit: Payer: Self-pay | Admitting: *Deleted

## 2012-02-17 MED ORDER — LEVOTHYROXINE SODIUM 88 MCG PO TABS: 88.0000 ug | ORAL_TABLET | Freq: Every day | ORAL | Status: DC

## 2012-02-22 HISTORY — PX: COLPOSCOPY: SHX161

## 2012-02-23 ENCOUNTER — Ambulatory Visit: Payer: BC Managed Care – PPO | Admitting: Gynecology

## 2012-02-23 ENCOUNTER — Telehealth: Payer: Self-pay

## 2012-02-23 NOTE — Telephone Encounter (Signed)
Pharmacy called and stated they got a fax back stating we don't receive faxed RF reqs anymore, but they sent by [employer]. They think that when they hit the send button it must be automatically faxing instead d/t controlled subst. Pt requests RF of her Xanax 1 mg. Advised pharm, they must have a default setting on computer at their end and please contact their IT/manager to check on this. Dr Cleta Alberts, do you want to RF? Chart is in your box

## 2012-02-23 NOTE — Telephone Encounter (Signed)
Received CB from [facility] who wanted to verify pt only gets one wk at a time. Told pharmacy only 1 wk at a time #7, and he stated that they can only RF x 5 bc it is a contr subs. Pt will only have 6 weeks of RFs instead of 2 mos.

## 2012-02-23 NOTE — Telephone Encounter (Signed)
Called in Rx as written by Dr Cleta Alberts to [facility]

## 2012-02-23 NOTE — Telephone Encounter (Signed)
Okay to refill the Xanax 1 mg. She takes 1 mg QHS #7. She can refill every week x2 months

## 2012-02-29 ENCOUNTER — Encounter: Payer: Self-pay | Admitting: Gynecology

## 2012-02-29 ENCOUNTER — Other Ambulatory Visit (HOSPITAL_COMMUNITY)
Admission: RE | Admit: 2012-02-29 | Discharge: 2012-02-29 | Disposition: A | Discharge status: Home / Self Care | Payer: BC Managed Care – PPO | Source: Ambulatory Visit | Attending: Gynecology | Admitting: Gynecology

## 2012-02-29 ENCOUNTER — Ambulatory Visit (INDEPENDENT_AMBULATORY_CARE_PROVIDER_SITE_OTHER): Payer: BC Managed Care – PPO | Admitting: Gynecology

## 2012-02-29 DIAGNOSIS — N890 Mild vaginal dysplasia: Secondary | ICD-10-CM

## 2012-02-29 DIAGNOSIS — N893 Dysplasia of vagina, unspecified: Secondary | ICD-10-CM

## 2012-02-29 DIAGNOSIS — Z01419 Encounter for gynecological examination (general) (routine) without abnormal findings: Secondary | ICD-10-CM | POA: Insufficient documentation

## 2012-02-29 NOTE — Patient Instructions (Signed)
Follow up for biopsy results. If normal then re\re Pap in 6 months. If abnormal then we'll discuss.

## 2012-02-29 NOTE — Progress Notes (Signed)
Patient presents for Pap smear in a six-month interval with history of VAIN I. She had colposcopy with biopsy showing VAIN I 07/24/2010. Her last Pap smear November again showed VAIN I.  Exam of Michelle [name] chaperone present External BUS vagina normal with mild atrophic changes. Pap of cuff done. No palpable vaginal abnormalities. Bimanual without masses or tenderness  Assessment and plan: Persistent VAIN I. Pap done today. If normal plan repeat in 6 months if abnormal will triage based on results.

## 2012-03-01 ENCOUNTER — Encounter: Payer: Self-pay | Admitting: Emergency Medicine

## 2012-03-01 ENCOUNTER — Ambulatory Visit (INDEPENDENT_AMBULATORY_CARE_PROVIDER_SITE_OTHER): Payer: Self-pay | Admitting: Emergency Medicine

## 2012-03-01 ENCOUNTER — Other Ambulatory Visit: Payer: Self-pay | Admitting: Physician Assistant

## 2012-03-01 VITALS — BP 136/87 | HR 96 | Temp 97.6°F | Resp 20

## 2012-03-01 DIAGNOSIS — T782XXA Anaphylactic shock, unspecified, initial encounter: Secondary | ICD-10-CM

## 2012-03-01 DIAGNOSIS — R1084 Generalized abdominal pain: Secondary | ICD-10-CM

## 2012-03-01 DIAGNOSIS — Z87892 Personal history of anaphylaxis: Secondary | ICD-10-CM | POA: Insufficient documentation

## 2012-03-01 DIAGNOSIS — E785 Hyperlipidemia, unspecified: Secondary | ICD-10-CM

## 2012-03-01 DIAGNOSIS — F411 Generalized anxiety disorder: Secondary | ICD-10-CM

## 2012-03-01 DIAGNOSIS — E039 Hypothyroidism, unspecified: Secondary | ICD-10-CM

## 2012-03-01 DIAGNOSIS — F329 Major depressive disorder, single episode, unspecified: Secondary | ICD-10-CM

## 2012-03-01 DIAGNOSIS — K219 Gastro-esophageal reflux disease without esophagitis: Secondary | ICD-10-CM

## 2012-03-01 LAB — POCT URINALYSIS DIPSTICK
Blood, UA: NEGATIVE
Ketones, UA: NEGATIVE
Protein, UA: NEGATIVE
Spec Grav, UA: 1.01
pH, UA: 5.5

## 2012-03-01 LAB — CBC WITH DIFFERENTIAL/PLATELET
Basophils Absolute: 0.1 10*3/uL (ref 0.0–0.1)
Eosinophils Absolute: 0.4 10*3/uL (ref 0.0–0.7)
Eosinophils Relative: 4 % (ref 0–5)
HCT: 41.8 % (ref 36.0–46.0)
Lymphocytes Relative: 39 % (ref 12–46)
MCH: 31.3 pg (ref 26.0–34.0)
MCV: 92.7 fL (ref 78.0–100.0)
Monocytes Absolute: 0.7 10*3/uL (ref 0.1–1.0)
RDW: 12.8 % (ref 11.5–15.5)
WBC: 8.8 10*3/uL (ref 4.0–10.5)

## 2012-03-01 MED ORDER — EZETIMIBE 10 MG PO TABS: 10.0000 mg | ORAL_TABLET | Freq: Every day | ORAL | Status: DC

## 2012-03-01 NOTE — Progress Notes (Signed)
  Subjective:    Patient ID: Michelle [name], female    DOB: November 03, 1961, 51 y.o.   MRN: [id number]  HPI patient remains extremely depressed. She has gone to counseling in the past but not recently. He took an overdose in December. She went to see a psychiatrist who diagnosed her with bipolar disease but she believes that diagnosis is incorrect. She struggles with her relationship with her husband and family members. She feels that ever since she received the Pneumovax vaccine her allergies have been incredibly worse. She had sought the help of the allergist but the shots seemed to make things worse. She feels she is confined to her house . She is afraid to go out because she might be exposed to an allergen and have an allergic reaction. She carries an EpiPen with her all time. She's been having a lot of difficulty with sleep. She has been on Xanax 1 mg one at bedtime which I fill on a weekly basis because of her history of OD. She does say that she would be happy to diet she's very unhappy with her life at the present time. She has taken herself away from any impingement with any of her family members. She struggles with her relationship with her husband. She would be willing to see a counselor. She does state that she sees a Veterinary surgeon at church and this has been helpful to her.    Review of Systems she continues to have GI symptoms even though she has had a redo of her Nissen fundoplication. Her main issues center around depression. She continues to struggle with allergy exposures and sensitivity to odors. She continues to see Dr. Audie Box and is noted to have significant changes on Pap even though she has had a hysterectomy.    Objective:   Physical Exam  Constitutional: She appears well-developed and well-nourished.  HENT:  Right Ear: External ear normal.  Left Ear: External ear normal.  Eyes: Pupils are equal, round, and reactive to light.  Neck: No JVD present. No tracheal deviation present. No  thyromegaly present.  Cardiovascular: Normal rate, regular rhythm and normal heart sounds.  Exam reveals no gallop and no friction rub.   No murmur heard. Pulmonary/Chest: Effort normal and breath sounds normal. No respiratory distress. She has no wheezes. She has no rales. She exhibits no tenderness.  Abdominal: She exhibits no distension and no mass. There is no tenderness. There is no rebound and no guarding.  Lymphadenopathy:    She has no cervical adenopathy.          Assessment & Plan:   Rieves still in a bad state. She is still depressed. She still has suicidal thoughts. She does not respond well to antidepressants. She is adamant that she does not have a diagnosis of bipolar disease. She struggles with her allergy problems. She is constantly in fear that she will have an allergic reaction and have to use her EpiPen. She is going to talk to Michelle [name] and get his advice about ways to cope with what she is going through. I encouraged her to go online and maybe associate herself with one of the NGO,s. It would be possible that she could work with one of the organizations and do volunteer type work over the Internet to help with different aide agencies. She seems to get great satisfaction from helping. Her husband recently donated $400 of money for medications to take to [place].

## 2012-03-02 ENCOUNTER — Telehealth: Payer: Self-pay

## 2012-03-02 LAB — COMPREHENSIVE METABOLIC PANEL
AST: 54 U/L — ABNORMAL HIGH (ref 0–37)
BUN: 12 mg/dL (ref 6–23)
CO2: 27 mEq/L (ref 19–32)
Calcium: 9.4 mg/dL (ref 8.4–10.5)
Chloride: 106 mEq/L (ref 96–112)
Creat: 0.7 mg/dL (ref 0.50–1.10)

## 2012-03-02 LAB — LIPID PANEL
HDL: 54 mg/dL (ref >39)
Triglycerides: 387 mg/dL — ABNORMAL HIGH (ref <150)

## 2012-03-02 LAB — TSH: TSH: 4.579 u[IU]/mL — ABNORMAL HIGH (ref 0.350–4.500)

## 2012-03-02 NOTE — Telephone Encounter (Signed)
Pt returned call. Please call

## 2012-03-03 NOTE — Telephone Encounter (Signed)
Advised pt of labs

## 2012-03-07 ENCOUNTER — Ambulatory Visit (INDEPENDENT_AMBULATORY_CARE_PROVIDER_SITE_OTHER): Payer: BC Managed Care – PPO | Admitting: Gynecology

## 2012-03-07 ENCOUNTER — Encounter: Payer: Self-pay | Admitting: Gynecology

## 2012-03-07 DIAGNOSIS — N890 Mild vaginal dysplasia: Secondary | ICD-10-CM

## 2012-03-07 DIAGNOSIS — N893 Dysplasia of vagina, unspecified: Secondary | ICD-10-CM

## 2012-03-07 MED ORDER — VALACYCLOVIR HCL 500 MG PO TABS: 500.0000 mg | ORAL_TABLET | Freq: Two times a day (BID) | ORAL | Status: DC

## 2012-03-07 NOTE — Patient Instructions (Signed)
Follow up for biopsy results.

## 2012-03-07 NOTE — Progress Notes (Signed)
Patient presents for colposcopy. History of VAIN I being followed expectantly. Her last Pap smear showed VAIN I and her last colposcopy was September 2011. Acetaminophen for colposcopy has been over a year since we've looked.  Exam Cherri chaperone present Pelvic external BUS vagina normal. Bimanual without masses or tenderness. No palpable vaginal abnormalities noted.  Colposcopy with acetic acid cleanse of the vagina shows upper cuff with several small acetowhite patches right angle to midline. 2 representative biopsies taken. Silver nitrate applied.  Assessment and plan: VAIN I, persistent. Colposcopy consistent with low-grade changes. Representative biopsies taken. Patient will follow up for biopsy results. As long as low-grade we'll plan expectant management. If high-grade that will triage based on results possibilities for vaginal laser reviewed with her.

## 2012-03-08 ENCOUNTER — Encounter: Payer: Self-pay | Admitting: Gynecology

## 2012-03-21 ENCOUNTER — Other Ambulatory Visit: Payer: Self-pay | Admitting: Physician Assistant

## 2012-03-22 ENCOUNTER — Telehealth: Payer: Self-pay | Admitting: Emergency Medicine

## 2012-03-22 ENCOUNTER — Other Ambulatory Visit: Payer: Self-pay | Admitting: Physician Assistant

## 2012-03-22 NOTE — Telephone Encounter (Signed)
Please call patient. The Xanax she takes at bedtime is a one-week prescription and she can get it refilled every week. Those meds should be at the pharmacy. Please let Michelle [name] know to ask for medication clarification through the office so we can document it appropriately on Epic and so we can keep track of all her medications and refills.

## 2012-03-22 NOTE — Telephone Encounter (Signed)
Gave pt info and message from Dr Cleta Alberts. Pt thanked [place] and agreed to get clarification in the future through the office so it can be documented correctly

## 2012-03-23 DIAGNOSIS — Z0271 Encounter for disability determination: Secondary | ICD-10-CM

## 2012-04-10 ENCOUNTER — Emergency Department (HOSPITAL_BASED_OUTPATIENT_CLINIC_OR_DEPARTMENT_OTHER)
Admission: EM | Admit: 2012-04-10 | Discharge: 2012-04-10 | Disposition: A | Discharge status: Home / Self Care | Payer: BC Managed Care – PPO | Attending: Emergency Medicine | Admitting: Emergency Medicine

## 2012-04-10 ENCOUNTER — Encounter (HOSPITAL_BASED_OUTPATIENT_CLINIC_OR_DEPARTMENT_OTHER): Payer: Self-pay | Admitting: Emergency Medicine

## 2012-04-10 ENCOUNTER — Other Ambulatory Visit: Payer: Self-pay | Admitting: Physician Assistant

## 2012-04-10 DIAGNOSIS — E039 Hypothyroidism, unspecified: Secondary | ICD-10-CM | POA: Insufficient documentation

## 2012-04-10 DIAGNOSIS — X58XXXA Exposure to other specified factors, initial encounter: Secondary | ICD-10-CM | POA: Insufficient documentation

## 2012-04-10 DIAGNOSIS — Z79899 Other long term (current) drug therapy: Secondary | ICD-10-CM | POA: Insufficient documentation

## 2012-04-10 DIAGNOSIS — T782XXA Anaphylactic shock, unspecified, initial encounter: Secondary | ICD-10-CM

## 2012-04-10 DIAGNOSIS — R0602 Shortness of breath: Secondary | ICD-10-CM | POA: Insufficient documentation

## 2012-04-10 DIAGNOSIS — I10 Essential (primary) hypertension: Secondary | ICD-10-CM | POA: Insufficient documentation

## 2012-04-10 DIAGNOSIS — R21 Rash and other nonspecific skin eruption: Secondary | ICD-10-CM | POA: Insufficient documentation

## 2012-04-10 MED ORDER — PREDNISONE 20 MG PO TABS: ORAL_TABLET | ORAL | Status: AC

## 2012-04-10 MED ORDER — DIPHENHYDRAMINE HCL 25 MG PO CAPS
ORAL_CAPSULE | ORAL | Status: AC
  Filled 2012-04-10: qty 2

## 2012-04-10 MED ORDER — METHYLPREDNISOLONE SODIUM SUCC 125 MG IJ SOLR
125.0000 mg | Freq: Once | INTRAMUSCULAR | Status: AC
  Administered 2012-04-10: 125 mg via INTRAVENOUS
  Filled 2012-04-10: qty 2

## 2012-04-10 MED ORDER — ONDANSETRON HCL 4 MG/2ML IJ SOLN
INTRAMUSCULAR | Status: AC
  Filled 2012-04-10: qty 2

## 2012-04-10 MED ORDER — EPINEPHRINE 0.3 MG/0.3ML IJ DEVI: 0.3000 mg | INTRAMUSCULAR | Status: DC | PRN

## 2012-04-10 MED ORDER — SODIUM CHLORIDE 0.9 % IV SOLN
INTRAVENOUS | Status: DC
  Administered 2012-04-10: 13:00:00 via INTRAVENOUS

## 2012-04-10 NOTE — ED Notes (Addendum)
previous emtala notes INCORRECT PATIENT

## 2012-04-10 NOTE — Discharge Instructions (Signed)
Anaphylactic Reaction  An anaphylactic reaction is a severe allergic reaction. It may be caused by medicines, food, insect bites, or other common items. It cannot spread from one person to another (contagious). It can be life threatening and require hospitalization.   SYMPTOMS   Symptoms may include, but are not limited to, the following:   Skin rash or hives.   Itching.   Chest tightness.   Swelling (including the eyes, tongue, or lips).   Trouble breathing or swallowing.   Lightheadedness or fainting.   Stomach pains or vomiting.  Symptoms may gradually disappear when you are no longer around the substance that caused the problem. You may find that 2 or 3 days are needed for symptoms to go away completely.  HOME CARE INSTRUCTIONS    Carry a card or wear a bracelet that lists anything that has caused past anaphylaxis or a less severe allergic reaction.   If 1 or more medicines have caused past problems, you need to avoid the same or similar medicines in the future.   Talk with a medical caregiver before using any new prescription or over-the-counter medicines.   If you develop hives or a rash:   Apply cold compresses to the skin, or take a cool bath to help reduce itching.   Avoid hot baths or showers. This might make itching or the rash worse.   Wear loose-fitting clothes.   If you have had a severe allergic reaction in the past:   Carry an anaphylaxis kit with you at all times. Both you and family members should be shown how to give the medicines in the kit. This can be lifesaving if there is another severe reaction. If it needs to be used, and symptoms improve, it is still important for you to seek immediate medical care or call your local emergency services (911 in the [place] This is because severe symptoms can return when the medicine in the kit wears off.   If hospitalization is not required, you should have fast access to emergency services if the problem recurs. If a family member or friend  has been shown how to give the medicines in an anaphylaxis kit and can stay with you, he or she can also help give the medicines from the kit if problems return.   Make sure you renew your anaphylaxis kit when it gets close to being out of date.   You may return to your normal activities when the allergic symptoms are gone.  SEEK MEDICAL CARE IF:    You develop symptoms of an allergic reaction to a new substance. Symptoms may occur right away or minutes later.   Rash, hives, or itching occurs.   You have different symptoms than you have had previously.  SEEK IMMEDIATE MEDICAL CARE IF:    You have difficulty breathing, start to wheeze, or a tight feeling in the chest or throat develops.   You develop swelling of the mouth or tongue or swelling or itching over most of your body.   You develop severe stomach pains or repeated vomiting.   You feel very lightheaded or pass out.   Chest pain develops or there is a worsening of problems noted above. THIS IS AN EMERGENCY. Call your local emergency services (911 in the [place]  MAKE SURE YOU:    Understand these instructions.   Will watch your condition.   Will get help right away if you have recurrent problems or have problems that are getting worse.  Document Released:   Information 2012 [place], [place].

## 2012-04-10 NOTE — ED Notes (Signed)
Pt sitting in church, had allergic reaction to smell of perfume.  Pt took two epipens and called ems.  Per EMS, pt received benadryl 50mg  po and zofran 4 mg IV.  Pt stable, vss, lungs clear, airway open.  No stridor noted.

## 2012-04-10 NOTE — ED Provider Notes (Signed)
History     CSN: [id number]  Arrival date & time 04/10/12  1227   First MD Initiated Contact with Patient 04/10/12 1244      Chief Complaint  Patient presents with  . Allergic Reaction    (Consider location/radiation/quality/duration/timing/severity/associated sxs/prior treatment) HPI Comments: imprved after administering 2 epi pens 20 min apart.  Currently improved and asymptomatic  Patient is a 51 y.o. female presenting with allergic reaction. The history is provided by the patient. No language interpreter was used.  Allergic Reaction The primary symptoms are  shortness of breath and rash. The primary symptoms do not include wheezing, cough, abdominal pain, nausea, vomiting, diarrhea, dizziness, palpitations or urticaria. The current episode started 1 to 2 hours ago. The problem has been gradually improving. This is a new problem.  The shortness of breath began today. The shortness of breath developed suddenly. The shortness of breath is moderate.  Associated with: exposure to perfume. Significant symptoms that are not present include rhinorrhea.    Past Medical History  Diagnosis Date  . Hypercholesteremia   . Reflux   . Gastroparesis   . VAIN I (vaginal intraepithelial neoplasia grade I) 02/2012    Colposcopic biopsies  . GERD (gastroesophageal reflux disease)   . Hypertension   . Osteopenia 05/2009    -1.9  . Hypothyroidism   . Thyroid cancer     papillary  . PONV (postoperative nausea and vomiting)   . Osteoarthritis   . Asthma   . Gastroparesis     history of  . Headache     migraines  . Seizures     1 seizure secondary to wellbutrin-none since  . Allergy   . Anaphylaxis   . Depression     Past Surgical History  Procedure Date  . Cosmetic surgery     buttock liposuction  . Thyroid surgery     for thyroid cancer  . Nissen fundoplication 2004, 2012    REFLUX  . Hernia repair 2004, 2012  . Breast surgery     reduc tion mammoplasty  . Cholecystectomy    . Vaginal hysterectomy   . Upper gastrointestinal endoscopy 08/26/2004  . Varicose vein surgery 03/20/2003    right leg  . Colposcopy 02/2012    VAIN I    Family History  Problem Relation Age of Onset  . Heart disease Mother   . Hypertension Mother   . Ovarian cancer Mother   . Heart disease Father   . Hypertension Father   . Heart disease Sister   . Hypertension Sister   . Diabetes Paternal Grandmother   . Cancer Other     History  Substance Use Topics  . Smoking status: Never Smoker   . Smokeless tobacco: Never Used  . Alcohol Use: No    OB History    Grav Para Term Preterm Abortions TAB SAB Ect Mult Living   1 1        1       Review of Systems  Constitutional: Negative for fever, chills, activity change, appetite change and fatigue.  HENT: Negative for congestion, sore throat, rhinorrhea, neck pain and neck stiffness.   Respiratory: Positive for shortness of breath. Negative for cough and wheezing.   Cardiovascular: Negative for chest pain and palpitations.  Gastrointestinal: Negative for nausea, vomiting, abdominal pain and diarrhea.  Genitourinary: Negative for dysuria, urgency, frequency and flank pain.  Musculoskeletal: Negative for myalgias, back pain and arthralgias.  Skin: Positive for rash.  Neurological: Negative for dizziness, weakness,  light-headedness and headaches.  All other systems reviewed and are negative.    Allergies  Nutritional supplements; Pneumococcal vaccines; Tetanus toxoids; Wellbutrin; Bactrim; Clarithromycin; Doxycycline; Hydone; Ranitidine; Sulfa antibiotics; and Tetracyclines & related  Home Medications   Current Outpatient Rx  Name Route Sig Dispense Refill  . ALBUTEROL 90 MCG/ACT IN AERS Inhalation Inhale 3-6 puffs into the lungs as needed. Shortness of breath    . ALPRAZOLAM 1 MG PO TABS  1/2-1 PO QHS 7 tablet 5    Patient may refill weekly.  . ASPIRIN EC 81 MG PO TBEC Oral Take 81 mg by mouth daily.      Marland Kitchen CETIRIZINE HCL  10 MG PO TABS Oral Take 10 mg by mouth daily.      Marland Kitchen DIPHENHYDRAMINE HCL 25 MG PO TABS Oral Take 1 tablet (25 mg total) by mouth every 6 (six) hours as needed for itching. 30 tablet 0  . DIPHENHYDRAMINE HCL 25 MG PO TABS Oral Take 1 tablet (25 mg total) by mouth every 6 (six) hours. 20 tablet 0  . EPINEPHRINE 0.3 MG/0.3ML IJ DEVI Intramuscular Inject 0.3 mLs (0.3 mg total) into the muscle as needed. 2 Device 1  . EPINEPHRINE 0.3 MG/0.3ML IJ DEVI Intramuscular Inject 0.3 mLs (0.3 mg total) into the muscle as needed. 1 Device 0  . EPINEPHRINE 0.3 MG/0.3ML IJ DEVI Intramuscular Inject 0.3 mLs (0.3 mg total) into the muscle as needed. 2 Device 0  . ESTRADIOL 0.1 MG/24HR TD PTTW Transdermal Place 1 patch (0.1 mg total) onto the skin 2 (two) times a week. 8 patch 11    Pt needs to make appointment.  Marland Kitchen EZETIMIBE 10 MG PO TABS Oral Take 1 tablet (10 mg total) by mouth daily. 30 tablet 5    Please send future Rx requests electronically - th ...  . LANSOPRAZOLE 30 MG PO TBDP Oral Take 60-120 mg by mouth 2 (two) times daily as needed. Acid reflux     . LEVOTHYROXINE SODIUM 88 MCG PO TABS Oral Take 1 tablet (88 mcg total) by mouth daily. 30 tablet 3  . LUBIPROSTONE 24 MCG PO CAPS Oral Take 24 mcg by mouth 2 (two) times daily as needed. Fluid    . METOPROLOL SUCCINATE ER 25 MG PO TB24  TAKE ONE TABLET BY MOUTH ONE TIME DAILY 30 tablet 2  . ONDANSETRON 8 MG PO TBDP  DISSOLVE ONE TABLET IN MOUTH  EVERY SIX TO EIGHT HOURS AS  NEEDED 30 tablet 4  . PREDNISONE 10 MG PO TABS  2.5 mg every other day. Taper, 6,5,4,3,2,1     . PREDNISONE 10 MG PO TABS  2.5 mg every other day. Taper, 6,5,4,3,2,1. Patient gets 5 mg but splits it in half to take every other day.     Marland Kitchen PREDNISONE 20 MG PO TABS  3 Tabs PO Days 1-3, then 2 tabs PO Days 4-6, then 1 tab PO Day 7-9, then Half Tab PO Day 10-12 20 tablet 0  . RANITIDINE HCL 50 MG/2ML IJ SOLN Intravenous Inject 50 mg into the vein once.      Marland Kitchen ROSUVASTATIN CALCIUM 20 MG PO TABS  Oral Take 1 tablet (20 mg total) by mouth daily. NEEDS OFFICE VISIT/RECHECK LABS 30 each 0  . TRAVOPROST 0.004 % OP SOLN  1 drop at bedtime.      Marland Kitchen VITAMIN B-12 1000 MCG PO TABS Oral Take 1,000 mcg by mouth daily.        LMP 05/23/2006  Physical Exam  Nursing  note and vitals reviewed. Constitutional: She is oriented to person, place, and time. She appears well-developed and well-nourished. No distress.  HENT:  Head: Normocephalic and atraumatic.  Mouth/Throat: Oropharynx is clear and moist.  Eyes: Conjunctivae and EOM are normal. Pupils are equal, round, and reactive to light.  Neck: Normal range of motion. Neck supple.  Cardiovascular: Normal rate, regular rhythm, normal heart sounds and intact distal pulses.  Exam reveals no gallop and no friction rub.   No murmur heard. Pulmonary/Chest: Effort normal and breath sounds normal. No respiratory distress. She exhibits no tenderness.  Abdominal: Soft. Bowel sounds are normal. There is no tenderness. There is no rebound and no guarding.  Musculoskeletal: Normal range of motion. She exhibits no edema and no tenderness.  Neurological: She is alert and oriented to person, place, and time. No cranial nerve deficit.  Skin: Skin is warm and dry.    ED Course  Procedures (including critical care time)  Labs Reviewed - No data to display No results found.   1. Anaphylaxis       MDM  Anaphylaxis. She received 2 EpiPen since she self administered. She received Benadryl by EMS. She received a dose of steroids in the emergency department. She was monitored for greater than 3 hours the emergency department. She resolution of her symptoms. Symptoms have not returned. She'll be discharged home on a steroid taper. She is instructed to take Benadryl at home. Provided strict return precautions. I encouraged her to refrain from any environments where there is perfume        Dayton Bailiff, MD 04/10/12 1537

## 2012-04-10 NOTE — ED Notes (Signed)
Pt d/c home with rx x 2 for epi-pen and prednisone- pt has a friend coming to pick her up

## 2012-04-22 ENCOUNTER — Other Ambulatory Visit: Payer: Self-pay | Admitting: Physician Assistant

## 2012-04-26 ENCOUNTER — Other Ambulatory Visit: Payer: Self-pay | Admitting: *Deleted

## 2012-04-26 MED ORDER — ALPRAZOLAM 1 MG PO TABS: ORAL_TABLET | ORAL | Status: DC

## 2012-05-10 ENCOUNTER — Telehealth: Payer: Self-pay

## 2012-05-10 MED ORDER — ROSUVASTATIN CALCIUM 20 MG PO TABS: 20.0000 mg | ORAL_TABLET | Freq: Every day | ORAL | Status: DC

## 2012-05-10 NOTE — Telephone Encounter (Signed)
Spoke with pt advised RX sent in. 

## 2012-05-10 NOTE — Telephone Encounter (Signed)
PATIENT HAS AN APPOINTMENT ON July 2 WITH [facility].  HER CRESTOR WILL RUN OUT BEFORE THEN.  SHE SAYS THE LAST TIME IT WAS FILLED SHE WAS ONLY GIVEN 15.  CAN WE PLEASE CALL HER IN SOME?

## 2012-05-24 ENCOUNTER — Encounter: Payer: Self-pay | Admitting: Emergency Medicine

## 2012-05-24 ENCOUNTER — Ambulatory Visit (INDEPENDENT_AMBULATORY_CARE_PROVIDER_SITE_OTHER): Payer: BC Managed Care – PPO | Admitting: Emergency Medicine

## 2012-05-24 ENCOUNTER — Other Ambulatory Visit: Payer: Self-pay | Admitting: Emergency Medicine

## 2012-05-24 VITALS — BP 125/88 | HR 79 | Temp 97.6°F | Resp 16

## 2012-05-24 DIAGNOSIS — J9801 Acute bronchospasm: Secondary | ICD-10-CM

## 2012-05-24 DIAGNOSIS — J45909 Unspecified asthma, uncomplicated: Secondary | ICD-10-CM

## 2012-05-24 DIAGNOSIS — I10 Essential (primary) hypertension: Secondary | ICD-10-CM

## 2012-05-24 DIAGNOSIS — F329 Major depressive disorder, single episode, unspecified: Secondary | ICD-10-CM

## 2012-05-24 DIAGNOSIS — E785 Hyperlipidemia, unspecified: Secondary | ICD-10-CM

## 2012-05-24 LAB — LIPID PANEL
HDL: 57 mg/dL (ref >39)
LDL Cholesterol: 91 mg/dL (ref 0–99)
Total CHOL/HDL Ratio: 3.6 Ratio
Triglycerides: 273 mg/dL — ABNORMAL HIGH (ref <150)
VLDL: 55 mg/dL — ABNORMAL HIGH (ref 0–40)

## 2012-05-24 LAB — COMPREHENSIVE METABOLIC PANEL
Albumin: 4.7 g/dL (ref 3.5–5.2)
Alkaline Phosphatase: 56 U/L (ref 39–117)
BUN: 9 mg/dL (ref 6–23)
Creat: 0.64 mg/dL (ref 0.50–1.10)
Glucose, Bld: 85 mg/dL (ref 70–99)
Potassium: 4.1 mEq/L (ref 3.5–5.3)

## 2012-05-24 LAB — CBC WITH DIFFERENTIAL/PLATELET
Basophils Absolute: 0.1 10*3/uL (ref 0.0–0.1)
HCT: 42.3 % (ref 36.0–46.0)
Hemoglobin: 14.9 g/dL (ref 12.0–15.0)
Lymphocytes Relative: 37 % (ref 12–46)
Lymphs Abs: 4.4 10*3/uL — ABNORMAL HIGH (ref 0.7–4.0)
MCV: 87.2 fL (ref 78.0–100.0)
Monocytes Absolute: 0.7 10*3/uL (ref 0.1–1.0)
Monocytes Relative: 6 % (ref 3–12)
Neutro Abs: 6.3 10*3/uL (ref 1.7–7.7)
RBC: 4.85 MIL/uL (ref 3.87–5.11)
WBC: 11.7 10*3/uL — ABNORMAL HIGH (ref 4.0–10.5)

## 2012-05-24 NOTE — Progress Notes (Signed)
  Subjective:    Patient ID: Michelle [name], female    DOB: [birth year], 51 y.o.   MRN: [id number]  HPI patient enters for recheck. She is followed for her thyroid disease anxiety disorder insomnia hypertension and high cholesterol. She is seeing Karmen Bongo regularly since her suicide attempt the end of last year she initially saw a psychiatrist who diagnosed her with bipolar disease and she feels that diagnosis is in error. She still struggles with her relationship with her husband. They're considering a trial separation and she has been separated in the past. She is somewhat distraught and that she feels many of her friends and does she use to hang out with have deserted her. She still remains active in the church. She still struggles with allergies symptoms of recurrent huskiness and sinus drainage associated primarily with fume exposures.    Review of Systems     Objective:   Physical Exam  Constitutional: She appears well-developed and well-nourished.  HENT:  Head: Normocephalic.  Eyes: Pupils are equal, round, and reactive to light.  Neck: No tracheal deviation present. No thyromegaly present.  Cardiovascular: Normal rate, regular rhythm and normal heart sounds.   Pulmonary/Chest: Effort normal and breath sounds normal. No respiratory distress. She has no wheezes.  Abdominal: Soft. She exhibits no distension. There is tenderness.  Psychiatric: She has a normal mood and affect. Her behavior is normal. Judgment and thought content normal.          Assessment & Plan:  Patient struggling about what she wants to do. She is not sure she wants to go through separation. She is going to visit in [place]. I did agree to give her is Xanax 1 mg to take one at bedtime that she can fill after Thursday with 2 refills placed on that prescription. She can refill these prescriptions each week and that was written on the prescription. [facility] and told them to put that  prescription on hold for now she can start filling that prescription after July 21st

## 2012-05-25 LAB — T4, FREE: Free T4: 0.76 ng/dL — ABNORMAL LOW (ref 0.80–1.80)

## 2012-05-26 ENCOUNTER — Encounter: Payer: Self-pay | Admitting: Family Medicine

## 2012-06-02 ENCOUNTER — Other Ambulatory Visit: Payer: Self-pay | Admitting: Physician Assistant

## 2012-06-04 ENCOUNTER — Telehealth: Payer: Self-pay

## 2012-06-04 NOTE — Telephone Encounter (Signed)
Patient is going out of state on Wednesday 06/08/12. Patient needs new script for xanex for more than seven days. Message for Dr. Cleta Alberts. Please contact on cell at [phone].

## 2012-06-05 ENCOUNTER — Other Ambulatory Visit: Payer: Self-pay | Admitting: Physician Assistant

## 2012-06-05 ENCOUNTER — Telehealth: Payer: Self-pay | Admitting: Emergency Medicine

## 2012-06-05 MED ORDER — ALPRAZOLAM 1 MG PO TABS: ORAL_TABLET | ORAL | Status: DC

## 2012-06-05 NOTE — Telephone Encounter (Signed)
Called in RX to [facility] on [address].

## 2012-06-05 NOTE — Telephone Encounter (Signed)
LMOM to call back. RX faxed

## 2012-06-05 NOTE — Telephone Encounter (Signed)
Spoke with pt and she wants Dr Cleta Alberts to talk to Michelle [name] today before he leaves. She states this going to be difficult for them while traveling. Please call

## 2012-06-05 NOTE — Telephone Encounter (Signed)
Patient will be leaving or [facility] 06/08/13 per husband. Then to [place], [place], [place], [place], [place]  Dates gone 7/17-8/30/13.spoke to patient's husband Trey Paula to verify. Of note, patient only has 3 pills left from her current script.

## 2012-06-05 NOTE — Telephone Encounter (Signed)
Have discussed the situation with [name]. I'm going to call in Xanax 1 mg #14. Michelle [name] will be in control the medication and dispense them one at bedtime. When the 2 week prescription runs out they will call for a refill.

## 2012-06-05 NOTE — Telephone Encounter (Signed)
Please call Michelle [name] and ask her the number of days she will be gone  so we can verify her prescriptions. This needs to be confirmed with her husband Trey Paula. We can then write the prescription for that number of days.

## 2012-06-05 NOTE — Telephone Encounter (Signed)
See other message dated with information

## 2012-06-05 NOTE — Telephone Encounter (Signed)
Discussed the patient's upcoming travel (7/17-8/30) with Dr. Cleta Alberts.   In order to provide the safest care possible, the patient or her husband will call each week to advise [place] where to send the weekly prescription for alprazolam.    The patient's husband will need to pick up each prescription, maintain possession of it, and dispense each dose to the patient.  Alprazolam 1 mg, 1/2-1 PO QHS, #7 can be Rx'd weekly, first rx printed today.

## 2012-06-21 ENCOUNTER — Other Ambulatory Visit: Payer: Self-pay | Admitting: Physician Assistant

## 2012-06-21 MED ORDER — ALPRAZOLAM 1 MG PO TABS: ORAL_TABLET | ORAL | Status: DC

## 2012-06-26 ENCOUNTER — Other Ambulatory Visit: Payer: Self-pay | Admitting: Internal Medicine

## 2012-07-04 ENCOUNTER — Telehealth: Payer: Self-pay

## 2012-07-04 MED ORDER — ALPRAZOLAM 1 MG PO TABS: ORAL_TABLET | ORAL | Status: DC

## 2012-07-04 NOTE — Telephone Encounter (Signed)
At TL station - she will want it to go to some pharmacy in [place] because she is supposed to be there until 8/30.

## 2012-07-04 NOTE — Telephone Encounter (Signed)
PT STATES SHE DOESN'T HAVE ANYMORE ALPRAZOLAMS LEFT AND THEY IS THE ONLY WAY SHE CAN SLEEP AT NIGHT. PLEASE CALL [phone]    [facility] ON [address]

## 2012-07-04 NOTE — Telephone Encounter (Signed)
Pt has come back and is back in town now, she states she sent message to Dr Cleta Alberts to advise. She states she did not get the last Rx for #14 and wants you to know this, She also states she needs refills for this  Please advise on refills.

## 2012-07-05 ENCOUNTER — Other Ambulatory Visit: Payer: Self-pay | Admitting: Physician Assistant

## 2012-07-05 ENCOUNTER — Other Ambulatory Visit: Payer: Self-pay | Admitting: *Deleted

## 2012-07-05 MED ORDER — ALPRAZOLAM 1 MG PO TABS: ORAL_TABLET | ORAL | Status: DC

## 2012-07-05 NOTE — Telephone Encounter (Signed)
The most refills I can put on this is five,computer will not let me put in 11 when she goes through this, we need to send in the additional 6 to [facility] this is FYI only.

## 2012-07-05 NOTE — Telephone Encounter (Signed)
Patient can resume her regular Xanax prescription. She gets Xanax 1 mg one by mouth each bedtime. She gets #7 tablet. She can have 11 refills. She is allowed to fill the prescription each week.

## 2012-07-20 ENCOUNTER — Other Ambulatory Visit: Payer: Self-pay | Admitting: Emergency Medicine

## 2012-07-20 NOTE — Telephone Encounter (Signed)
Ok x 4 months   

## 2012-07-26 ENCOUNTER — Other Ambulatory Visit: Payer: Self-pay | Admitting: Internal Medicine

## 2012-08-06 ENCOUNTER — Telehealth: Payer: Self-pay

## 2012-08-06 NOTE — Telephone Encounter (Signed)
PATIENT HAS CALLED TO ASK WHY HER EPINEPHRINE KEEPS GETTING KICKED BACK FROM PHARMACY WHEN SHE NEEDS TWO. ALSO NEEDS REFILL? PATIENT DOES NOT UNDERSTAND WHY SHE CANT HAVE HER EPI PENS. PLEASE CONTACT HER HOME NUMBER AT [phone]. THANK YOU!

## 2012-08-07 MED ORDER — EPINEPHRINE 0.3 MG/0.3ML IJ DEVI: 0.3000 mg | INTRAMUSCULAR | Status: DC | PRN

## 2012-08-07 NOTE — Telephone Encounter (Signed)
I do not see where we denied it. But I have sent it to the pharmacy.

## 2012-08-07 NOTE — Addendum Note (Signed)
Addended by: Morrell Riddle on: 08/07/2012 08:40 AM   Modules accepted: Orders

## 2012-08-07 NOTE — Telephone Encounter (Signed)
Spoke with pt advised Epi Pen was sent to pharmacy

## 2012-08-12 ENCOUNTER — Other Ambulatory Visit: Payer: Self-pay | Admitting: *Deleted

## 2012-08-12 MED ORDER — ALPRAZOLAM 1 MG PO TABS: ORAL_TABLET | ORAL | Status: DC

## 2012-08-15 ENCOUNTER — Telehealth: Payer: Self-pay

## 2012-08-15 NOTE — Telephone Encounter (Signed)
Pt requesting copy of form that  Shows she had pneumonia/flu shot,the form that shows reaction to flu/pneumonia and the copy of visit that resulted from reaction   Best phone is [phone]

## 2012-08-16 NOTE — Telephone Encounter (Signed)
Michelle [name] wants to "add to" the records she asked to be released to her.  She needs the visit where she was treated with the adrenaline injection and the visit when Dr. Cleta Alberts sent her to the Emergency Room.  States these were 2 seperate incidents.    Please call when ready.  [phone]

## 2012-08-16 NOTE — Telephone Encounter (Signed)
Spoke with patient. Forms copied for pickup.

## 2012-08-17 NOTE — Telephone Encounter (Signed)
Spoke with patient. Records ready for pickup. °

## 2012-08-18 ENCOUNTER — Telehealth: Payer: Self-pay

## 2012-08-18 MED ORDER — ALPRAZOLAM 1 MG PO TABS: ORAL_TABLET | ORAL | Status: DC

## 2012-08-18 NOTE — Telephone Encounter (Signed)
Done, per Dr Cleta Alberts previous note, looks like this is refill #8 of the 11 approved by Dr Cleta Alberts.

## 2012-08-18 NOTE — Telephone Encounter (Signed)
PATIENT IS REQUESTING A RX REFILL FROM [facility] FOR ALPRAZolam Prudy Feeler). THANK YOU!

## 2012-08-20 NOTE — Telephone Encounter (Signed)
Received call from answering service, pt had called during night to give update on  Her pharmacy- [facility] [address]  Phone [phone] Pt phone to call back if questions [phone]

## 2012-08-23 ENCOUNTER — Telehealth: Payer: Self-pay

## 2012-08-23 NOTE — Telephone Encounter (Signed)
Pt would like for Dr. Cleta Alberts to contact her she didn't state why. [phone]

## 2012-08-24 ENCOUNTER — Telehealth: Payer: Self-pay

## 2012-08-24 MED ORDER — ALPRAZOLAM 1 MG PO TABS: ORAL_TABLET | ORAL | Status: DC

## 2012-08-24 NOTE — Telephone Encounter (Signed)
Dr. Donalee Citrin states he would recommend medication for acute anxiety for patient  CBN [phone]  Karmen Bongo

## 2012-08-24 NOTE — Telephone Encounter (Signed)
Spoke with pharmacy and they added on 5 refills to Xanax rx so pt can call them to get rx refill. Called pt and notified her that this was done.

## 2012-08-24 NOTE — Telephone Encounter (Signed)
[facility] had sent me a female requesting an increase in her Xanax from 1 mg at bedtime to 1.5 mg at bedtime due to her inability to sleep. She is undergoing a very stressful time related to issues with her husband. She consumes to see her therapist on a regular basis. I will contact him today to see if he feels this is indicated. Her complicating issue regards an overdose which occurred in December 2012. We have been very cautious about her medications allowing one-week supplies. My plan is to increase her Xanax to 1.5 mg at bedtime we will call her in #9 pills which she will be allowed to refill every 6 days. I will contact Dr. Lu Duffel and let him know this would be the plan. [facility] and [facility] her [facility]ax prescription to 1 mg patient to take 1-1/2 tablets at bedtime she is allowed #9 pills and she can fill this every 6 days. She can have 10 refills on this prescription.

## 2012-08-24 NOTE — Telephone Encounter (Signed)
Thanks, I have noted this and have called patient to advise.

## 2012-08-30 ENCOUNTER — Other Ambulatory Visit: Payer: Self-pay | Admitting: Radiology

## 2012-08-30 MED ORDER — ROSUVASTATIN CALCIUM 20 MG PO TABS: 20.0000 mg | ORAL_TABLET | Freq: Every day | ORAL | Status: DC

## 2012-08-30 NOTE — Telephone Encounter (Signed)
Patient called about meds, she has appt with Dr Cleta Alberts on 09/27/12 to check labs, the Rx for Crestor is sent for her.

## 2012-08-31 ENCOUNTER — Telehealth: Payer: Self-pay

## 2012-08-31 NOTE — Telephone Encounter (Signed)
[employer] is following up on patients concern of side effects reported 10/21/10 Would like to discuss this with Dr. Lesle Chris   [phone]

## 2012-09-01 NOTE — Telephone Encounter (Signed)
Patient states she is trying to have [employer] reimburse her expenses for medical visits and ER visits, etc. She was thankful we have given information to [facility]. [employer] was advised date of injection, and date of ER Visit. No other personal information was given.

## 2012-09-01 NOTE — Telephone Encounter (Signed)
I sent a note to [employer] letting them know that Michelle [name] had continued to have difficulty with hypersensitivity to fumes and odors. She continues to carry an EpiPen for severe allergic reactions. I was suggest that the speak with Michelle [name] directly regarding the symptoms she continues to have .

## 2012-09-01 NOTE — Telephone Encounter (Signed)
I have called [employer] back and spoken to [facility], they had gotten report from patient about a reaction to her pnuemovac on 10/21/2010. Please see me about this. Alexandre Lightsey

## 2012-09-05 ENCOUNTER — Telehealth: Payer: Self-pay | Admitting: *Deleted

## 2012-09-05 NOTE — Telephone Encounter (Signed)
(  pt aware you are out of office) Pt is due for her annual in November and told to repeat pap in 6 months which would be due in October. Pt asked if she could just have pap done in Nov. Annual appointment? Please advise

## 2012-09-06 NOTE — Telephone Encounter (Signed)
Ok for annual exam end of year to do pap smear.

## 2012-09-06 NOTE — Telephone Encounter (Signed)
Pt informed with the below. 

## 2012-09-18 ENCOUNTER — Other Ambulatory Visit: Payer: Self-pay | Admitting: Gynecology

## 2012-09-27 ENCOUNTER — Ambulatory Visit (INDEPENDENT_AMBULATORY_CARE_PROVIDER_SITE_OTHER): Payer: BC Managed Care – PPO | Admitting: Emergency Medicine

## 2012-09-27 ENCOUNTER — Other Ambulatory Visit: Payer: Self-pay | Admitting: Emergency Medicine

## 2012-09-27 VITALS — BP 125/82 | HR 83 | Temp 97.8°F | Resp 16

## 2012-09-27 DIAGNOSIS — G4700 Insomnia, unspecified: Secondary | ICD-10-CM

## 2012-09-27 DIAGNOSIS — E7800 Pure hypercholesterolemia, unspecified: Secondary | ICD-10-CM

## 2012-09-27 DIAGNOSIS — R5383 Other fatigue: Secondary | ICD-10-CM

## 2012-09-27 DIAGNOSIS — I10 Essential (primary) hypertension: Secondary | ICD-10-CM

## 2012-09-27 DIAGNOSIS — R5381 Other malaise: Secondary | ICD-10-CM

## 2012-09-27 DIAGNOSIS — C73 Malignant neoplasm of thyroid gland: Secondary | ICD-10-CM

## 2012-09-27 DIAGNOSIS — E782 Mixed hyperlipidemia: Secondary | ICD-10-CM

## 2012-09-27 LAB — CBC WITH DIFFERENTIAL/PLATELET
Basophils Absolute: 0 10*3/uL (ref 0.0–0.1)
Basophils Relative: 0 % (ref 0–1)
Eosinophils Absolute: 0.3 10*3/uL (ref 0.0–0.7)
Eosinophils Relative: 2 % (ref 0–5)
HCT: 42.4 % (ref 36.0–46.0)
Lymphocytes Relative: 24 % (ref 12–46)
MCH: 31.6 pg (ref 26.0–34.0)
MCHC: 35.8 g/dL (ref 30.0–36.0)
MCV: 88.1 fL (ref 78.0–100.0)
Monocytes Absolute: 1 10*3/uL (ref 0.1–1.0)
RDW: 13.2 % (ref 11.5–15.5)

## 2012-09-27 MED ORDER — ALPRAZOLAM 1 MG PO TABS: ORAL_TABLET | ORAL | Status: DC

## 2012-09-27 NOTE — Addendum Note (Signed)
Addended by: Eddie Candle on: 09/27/2012 01:04 PM   Modules accepted: Orders

## 2012-09-27 NOTE — Progress Notes (Signed)
  Subjective:    Patient ID: Michelle [name], female    DOB: [birth year], 51 y.o.   MRN: [id number]  HPI problem #1 allergic hypersensitivity. She's had to use her EpiPen one time during the [place] tournament when she was exposed to smoke and perfume. She pretty much is restricted to her house she no longer goes to church because in the balcony area people have started coming up there with perfume on and this has been difficult for her. She stays in her house at night because of the smoke in the air when she tries to walk at night. Problem #2 asthma. Her asthma has been stable she has not had overused her inhalers. Problem #3 hyperlipidemia she continues on her medications and is watching her diet. Problem number 4 her routine GYN care. She is scheduled to see Dr. Audie Box and has not had her mammogram yet this year. Problem number for 5 gastroparesis. She continues to have mild reflux but not severe.    Review of Systems     Objective:   Physical Exam [place] good today she is not in any distress. Her neck is supple. There is no adenopathy noticed in the neck. Her throat is normal without redness or swelling. Her chest is clear to auscultation and percussion. Heart regular rate no murmurs. Abdominal exam reveals some mild right lower abdominal discomfort but no rebound       Assessment & Plan:  Her biggest problem continues to be hypersensitivity to fumes and odors. She continues to have to use her epi at times. All this followed her Pneumovax vaccine 2 years ago. Other medical problems appear to be stable. She's not currently having allergy treatment with the allergist. She is encouraged to have her mammogram done. She is otherwise up to date on her cancer screening. All labs were done today

## 2012-09-28 ENCOUNTER — Other Ambulatory Visit: Payer: Self-pay | Admitting: Radiology

## 2012-09-28 DIAGNOSIS — G4700 Insomnia, unspecified: Secondary | ICD-10-CM

## 2012-09-28 LAB — COMPREHENSIVE METABOLIC PANEL
ALT: 39 U/L — ABNORMAL HIGH (ref 0–35)
AST: 38 U/L — ABNORMAL HIGH (ref 0–37)
Alkaline Phosphatase: 58 U/L (ref 39–117)
Creat: 0.76 mg/dL (ref 0.50–1.10)
Sodium: 141 mEq/L (ref 135–145)
Total Bilirubin: 0.9 mg/dL (ref 0.3–1.2)

## 2012-09-28 LAB — TSH: TSH: 2.228 u[IU]/mL (ref 0.350–4.500)

## 2012-09-28 LAB — LIPID PANEL
HDL: 59 mg/dL (ref >39)
LDL Cholesterol: 95 mg/dL (ref 0–99)

## 2012-09-28 NOTE — Telephone Encounter (Signed)
I called in the Alprazolam for her, #9 with 5 refills, per Dr Cleta Alberts.

## 2012-09-29 LAB — PATHOLOGIST SMEAR REVIEW

## 2012-09-29 MED ORDER — LEVOTHYROXINE SODIUM 100 MCG PO TABS: 100.0000 ug | ORAL_TABLET | Freq: Every day | ORAL | Status: DC

## 2012-09-29 NOTE — Addendum Note (Signed)
Addended by: Johnnette Litter on: 09/29/2012 07:37 PM   Modules accepted: Orders

## 2012-09-30 MED ORDER — EZETIMIBE 10 MG PO TABS: 10.0000 mg | ORAL_TABLET | Freq: Every day | ORAL | Status: DC

## 2012-09-30 MED ORDER — ROSUVASTATIN CALCIUM 20 MG PO TABS: 20.0000 mg | ORAL_TABLET | Freq: Every day | ORAL | Status: DC

## 2012-09-30 NOTE — Addendum Note (Signed)
Addended by: Johnnette Litter on: 09/30/2012 05:07 PM   Modules accepted: Orders

## 2012-10-04 ENCOUNTER — Telehealth: Payer: Self-pay

## 2012-10-04 MED ORDER — PREDNISONE 10 MG PO TABS: 10.0000 mg | ORAL_TABLET | Freq: Every day | ORAL | Status: DC

## 2012-10-04 MED ORDER — FLUTICASONE PROPIONATE HFA 220 MCG/ACT IN AERO: 1.0000 | INHALATION_SPRAY | Freq: Two times a day (BID) | RESPIRATORY_TRACT | Status: DC

## 2012-10-04 NOTE — Telephone Encounter (Signed)
Since she is on Xanax at night she cannot take Tussionex with this at night we could call in some Tessalon Perles 100 mg to take 1 to 2 at night for cough. #30 no refill

## 2012-10-04 NOTE — Telephone Encounter (Signed)
Spoke w/Dr Cleta Alberts who talked w/pt. Sending in Rxs as written by Dr Cleta Alberts. Pt aware.

## 2012-10-04 NOTE — Telephone Encounter (Signed)
PT STATES SHE HAVE A COUGH AND IT'S MAKING HER HARD TO SLEEP AT NIGHT. DR DAUB HAD GIVEN HER SOME COUGH MEDICINE A LONG TIME AGO WHICH HAD CODEINE IN IT SHE THINK IT WAS TUSSIONEX  IN IT. PLEASE CALL [phone]      [facility] AT [phone]

## 2012-10-04 NOTE — Telephone Encounter (Signed)
Gave pt info about cough meds from Dr Cleta Alberts. Pt thanked [place] but stated that the Perles does not help her. She stated that she had some extra prednisone 5 mg and she took two tablets today which is relieving some of the tightness. Pt wanted Dr Cleta Alberts to know this and states that if he doesn't want her taking the pred to call her back and she will not, but it does seem to be helping. Dr Cleta Alberts, please advise.

## 2012-10-04 NOTE — Telephone Encounter (Signed)
Since she is on Xanax at night she cannot take Tussionex with this at night. We could call in some Tessalon Perles and see if that would help her rest at night. The Tessalon Perles can be 100 mg one to 2 at night for cough.

## 2012-10-04 NOTE — Telephone Encounter (Signed)
I called and spoke with patient. She has not been using her Proventil much because it makes her jittery. She took 10 mg of prednisone today it made her feel better if she takes too much prednisone makes her also feel jittery. Treatment will be prednisone 10 mg one a day number 7. We'll also start her on Flovent 220 one puff twice a day followed by a rinse and gargle. She can have refilled for one year on this.

## 2012-10-06 ENCOUNTER — Ambulatory Visit: Payer: BC Managed Care – PPO

## 2012-10-06 ENCOUNTER — Ambulatory Visit (INDEPENDENT_AMBULATORY_CARE_PROVIDER_SITE_OTHER): Payer: BC Managed Care – PPO | Admitting: Emergency Medicine

## 2012-10-06 ENCOUNTER — Telehealth: Payer: Self-pay | Admitting: Radiology

## 2012-10-06 VITALS — BP 130/88 | HR 90 | Temp 98.2°F | Resp 16 | Ht 64.0 in | Wt 132.0 lb

## 2012-10-06 DIAGNOSIS — R05 Cough: Secondary | ICD-10-CM

## 2012-10-06 DIAGNOSIS — D72829 Elevated white blood cell count, unspecified: Secondary | ICD-10-CM

## 2012-10-06 DIAGNOSIS — R0981 Nasal congestion: Secondary | ICD-10-CM

## 2012-10-06 DIAGNOSIS — J3489 Other specified disorders of nose and nasal sinuses: Secondary | ICD-10-CM

## 2012-10-06 DIAGNOSIS — J40 Bronchitis, not specified as acute or chronic: Secondary | ICD-10-CM

## 2012-10-06 LAB — POCT CBC
Granulocyte percent: 71.5 %G (ref 37–80)
MID (cbc): 1.1 — AB (ref 0–0.9)
POC Granulocyte: 14.6 — AB (ref 2–6.9)
POC LYMPH PERCENT: 23.2 %L (ref 10–50)
Platelet Count, POC: 368 10*3/uL (ref 142–424)
RDW, POC: 12.3 %

## 2012-10-06 LAB — POCT SEDIMENTATION RATE: POCT SED RATE: 19 mm/hr (ref 0–22)

## 2012-10-06 MED ORDER — CEPHALEXIN 500 MG PO TABS: 500.0000 mg | ORAL_TABLET | Freq: Three times a day (TID) | ORAL | Status: DC

## 2012-10-06 NOTE — Progress Notes (Signed)
  Subjective:    Patient ID: Michelle [name], female    DOB: 04/24/1961, 51 y.o.   MRN: [id number]  HPI Sore throat beginning approximately 10 days ago with head congestion. Head congestion worsened and turned into chest congestion and wheezing. Productive cough ranging from clear-green, nasal congestion "colored and bloody" No fever. Patient is a history recurrent sinus and bronchitis symptoms. She has a lot of difficulty with change in season. She is very sensitive to fumes and odors.   Review of Systems     Objective:   Physical Exam HEENT exam reveals an alert female who appears ill but not toxic. Her TMs are clear. Her nose is normal. Throat is red. Chest exam reveals rhonchi but no rales. She does not had any wheezing noted but does cough when she tries to take a deep breath. Cardiac exam has a regular rate and rhythm.  Patient on Prednisone  Results for orders placed in visit on 10/06/12  POCT CBC      Component Value Range   WBC 20.4 (*) 4.6 - 10.2 K/uL   Lymph, poc 4.7 (*) 0.6 - 3.4   POC LYMPH PERCENT 23.2  10 - 50 %L   MID (cbc) 1.1 (*) 0 - 0.9   POC MID % 5.3  0 - 12 %M   POC Granulocyte 14.6 (*) 2 - 6.9   Granulocyte percent 71.5  37 - 80 %G   RBC 4.77  4.04 - 5.48 M/uL   Hemoglobin 14.5  12.2 - 16.2 g/dL   HCT, POC 40.9  81.1 - 47.9 %   MCV 95.2  80 - 97 fL   MCH, POC 30.4  27 - 31.2 pg   MCHC 31.9  31.8 - 35.4 g/dL   RDW, POC 91.4     Platelet Count, POC 368  142 - 424 K/uL   MPV 8.1  0 - 99.8 fL   [facility] reading (PRIMARY) by  Dr.Jamirra Curnow chest x-ray reveals no consolidated infiltrate. Waters' view reveals no air-fluid levels        Assessment & Plan:  Patient has a complicated history. When seen in the office last week when she was starting with a sore throat she did have an elevated white count of 16,000. A Pap review was done on the right cannot and revealed mature segmented neutrophils reactive process seems most likely. She is continued with congestion. She  is currently on prednisone 10 mg daily. She is also on a steroid inhaler. Her white count is increased to 20,000 but this is on prednisone. Chest x-ray and Waters' view did not reveal a definite source of infection however her symptoms are most consistent with a sinusitis. She has difficulty with a sensitive stomach so reduce cephalexin 500 twice a day #20 recheck next Tuesday at 104.

## 2012-10-06 NOTE — Telephone Encounter (Signed)
Patient called couple days ago with increased cough. Dr Cleta Alberts wants me to call her and check on her to see if she is improving on the Prednisone. I called her. The cough is not any better she states her cough is productive now. She is going to come in now. Dr Cleta Alberts wants to get a Chest Xray and CBC on her.

## 2012-10-06 NOTE — Patient Instructions (Addendum)

## 2012-10-10 ENCOUNTER — Encounter: Payer: BC Managed Care – PPO | Admitting: Gynecology

## 2012-10-11 ENCOUNTER — Ambulatory Visit (INDEPENDENT_AMBULATORY_CARE_PROVIDER_SITE_OTHER): Payer: BC Managed Care – PPO | Admitting: Emergency Medicine

## 2012-10-11 ENCOUNTER — Ambulatory Visit (INDEPENDENT_AMBULATORY_CARE_PROVIDER_SITE_OTHER): Payer: BC Managed Care – PPO | Admitting: Gynecology

## 2012-10-11 ENCOUNTER — Encounter: Payer: Self-pay | Admitting: Emergency Medicine

## 2012-10-11 ENCOUNTER — Other Ambulatory Visit (HOSPITAL_COMMUNITY)
Admission: RE | Admit: 2012-10-11 | Discharge: 2012-10-11 | Disposition: A | Discharge status: Home / Self Care | Payer: BC Managed Care – PPO | Source: Ambulatory Visit | Attending: Gynecology | Admitting: Gynecology

## 2012-10-11 ENCOUNTER — Telehealth: Payer: Self-pay

## 2012-10-11 ENCOUNTER — Encounter: Payer: Self-pay | Admitting: Gynecology

## 2012-10-11 VITALS — BP 126/84 | Ht 64.0 in | Wt 132.0 lb

## 2012-10-11 VITALS — BP 126/92 | HR 89 | Temp 97.0°F | Resp 16 | Ht 64.0 in | Wt 132.0 lb

## 2012-10-11 DIAGNOSIS — Z01419 Encounter for gynecological examination (general) (routine) without abnormal findings: Secondary | ICD-10-CM

## 2012-10-11 DIAGNOSIS — D72829 Elevated white blood cell count, unspecified: Secondary | ICD-10-CM

## 2012-10-11 DIAGNOSIS — J45909 Unspecified asthma, uncomplicated: Secondary | ICD-10-CM

## 2012-10-11 DIAGNOSIS — J040 Acute laryngitis: Secondary | ICD-10-CM

## 2012-10-11 DIAGNOSIS — J40 Bronchitis, not specified as acute or chronic: Secondary | ICD-10-CM

## 2012-10-11 DIAGNOSIS — N898 Other specified noninflammatory disorders of vagina: Secondary | ICD-10-CM

## 2012-10-11 DIAGNOSIS — J329 Chronic sinusitis, unspecified: Secondary | ICD-10-CM

## 2012-10-11 DIAGNOSIS — R87810 Cervical high risk human papillomavirus (HPV) DNA test positive: Secondary | ICD-10-CM | POA: Insufficient documentation

## 2012-10-11 DIAGNOSIS — R05 Cough: Secondary | ICD-10-CM

## 2012-10-11 DIAGNOSIS — N893 Dysplasia of vagina, unspecified: Secondary | ICD-10-CM

## 2012-10-11 DIAGNOSIS — Z1151 Encounter for screening for human papillomavirus (HPV): Secondary | ICD-10-CM | POA: Insufficient documentation

## 2012-10-11 DIAGNOSIS — J301 Allergic rhinitis due to pollen: Secondary | ICD-10-CM

## 2012-10-11 DIAGNOSIS — Z79890 Hormone replacement therapy (postmenopausal): Secondary | ICD-10-CM

## 2012-10-11 DIAGNOSIS — Z1239 Encounter for other screening for malignant neoplasm of breast: Secondary | ICD-10-CM

## 2012-10-11 LAB — POCT CBC
Hemoglobin: 14.5 g/dL (ref 12.2–16.2)
MCH, POC: 30.4 pg (ref 27–31.2)
MCV: 97.2 fL — AB (ref 80–97)
MPV: 8.1 fL (ref 0–99.8)
POC MID %: 6.7 %M (ref 0–12)
RBC: 4.77 M/uL (ref 4.04–5.48)
WBC: 19.2 10*3/uL — AB (ref 4.6–10.2)

## 2012-10-11 MED ORDER — ESTRADIOL 0.05 MG/24HR TD PTTW: 1.0000 | MEDICATED_PATCH | TRANSDERMAL | Status: DC

## 2012-10-11 MED ORDER — PREDNISONE 10 MG PO TABS: 10.0000 mg | ORAL_TABLET | Freq: Every day | ORAL | Status: DC

## 2012-10-11 MED ORDER — CEPHALEXIN 500 MG PO TABS: 500.0000 mg | ORAL_TABLET | Freq: Three times a day (TID) | ORAL | Status: DC

## 2012-10-11 NOTE — Telephone Encounter (Signed)
Order put in for this. Michelle [name]

## 2012-10-11 NOTE — Progress Notes (Signed)
RHYS ANCHONDO [birth year] [id number]        51 y.o.  G1P1 for annual exam.    Past medical history,surgical history, medications, allergies, family history and social history were all reviewed and documented in the EPIC chart. ROS:  Was performed and pertinent positives and negatives are included in the history.  Exam: Fleet Contras assistant Filed Vitals:   10/11/12 1432  BP: 126/84  Height: 5\' 4"  (1.626 m)  Weight: 132 lb (59.875 kg)   General appearance  Normal Skin grossly normal Head/Neck normal with no cervical or supraclavicular adenopathy thyroid normal Lungs  clear Cardiac RR, without RMG Abdominal  soft, nontender, without masses, organomegaly or hernia Breasts  examined lying and sitting without masses, retractions, discharge or axillary adenopathy.  Bilateral reduction scars noted. Pelvic  Ext/BUS/vagina  normal Pap/HPV of cuff  Adnexa  Without masses or tenderness    Anus and perineum  normal   Rectovaginal  normal sphincter tone without palpated masses or tenderness.    Assessment/Plan:  51 y.o. G1P1 female for annual exam, status post TVH for irregular bleeding 2007.   1. VAIN I.  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 She is continually had low-grade Pap smears since then most recently April 2013. She subsequent underwent re\re colposcopy with biopsy showing VAIN I.  Pap/HPV done today. We'll continue to monitor if low-grade. Otherwise if high-grade triage based on results. 2. ERT. Patient intermittently using Vivelle 0.1 mg patches. Does go some time without some. Options to stop altogether versus continuing reviewed. I again discussed the risks to include increased risk of stroke heart attack DVT. Possible increased risks of breast cancer also reviewed. ACOG/NAMS statements her lowest dose for shortest period of time.  After lengthy discussion about stopping versus continuing  We plan on switching to MiniVivelle 0.05 seeing how she does with this and continuing times  a year. She does want to wean she will go ahead and wean off of these this coming year. 3. Mammography. Patient due now she knows to schedule this and agrees to do so. SBE monthly reviewed. 4. Osteopenia. DEXA 05/2009 with T score -1.9. Recommend repeat now and patient will schedule. 5. Colonoscopy 2011. Patient will follow up recommended interval. 6. Health maintenance. The blood work done today since all done through Dr. Ellis Parents office who sees her routinely.  Follow up and DEXA otherwise annually.    Dara Lords MD, 3:21 PM 10/11/2012

## 2012-10-11 NOTE — Progress Notes (Signed)
  Subjective:    Patient ID: Michelle [name], female    DOB: [birth year], 51 y.o.   MRN: [id number]  HPI patient in to recheck bronchitis. She has severe allergies multiple episodes of severe allergic reactions with anaphylaxis. When seen 3 weeks ago for her general checkup her white count was 16,000, Pap review revealed mature segmented neutrophils. She was seen last week and white count of risen to 20,000 with headache congestion raspy voice and a productive cough. Dear to her multiple drug allergies she was placed on cephalexin 500 twice a day. She's been intolerant to multiple antibiotics as well as having allergic reactions to multiple other antibiotics. She continues to have a cough . She finished prednisone 10 mg a day on Sunday. She continues to have a productive cough. Chest x-ray done last week was normal. Sinus films showed thickening on the right side but no air-fluid levels    Review of Systems     Objective:   Physical Exam TMs are normal. Nose is congested. Examination of the posterior pharynx reveals no tonsillar tissue with no redness or irritation. His nose palpable nodules in the neck. Her chest examination reveals clear breath sounds  Results for orders placed in visit on 10/11/12  POCT CBC      Component Value Range   WBC 19.2 (*) 4.6 - 10.2 K/uL   Lymph, poc 6.9 (*) 0.6 - 3.4   POC LYMPH PERCENT 36.1  10 - 50 %L   MID (cbc) 1.3 (*) 0 - 0.9   POC MID % 6.7  0 - 12 %M   POC Granulocyte 11.0 (*) 2 - 6.9   Granulocyte percent 57.2  37 - 80 %G   RBC 4.77  4.04 - 5.48 M/uL   Hemoglobin 14.5  12.2 - 16.2 g/dL   HCT, POC 40.9  81.1 - 47.9 %   MCV 97.2 (*) 80 - 97 fL   MCH, POC 30.4  27 - 31.2 pg   MCHC 31.3 (*) 31.8 - 35.4 g/dL   RDW, POC 91.4     Platelet Count, POC 372  142 - 424 K/uL   MPV 8.1  0 - 99.8 fL        Assessment & Plan:  Patient here with persistent tracheobronchitis associated with an elevated white count and thickening on sinus films . Her cough  persists. She overall looks better and states she does feel better overall but her symptoms are the same. She was unable to tolerate the flovent we tried for her cough. We'll repeat her white count today. Her white count is still elevated. Referral made to Dr.Wolicki so he can look at her posterior pharynx and laryngeal area. Referral also made to hematology oncology to review her white cell elevation. Previous path review was okay she had been seen previously in 2006 the white cell elevation by Dr. Arline Asp

## 2012-10-11 NOTE — Addendum Note (Signed)
Addended by: Lesle Chris A on: 10/11/2012 12:53 PM   Modules accepted: Orders

## 2012-10-11 NOTE — Patient Instructions (Addendum)
Follow up for pap smear results.  If normal or mild atypia will repeat in one year.  If more significant we will discuss. Follow up for bone density in the office will call you to schedule. Follow up in one year for annual exam.

## 2012-10-11 NOTE — Telephone Encounter (Signed)
Patient forgot to tell Dr Cleta Alberts in her visit with him today that she would like to schedule a mammogram since she has not had one in 2 years. She would prefer [facility] over [facility].  [facility] [phone]

## 2012-10-12 ENCOUNTER — Telehealth: Payer: Self-pay | Admitting: Oncology

## 2012-10-12 ENCOUNTER — Telehealth: Payer: Self-pay | Admitting: Family Medicine

## 2012-10-12 ENCOUNTER — Emergency Department (HOSPITAL_COMMUNITY)
Admission: EM | Admit: 2012-10-12 | Discharge: 2012-10-12 | Disposition: A | Discharge status: Home / Self Care | Payer: BC Managed Care – PPO | Attending: Emergency Medicine | Admitting: Emergency Medicine

## 2012-10-12 ENCOUNTER — Ambulatory Visit (HOSPITAL_COMMUNITY): Admission: RE | Admit: 2012-10-12 | Payer: BC Managed Care – PPO | Source: Ambulatory Visit

## 2012-10-12 ENCOUNTER — Encounter (HOSPITAL_COMMUNITY): Payer: Self-pay | Admitting: Family Medicine

## 2012-10-12 ENCOUNTER — Encounter (HOSPITAL_COMMUNITY): Payer: Self-pay | Admitting: *Deleted

## 2012-10-12 ENCOUNTER — Emergency Department (HOSPITAL_COMMUNITY)
Admission: EM | Admit: 2012-10-12 | Discharge: 2012-10-13 | Disposition: A | Discharge status: Home / Self Care | Payer: BC Managed Care – PPO | Attending: Emergency Medicine | Admitting: Emergency Medicine

## 2012-10-12 ENCOUNTER — Ambulatory Visit (INDEPENDENT_AMBULATORY_CARE_PROVIDER_SITE_OTHER): Payer: BC Managed Care – PPO | Admitting: Emergency Medicine

## 2012-10-12 ENCOUNTER — Emergency Department (HOSPITAL_COMMUNITY): Payer: BC Managed Care – PPO

## 2012-10-12 VITALS — BP 137/95 | HR 97 | Temp 97.5°F | Resp 24 | Ht 64.0 in | Wt 132.0 lb

## 2012-10-12 DIAGNOSIS — Z85850 Personal history of malignant neoplasm of thyroid: Secondary | ICD-10-CM | POA: Insufficient documentation

## 2012-10-12 DIAGNOSIS — E039 Hypothyroidism, unspecified: Secondary | ICD-10-CM | POA: Insufficient documentation

## 2012-10-12 DIAGNOSIS — R109 Unspecified abdominal pain: Secondary | ICD-10-CM

## 2012-10-12 DIAGNOSIS — Z79899 Other long term (current) drug therapy: Secondary | ICD-10-CM | POA: Insufficient documentation

## 2012-10-12 DIAGNOSIS — Z8679 Personal history of other diseases of the circulatory system: Secondary | ICD-10-CM | POA: Insufficient documentation

## 2012-10-12 DIAGNOSIS — R1012 Left upper quadrant pain: Secondary | ICD-10-CM | POA: Insufficient documentation

## 2012-10-12 DIAGNOSIS — F3289 Other specified depressive episodes: Secondary | ICD-10-CM | POA: Insufficient documentation

## 2012-10-12 DIAGNOSIS — Z7982 Long term (current) use of aspirin: Secondary | ICD-10-CM | POA: Insufficient documentation

## 2012-10-12 DIAGNOSIS — F329 Major depressive disorder, single episode, unspecified: Secondary | ICD-10-CM | POA: Insufficient documentation

## 2012-10-12 DIAGNOSIS — M129 Arthropathy, unspecified: Secondary | ICD-10-CM | POA: Insufficient documentation

## 2012-10-12 DIAGNOSIS — E7800 Pure hypercholesterolemia, unspecified: Secondary | ICD-10-CM | POA: Insufficient documentation

## 2012-10-12 DIAGNOSIS — K219 Gastro-esophageal reflux disease without esophagitis: Secondary | ICD-10-CM | POA: Insufficient documentation

## 2012-10-12 DIAGNOSIS — I10 Essential (primary) hypertension: Secondary | ICD-10-CM | POA: Insufficient documentation

## 2012-10-12 DIAGNOSIS — Z87892 Personal history of anaphylaxis: Secondary | ICD-10-CM | POA: Insufficient documentation

## 2012-10-12 DIAGNOSIS — M1990 Unspecified osteoarthritis, unspecified site: Secondary | ICD-10-CM | POA: Insufficient documentation

## 2012-10-12 DIAGNOSIS — R112 Nausea with vomiting, unspecified: Secondary | ICD-10-CM | POA: Insufficient documentation

## 2012-10-12 DIAGNOSIS — Z8669 Personal history of other diseases of the nervous system and sense organs: Secondary | ICD-10-CM | POA: Insufficient documentation

## 2012-10-12 DIAGNOSIS — Z8719 Personal history of other diseases of the digestive system: Secondary | ICD-10-CM | POA: Insufficient documentation

## 2012-10-12 DIAGNOSIS — R1084 Generalized abdominal pain: Secondary | ICD-10-CM

## 2012-10-12 DIAGNOSIS — G40909 Epilepsy, unspecified, not intractable, without status epilepticus: Secondary | ICD-10-CM | POA: Insufficient documentation

## 2012-10-12 DIAGNOSIS — J45909 Unspecified asthma, uncomplicated: Secondary | ICD-10-CM | POA: Insufficient documentation

## 2012-10-12 LAB — POCT UA - MICROSCOPIC ONLY
Casts, Ur, LPF, POC: NEGATIVE
Crystals, Ur, HPF, POC: NEGATIVE
Mucus, UA: NEGATIVE

## 2012-10-12 LAB — CBC
HCT: 39.1 % (ref 36.0–46.0)
MCH: 30.8 pg (ref 26.0–34.0)
MCHC: 34.8 g/dL (ref 30.0–36.0)
MCV: 88.7 fL (ref 78.0–100.0)
RDW: 12.2 % (ref 11.5–15.5)

## 2012-10-12 LAB — POCT URINALYSIS DIPSTICK
Bilirubin, UA: NEGATIVE
Blood, UA: NEGATIVE
Glucose, UA: NEGATIVE
Spec Grav, UA: >=1.03
pH, UA: 5.5

## 2012-10-12 LAB — BASIC METABOLIC PANEL
BUN: 13 mg/dL (ref 6–23)
Calcium: 9.8 mg/dL (ref 8.4–10.5)
Creatinine, Ser: 0.71 mg/dL (ref 0.50–1.10)
GFR calc Af Amer: >90 mL/min (ref >90)
GFR calc non Af Amer: >90 mL/min (ref >90)

## 2012-10-12 LAB — URINALYSIS, ROUTINE W REFLEX MICROSCOPIC
Bilirubin Urine: NEGATIVE
Hgb urine dipstick: NEGATIVE
Protein, ur: NEGATIVE mg/dL
Urobilinogen, UA: 0.2 mg/dL (ref 0.0–1.0)

## 2012-10-12 LAB — PREGNANCY, URINE: Preg Test, Ur: NEGATIVE

## 2012-10-12 LAB — HEPATIC FUNCTION PANEL
Bilirubin, Direct: 0.1 mg/dL (ref 0.0–0.3)
Indirect Bilirubin: 0.5 mg/dL (ref 0.3–0.9)

## 2012-10-12 MED ORDER — METOCLOPRAMIDE HCL 5 MG/ML IJ SOLN
10.0000 mg | Freq: Once | INTRAMUSCULAR | Status: DC
  Filled 2012-10-12: qty 2

## 2012-10-12 MED ORDER — DICYCLOMINE HCL 10 MG/ML IM SOLN
20.0000 mg | Freq: Once | INTRAMUSCULAR | Status: AC
  Administered 2012-10-12: 20 mg via INTRAMUSCULAR
  Filled 2012-10-12: qty 2

## 2012-10-12 MED ORDER — ONDANSETRON HCL 4 MG/2ML IJ SOLN
4.0000 mg | Freq: Once | INTRAMUSCULAR | Status: AC
  Administered 2012-10-12: 4 mg via INTRAVENOUS
  Filled 2012-10-12: qty 2

## 2012-10-12 MED ORDER — SODIUM CHLORIDE 0.9 % IV BOLUS (SEPSIS)
1000.0000 mL | Freq: Once | INTRAVENOUS | Status: AC
  Administered 2012-10-12: 1000 mL via INTRAVENOUS

## 2012-10-12 MED ORDER — HYDROMORPHONE HCL PF 1 MG/ML IJ SOLN
1.0000 mg | Freq: Once | INTRAMUSCULAR | Status: AC
  Administered 2012-10-12: 1 mg via INTRAVENOUS
  Filled 2012-10-12: qty 1

## 2012-10-12 MED ORDER — DIPHENHYDRAMINE HCL 50 MG/ML IJ SOLN
25.0000 mg | Freq: Once | INTRAMUSCULAR | Status: AC
  Administered 2012-10-12: 25 mg via INTRAVENOUS
  Filled 2012-10-12: qty 1

## 2012-10-12 MED ORDER — OXYCODONE-ACETAMINOPHEN 5-325 MG PO TABS: 1.0000 | ORAL_TABLET | Freq: Four times a day (QID) | ORAL | Status: DC | PRN

## 2012-10-12 NOTE — ED Notes (Signed)
Pt c/o left sided flank pain, sts she vomited the pain was so bad, no hx of kidney stones, denies fever, denies blood in urine, pt was seen at urgent care and sent here to r/o kidney stone

## 2012-10-12 NOTE — ED Notes (Signed)
Pt back from CT

## 2012-10-12 NOTE — Progress Notes (Signed)
[facility] [address], [place] [address] [phone]  Date:  10/12/2012   Name:  Michelle [name]   DOB:  [birth year]   MRN:  [id number]  PCP:  [name], MD    Chief Complaint: Abdominal Pain   History of Present Illness:  Michelle [name] is a 51 y.o. very pleasant female patient who presents with the following:  Sudden onset left flank pain radiating into left groin that began 3 hours ago.  Associated with nausea and one episode of vomiting.  No stool change, fever or chills. Pain constant and unrelenting.  No dysuria urgency or frequency.  No GYN or GI symptoms.  No history of kidney stones.  History of vag hyst.  Post menopausal  Patient Active Problem List  Diagnosis  . GERD (gastroesophageal reflux disease)  . Thyroid cancer  . Hypothyroidism  . Hypertension  . Asthma  . Gastroparesis  . Hypercholesteremia  . VAIN (vaginal intraepithelial neoplasia)  . History of thyroid cancer  . Overdose drug  . Depression  . Suicide attempt  . Anaphylaxis    Past Medical History  Diagnosis Date  . Hypercholesteremia   . Reflux   . Gastroparesis   . VAIN I (vaginal intraepithelial neoplasia grade I) 02/2012    Colposcopic biopsies  . GERD (gastroesophageal reflux disease)   . Hypertension   . Osteopenia 05/2009    -1.9  . Hypothyroidism   . Thyroid cancer     papillary  . PONV (postoperative nausea and vomiting)   . Osteoarthritis   . Asthma   . Gastroparesis     history of  . Headache     migraines  . Seizures     1 seizure secondary to wellbutrin-none since  . Allergy   . Anaphylaxis   . Depression     Past Surgical History  Procedure Date  . Cosmetic surgery     buttock liposuction  . Thyroid surgery     for thyroid cancer  . Nissen fundoplication 2004, 2012    REFLUX  . Hernia repair 2004, 2012  . Breast surgery     reduc tion mammoplasty  . Cholecystectomy   . Upper gastrointestinal endoscopy 08/26/2004  . Varicose  vein surgery 03/20/2003    right leg  . Colposcopy 02/2012    VAIN I  . Vaginal hysterectomy 2007    irregular bleeding    History  Substance Use Topics  . Smoking status: Never Smoker   . Smokeless tobacco: Never Used  . Alcohol Use: No    Family History  Problem Relation Age of Onset  . Heart disease Mother   . Hypertension Mother   . Heart disease Father   . Hypertension Father   . Heart disease Sister   . Hypertension Sister   . Diabetes Paternal Grandmother   . Cancer Other   . Cancer Maternal Aunt   . Cancer Maternal Grandmother     Allergies  Allergen Reactions  . Nutritional Supplements Anaphylaxis  . Pneumococcal Vaccines Anaphylaxis  . Tetanus Toxoids Swelling  . Wellbutrin (Bupropion Hcl) Other (See Comments)    seizure  . Bactrim Hives and Other (See Comments)    Severe headache  . Clarithromycin Itching and Other (See Comments)    Severe headache  . Doxycycline Other (See Comments)    Severe GI upset  . Hydone (Chlorthalidone) Other (See Comments)    Vasculitis   . Ranitidine Other (See Comments)  Acts like a diuretic   . Sulfa Antibiotics Hives and Other (See Comments)    Severe headache  . Tetracyclines & Related Other (See Comments)    Severe GI upset    Medication list has been reviewed and updated.  Current Outpatient Prescriptions on File Prior to Visit  Medication Sig Dispense Refill  . albuterol (PROVENTIL,VENTOLIN) 90 MCG/ACT inhaler Inhale 3-6 puffs into the lungs as needed. Shortness of breath      . ALPRAZolam (XANAX) 1 MG tablet One and one half tablet at bedtime this is rx 1 of 10 / Amy / Dr Cleta Alberts  9 tablet  5  . aspirin EC 81 MG tablet Take 81 mg by mouth daily.        . cetirizine (ZYRTEC) 10 MG tablet Take 10 mg by mouth daily.        Marland Kitchen EPINEPHrine (EPIPEN) 0.3 mg/0.3 mL DEVI Inject 0.3 mLs (0.3 mg total) into the muscle as needed.  2 Device  1  . estradiol (MINIVELLE) 0.05 MG/24HR Place 1 patch (0.05 mg total) onto the skin 2  (two) times a week.  8 patch  12  . estradiol (VIVELLE-DOT) 0.1 MG/24HR Place 1 patch (0.1 mg total) onto the skin 2 (two) times a week.  8 patch  11  . ezetimibe (ZETIA) 10 MG tablet Take 1 tablet (10 mg total) by mouth daily.  30 tablet  11  . lansoprazole (PREVACID SOLUTAB) 30 MG disintegrating tablet Take 60-120 mg by mouth 2 (two) times daily as needed. Acid reflux       . levothyroxine (SYNTHROID, LEVOTHROID) 100 MCG tablet Take 1 tablet (100 mcg total) by mouth daily.  30 tablet  11  . lubiprostone (AMITIZA) 24 MCG capsule Take 24 mcg by mouth 2 (two) times daily as needed. Fluid      . metoprolol succinate (TOPROL-XL) 25 MG 24 hr tablet TAKE ONE TABLET BY MOUTH ONE TIME DAILY  30 tablet  1  . ondansetron (ZOFRAN-ODT) 8 MG disintegrating tablet DISSOLVE ONE TABLET IN MOUTH  EVERY SIX TO EIGHT HOURS AS  NEEDED  30 tablet  4  . predniSONE (DELTASONE) 10 MG tablet Take 1 tablet (10 mg total) by mouth daily.  14 tablet  0  . ranitidine (ZANTAC) 50 MG/2ML SOLN Inject 50 mg into the vein once.        . rosuvastatin (CRESTOR) 20 MG tablet Take 1 tablet (20 mg total) by mouth daily.  30 tablet  11  . travoprost, benzalkonium, (TRAVATAN) 0.004 % ophthalmic solution 1 drop at bedtime.        . valACYclovir (VALTREX) 500 MG tablet TAKE ONE TABLET BY MOUTH TWICE DAILY  20 tablet  2  . vitamin B-12 (CYANOCOBALAMIN) 1000 MCG tablet Take 1,000 mcg by mouth daily.        . Cephalexin 500 MG tablet Take 1 tablet (500 mg total) by mouth 3 (three) times daily.  30 tablet  0  . PROAIR HFA 108 (90 BASE) MCG/ACT inhaler INHALE TWO PUFFS BY MOUTH EVERY FOUR HOURS  1 Inhaler  5    Review of Systems:  As per HPI, otherwise negative.    Physical Examination: Filed Vitals:   10/12/12 1526  BP: 137/95  Pulse: 97  Temp: 97.5 F (36.4 C)  Resp: 24   Filed Vitals:   10/12/12 1526  Height: 5\' 4"  (1.626 m)  Weight: 132 lb (59.875 kg)   Body mass index is 22.66 kg/(m^2). Ideal Body Weight: Weight  in (lb)  to have BMI = 25: 145.3   GEN: WDWN, moderately distressed, Non-toxic, A & O x 3 HEENT: Atraumatic, Normocephalic. Neck supple. No masses, No LAD. Ears and Nose: No external deformity. CV: RRR, No M/G/R. No JVD. No thrill. No extra heart sounds. PULM: CTA B, no wheezes, crackles, rhonchi. No retractions. No resp. distress. No accessory muscle use. ABD: S, tender left flank and CVA, ND, +BS. No rebound. No HSM. EXTR: No c/c/e NEURO Normal gait.  PSYCH: Normally interactive. Conversant. Not depressed or anxious appearing.  Calm demeanor.    Assessment and Plan: Ureterolithiasis UA To [facility] for helical CT   Carmelina Dane, MD  Results for orders placed in visit on 10/12/12  POCT URINALYSIS DIPSTICK      Component Value Range   Color, UA yellow     Clarity, UA clear     Glucose, UA neg     Bilirubin, UA neg     Ketones, UA neg     Spec Grav, UA >=1.030     Blood, UA neg     pH, UA 5.5     Protein, UA 30     Urobilinogen, UA 0.2     Nitrite, UA neg     Leukocytes, UA Negative    POCT UA - MICROSCOPIC ONLY      Component Value Range   WBC, Ur, HPF, POC 0-1     RBC, urine, microscopic 0-1     Bacteria, U Microscopic trace     Mucus, UA neg     Epithelial cells, urine per micros 3-5     Crystals, Ur, HPF, POC neg     Casts, Ur, LPF, POC neg     Yeast, UA positive

## 2012-10-12 NOTE — Telephone Encounter (Signed)
Called patient regarding her CT results.  No sign of nephrolith or other acute findings. She was checked in through the ER and is under care of physician there, has received dilaudid for pain. She is waiting for provider there to instruct further.  Advised her that myself and Dr. Dareen Piano are here at 8 tomorrow morning and Dr. Cleta Alberts at 10 if plan for follow up is needed. Understanding expressed.

## 2012-10-12 NOTE — ED Provider Notes (Signed)
History     CSN: [id number]  Arrival date & time 10/12/12  1712   First MD Initiated Contact with Patient 10/12/12 1802      Chief Complaint  Patient presents with  . Flank Pain    (Consider location/radiation/quality/duration/timing/severity/associated sxs/prior treatment) HPI Pt presents with c/o pain in left flank which radiates to left upper abdomen.  Pain started today and has been intermittent but was worsening which prompted her to go see her MD.  No fever, no vomiting or nausea.  Pt states this feels different than her prior pain due to gastroparesis.  She was referred to the ED for CT scan to evaluate for ureteral stone.  Pain described as sharp. There are no other associated systemic symptoms, there are no other alleviating or modifying factors.  Per chart review patient has also had elevated WBC- this has been elevated around 20K- she has been referred to hematology for this and has appointment next week- she also notes that she has had similar problems in the past several years ago.   Past Medical History  Diagnosis Date  . Hypercholesteremia   . Reflux   . Gastroparesis   . VAIN I (vaginal intraepithelial neoplasia grade I) 02/2012    Colposcopic biopsies  . GERD (gastroesophageal reflux disease)   . Hypertension   . Osteopenia 05/2009    -1.9  . Hypothyroidism   . Thyroid cancer     papillary  . PONV (postoperative nausea and vomiting)   . Osteoarthritis   . Asthma   . Gastroparesis     history of  . Headache     migraines  . Seizures     1 seizure secondary to wellbutrin-none since  . Allergy   . Anaphylaxis   . Depression     Past Surgical History  Procedure Date  . Cosmetic surgery     buttock liposuction  . Thyroid surgery     for thyroid cancer  . Nissen fundoplication 2004, 2012    REFLUX  . Hernia repair 2004, 2012  . Breast surgery     reduc tion mammoplasty  . Cholecystectomy   . Upper gastrointestinal endoscopy 08/26/2004  . Varicose  vein surgery 03/20/2003    right leg  . Colposcopy 02/2012    VAIN I  . Vaginal hysterectomy 2007    irregular bleeding    Family History  Problem Relation Age of Onset  . Heart disease Mother   . Hypertension Mother   . Heart disease Father   . Hypertension Father   . Heart disease Sister   . Hypertension Sister   . Diabetes Paternal Grandmother   . Cancer Other   . Cancer Maternal Aunt   . Cancer Maternal Grandmother     History  Substance Use Topics  . Smoking status: Never Smoker   . Smokeless tobacco: Never Used  . Alcohol Use: No    OB History    Grav Para Term Preterm Abortions TAB SAB Ect Mult Living   1 1        1       Review of Systems ROS reviewed and all otherwise negative except for mentioned in HPI  Allergies  Nutritional supplements; Pneumococcal vaccines; Tetanus toxoids; Wellbutrin; Bactrim; Clarithromycin; Doxycycline; Hydone; Ranitidine; Sulfa antibiotics; and Tetracyclines & related  Home Medications   Current Outpatient Rx  Name  Route  Sig  Dispense  Refill  . ALBUTEROL SULFATE HFA 108 (90 BASE) MCG/ACT IN AERS               .  ALBUTEROL 90 MCG/ACT IN AERS   Inhalation   Inhale 3-6 puffs into the lungs as needed. Shortness of breath         . ALPRAZOLAM 1 MG PO TABS      One and one half tablet at bedtime this is rx 1 of 10 / Amy / Dr Cleta Alberts         . ASPIRIN EC 81 MG PO TBEC   Oral   Take 81 mg by mouth daily.           . CEPHALEXIN 500 MG PO TABS   Oral   Take 500 mg by mouth 3 (three) times daily.         Marland Kitchen CETIRIZINE HCL 10 MG PO TABS   Oral   Take 10 mg by mouth daily.           Marland Kitchen EPINEPHRINE 0.3 MG/0.3ML IJ DEVI   Intramuscular   Inject 0.3 mg into the muscle as needed.         Marland Kitchen ESTRADIOL 0.05 MG/24HR TD PTTW   Transdermal   Place 1 patch onto the skin 2 (two) times a week.         Marland Kitchen ESTRADIOL 0.1 MG/24HR TD PTTW   Transdermal   Place 1 patch (0.1 mg total) onto the skin 2 (two) times a week.   8  patch   11     Pt needs to make appointment.   Marland Kitchen EZETIMIBE 10 MG PO TABS   Oral   Take 10 mg by mouth daily.         Marland Kitchen LEVOTHYROXINE SODIUM 100 MCG PO TABS   Oral   Take 100 mcg by mouth daily.         . LUBIPROSTONE 24 MCG PO CAPS   Oral   Take 24 mcg by mouth 2 (two) times daily as needed. Fluid         . METOPROLOL SUCCINATE ER 25 MG PO TB24   Oral   Take 25 mg by mouth daily.         Marland Kitchen ONDANSETRON 8 MG PO TBDP   Oral   Take 8 mg by mouth every 8 (eight) hours as needed. Nausea         . PREDNISONE 10 MG PO TABS   Oral   Take 10 mg by mouth daily.         Marland Kitchen ROSUVASTATIN CALCIUM 20 MG PO TABS   Oral   Take 20 mg by mouth daily.         . TRAVOPROST 0.004 % OP SOLN      1 drop at bedtime.           Marland Kitchen VALACYCLOVIR HCL 500 MG PO TABS   Oral   Take 500 mg by mouth 2 (two) times daily.         Marland Kitchen VITAMIN B-12 1000 MCG PO TABS   Oral   Take 1,000 mcg by mouth daily.           . OXYCODONE-ACETAMINOPHEN 5-325 MG PO TABS   Oral   Take 1-2 tablets by mouth every 6 (six) hours as needed for pain.   15 tablet   0     BP 136/94  Pulse 77  Temp 98.3 F (36.8 C) (Oral)  Resp 16  SpO2 99%  LMP 05/23/2006 Vitals reviewed Physical Exam Physical Examination: General appearance - alert, well appearing, and in no distress Mental status - alert,  oriented to person, place, and time Eyes - no conjunctival injection, no scleral icterus Mouth - mucous membranes moist, pharynx normal without lesions Chest - clear to auscultation, no wheezes, rales or rhonchi, symmetric air entry Heart - normal rate, regular rhythm, normal S1, S2, no murmurs, rubs, clicks or gallops Abdomen - soft, nontender, nondistended, no masses or organomegaly, nabs Extremities - peripheral pulses normal, no pedal edema, no clubbing or cyanosis Skin - normal coloration and turgor, no rashes Back- no midline tenderness, mild left CVA tenderness  ED Course  Procedures (including  critical care time)  9:14 PM  D/w MD from [facility], he has spoken with patient also and will be able to see her tomorrow morning for followup.   Labs Reviewed  URINALYSIS, ROUTINE W REFLEX MICROSCOPIC - Abnormal; Notable for the following:    APPearance CLOUDY (*)     Ketones, ur TRACE (*)     All other components within normal limits  CBC - Abnormal; Notable for the following:    WBC 22.3 (*)     All other components within normal limits  PREGNANCY, URINE  BASIC METABOLIC PANEL  HEPATIC FUNCTION PANEL  LIPASE, BLOOD   Ct Abdomen Pelvis Wo Contrast  10/12/2012  *RADIOLOGY REPORT*  Clinical Data: Left flank pain  CT ABDOMEN AND PELVIS WITHOUT CONTRAST  Technique:  Multidetector CT imaging of the abdomen and pelvis was performed following the standard protocol without intravenous contrast.  Comparison: None.  Findings: Postoperative changes at the gastroesophageal junction.  Post cholecystectomy.  Liver, spleen, pancreas, adrenal glands are within normal limits.  No hydronephrosis.  No urinary calculus.  Uterus is absent.  Adnexa are within normal limits.  No evidence of sigmoid diverticulitis.  Bilateral buttock implants are stable compared with a prior MRI.  No acute bony deformity.  Mild facet arthropathy in the lower lumbar spine.  IMPRESSION: No evidence of urinary calculus.  Postoperative changes are noted.   Original Report Authenticated By: Jolaine Click, M.D.      1. Flank pain       MDM  Pt presents with c/o left flank pain- was sent to [facility] by PMD for CT scan to eval for stones.  Pt checked into the ED.  Pt treated with pain and nausea meds, labs reassuring- however she does have elevated WBC- she has been referred to hematology about this and has appointment for next week.  Urine reassuring.  CT scan of abdomen without acute abnormatity.  Discharged with strict return precautions.  Pt agreeable with plan.        Ethelda Chick, MD 10/12/12 2156

## 2012-10-12 NOTE — ED Notes (Signed)
Patient was seen here earlier; discharged around 9:30pm. States she has been vomiting since discharge. Took Zofran without relief. Now states that abdominal pain is now returning. Called Dr. Cleta Alberts and he instructed patient to return here due to her elevated white count and persistent vomiting.

## 2012-10-12 NOTE — Telephone Encounter (Signed)
C/D 11/20/123 for appt.10/18/12

## 2012-10-13 ENCOUNTER — Telehealth: Payer: Self-pay | Admitting: Emergency Medicine

## 2012-10-13 MED ORDER — SODIUM CHLORIDE 0.9 % IV SOLN
Freq: Once | INTRAVENOUS | Status: AC
  Administered 2012-10-13: via INTRAVENOUS

## 2012-10-13 MED ORDER — PROMETHAZINE HCL 25 MG/ML IJ SOLN
12.5000 mg | Freq: Once | INTRAMUSCULAR | Status: AC
  Administered 2012-10-13: 12.5 mg via INTRAVENOUS
  Filled 2012-10-13: qty 1

## 2012-10-13 MED ORDER — DICYCLOMINE HCL 20 MG PO TABS: 20.0000 mg | ORAL_TABLET | Freq: Four times a day (QID) | ORAL | Status: DC | PRN

## 2012-10-13 NOTE — Telephone Encounter (Signed)
I spoke to Michelle [name], she is feeling better, she sounded good. I advised her of the appt with Dr Elnoria Howard, she said this is fine. She is advised you are here this weekend, and I am too. She has assured me she will call me or you if she gets worse. Michelle [name]

## 2012-10-13 NOTE — Telephone Encounter (Signed)
I discussed case with Dr. Arline Asp. He is scheduled to see the patient on Tuesday. I spoke with our clinical team leader and she will call and check on his driver's for a status report. I had actually just spoken with Britta Mccreedy and she updated me at this time on the patient's status.

## 2012-10-13 NOTE — Telephone Encounter (Signed)
Patient was in ER last pm, I am going to wait and call her back later, to ck on her.

## 2012-10-13 NOTE — ED Provider Notes (Signed)
History     CSN: [id number]  Arrival date & time 10/12/12  2302   First MD Initiated Contact with Patient 10/12/12 2309      Chief Complaint  Patient presents with  . Emesis  . Abdominal Pain    (Consider location/radiation/quality/duration/timing/severity/associated sxs/prior treatment) HPI 51 year old female seen in the emergency department a few hours ago due to abdominal pain with nausea and elevated white count. Patient had unremarkable workup and then was to followup with her primary care doctor as well as hematology given her unexplained leukocytosis. Patient reports since discharge from the hospital earlier at 9:30, she has had profuse vomiting and return of pain. Patient with history of gastroparesis after second lap Nissen. She reports this does not feel like her gastroparesis. Pain is diffuse mainly on the left side  Past Medical History  Diagnosis Date  . Hypercholesteremia   . Reflux   . Gastroparesis   . VAIN I (vaginal intraepithelial neoplasia grade I) 02/2012    Colposcopic biopsies  . GERD (gastroesophageal reflux disease)   . Hypertension   . Osteopenia 05/2009    -1.9  . Hypothyroidism   . Thyroid cancer     papillary  . PONV (postoperative nausea and vomiting)   . Osteoarthritis   . Asthma   . Gastroparesis     history of  . Headache     migraines  . Seizures     1 seizure secondary to wellbutrin-none since  . Allergy   . Anaphylaxis   . Depression     Past Surgical History  Procedure Date  . Cosmetic surgery     buttock liposuction  . Thyroid surgery     for thyroid cancer  . Nissen fundoplication 2004, 2012    REFLUX  . Hernia repair 2004, 2012  . Breast surgery     reduc tion mammoplasty  . Cholecystectomy   . Upper gastrointestinal endoscopy 08/26/2004  . Varicose vein surgery 03/20/2003    right leg  . Colposcopy 02/2012    VAIN I  . Vaginal hysterectomy 2007    irregular bleeding    Family History  Problem Relation Age of  Onset  . Heart disease Mother   . Hypertension Mother   . Heart disease Father   . Hypertension Father   . Heart disease Sister   . Hypertension Sister   . Diabetes Paternal Grandmother   . Cancer Other   . Cancer Maternal Aunt   . Cancer Maternal Grandmother     History  Substance Use Topics  . Smoking status: Never Smoker   . Smokeless tobacco: Never Used  . Alcohol Use: No    OB History    Grav Para Term Preterm Abortions TAB SAB Ect Mult Living   1 1        1       Review of Systems  See History of Present Illness; otherwise all other systems are reviewed and negative  Allergies  Nutritional supplements; Pneumococcal vaccines; Tetanus toxoids; Wellbutrin; Bactrim; Clarithromycin; Doxycycline; Hydone; Ranitidine; Reglan; Sulfa antibiotics; and Tetracyclines & related  Home Medications   Current Outpatient Rx  Name  Route  Sig  Dispense  Refill  . ALBUTEROL 90 MCG/ACT IN AERS   Inhalation   Inhale 3-6 puffs into the lungs as needed. Shortness of breath         . ALPRAZOLAM 1 MG PO TABS   Oral   Take 1 mg by mouth at bedtime. scheduled         .  ASPIRIN EC 81 MG PO TBEC   Oral   Take 81 mg by mouth daily.           . CEPHALEXIN 500 MG PO TABS   Oral   Take 500 mg by mouth 3 (three) times daily.         Marland Kitchen CETIRIZINE HCL 10 MG PO TABS   Oral   Take 10 mg by mouth at bedtime.          Marland Kitchen ESTRADIOL 0.05 MG/24HR TD PTTW   Transdermal   Place 1 patch onto the skin 2 (two) times a week.         Marland Kitchen EZETIMIBE 10 MG PO TABS   Oral   Take 10 mg by mouth daily.         Marland Kitchen LEVOTHYROXINE SODIUM 100 MCG PO TABS   Oral   Take 100 mcg by mouth daily.         . LUBIPROSTONE 24 MCG PO CAPS   Oral   Take 24 mcg by mouth 2 (two) times daily as needed. Fluid         . METOPROLOL SUCCINATE ER 25 MG PO TB24   Oral   Take 25 mg by mouth daily.         Marland Kitchen ONDANSETRON 8 MG PO TBDP   Oral   Take 8 mg by mouth every 8 (eight) hours as needed. Nausea          . PREDNISONE 10 MG PO TABS   Oral   Take 10 mg by mouth daily.         Marland Kitchen ROSUVASTATIN CALCIUM 20 MG PO TABS   Oral   Take 20 mg by mouth daily.         . TRAVOPROST 0.004 % OP SOLN   Both Eyes   Place 1 drop into both eyes at bedtime.          Marland Kitchen VALACYCLOVIR HCL 500 MG PO TABS   Oral   Take 500 mg by mouth 2 (two) times daily as needed. For fever blisters         . VITAMIN B-12 1000 MCG PO TABS   Oral   Take 1,000 mcg by mouth daily.           Marland Kitchen DICYCLOMINE HCL 20 MG PO TABS   Oral   Take 1 tablet (20 mg total) by mouth every 6 (six) hours as needed.   20 tablet   0   . EPINEPHRINE 0.3 MG/0.3ML IJ DEVI   Intramuscular   Inject 0.3 mg into the muscle as needed. For allergic reactions         . OXYCODONE-ACETAMINOPHEN 5-325 MG PO TABS   Oral   Take 1-2 tablets by mouth every 6 (six) hours as needed for pain.   15 tablet   0     BP 135/87  Pulse 73  Temp 97.6 F (36.4 C) (Oral)  Resp 18  SpO2 99%  LMP 05/23/2006  Physical Exam  Nursing note and vitals reviewed. Constitutional: She is oriented to person, place, and time. She appears well-developed and well-nourished.  HENT:  Head: Normocephalic and atraumatic.  Nose: Nose normal.  Mouth/Throat: Oropharynx is clear and moist.  Eyes: Conjunctivae normal and EOM are normal. Pupils are equal, round, and reactive to light.  Neck: Normal range of motion. Neck supple. No JVD present. No tracheal deviation present. No thyromegaly present.  Cardiovascular: Normal rate, regular rhythm, normal heart  sounds and intact distal pulses.  Exam reveals no gallop and no friction rub.   No murmur heard. Pulmonary/Chest: Effort normal and breath sounds normal. No stridor. No respiratory distress. She has no wheezes. She has no rales. She exhibits no tenderness.  Abdominal: Soft. Bowel sounds are normal. She exhibits no distension and no mass. There is tenderness (diffuse tenderness without rebound or guarding).  There is no rebound and no guarding.  Musculoskeletal: Normal range of motion. She exhibits no edema and no tenderness.  Lymphadenopathy:    She has no cervical adenopathy.  Neurological: She is alert and oriented to person, place, and time. She exhibits normal muscle tone. Coordination normal.  Skin: Skin is warm and dry. No rash noted. No erythema. No pallor.  Psychiatric: She has a normal mood and affect. Her behavior is normal. Judgment and thought content normal.    ED Course  Procedures (including critical care time)  Results for orders placed during the hospital encounter of 10/12/12  URINALYSIS, ROUTINE W REFLEX MICROSCOPIC      Component Value Range   Color, Urine YELLOW  YELLOW   APPearance CLOUDY (*) CLEAR   Specific Gravity, Urine 1.026  1.005 - 1.030   pH 5.0  5.0 - 8.0   Glucose, UA NEGATIVE  NEGATIVE mg/dL   Hgb urine dipstick NEGATIVE  NEGATIVE   Bilirubin Urine NEGATIVE  NEGATIVE   Ketones, ur TRACE (*) NEGATIVE mg/dL   Protein, ur NEGATIVE  NEGATIVE mg/dL   Urobilinogen, UA 0.2  0.0 - 1.0 mg/dL   Nitrite NEGATIVE  NEGATIVE   Leukocytes, UA NEGATIVE  NEGATIVE  PREGNANCY, URINE      Component Value Range   Preg Test, Ur NEGATIVE  NEGATIVE  CBC      Component Value Range   WBC 22.3 (*) 4.0 - 10.5 K/uL   RBC 4.41  3.87 - 5.11 MIL/uL   Hemoglobin 13.6  12.0 - 15.0 g/dL   HCT 16.1  09.6 - 04.5 %   MCV 88.7  78.0 - 100.0 fL   MCH 30.8  26.0 - 34.0 pg   MCHC 34.8  30.0 - 36.0 g/dL   RDW 40.9  81.1 - 91.4 %   Platelets 305  150 - 400 K/uL  BASIC METABOLIC PANEL      Component Value Range   Sodium 138  135 - 145 mEq/L   Potassium 3.7  3.5 - 5.1 mEq/L   Chloride 98  96 - 112 mEq/L   CO2 27  19 - 32 mEq/L   Glucose, Bld 99  70 - 99 mg/dL   BUN 13  6 - 23 mg/dL   Creatinine, Ser 7.82  0.50 - 1.10 mg/dL   Calcium 9.8  8.4 - 95.6 mg/dL   GFR calc non Af Amer >90  >90 mL/min   GFR calc Af Amer >90  >90 mL/min  HEPATIC FUNCTION PANEL      Component Value Range     Total Protein 7.3  6.0 - 8.3 g/dL   Albumin 4.2  3.5 - 5.2 g/dL   AST 24  0 - 37 U/L   ALT 30  0 - 35 U/L   Alkaline Phosphatase 63  39 - 117 U/L   Total Bilirubin 0.6  0.3 - 1.2 mg/dL   Bilirubin, Direct 0.1  0.0 - 0.3 mg/dL   Indirect Bilirubin 0.5  0.3 - 0.9 mg/dL  LIPASE, BLOOD      Component Value Range   Lipase 33  11 - 59 U/L   Ct Abdomen Pelvis Wo Contrast  10/12/2012  *RADIOLOGY REPORT*  Clinical Data: Left flank pain  CT ABDOMEN AND PELVIS WITHOUT CONTRAST  Technique:  Multidetector CT imaging of the abdomen and pelvis was performed following the standard protocol without intravenous contrast.  Comparison: None.  Findings: Postoperative changes at the gastroesophageal junction.  Post cholecystectomy.  Liver, spleen, pancreas, adrenal glands are within normal limits.  No hydronephrosis.  No urinary calculus.  Uterus is absent.  Adnexa are within normal limits.  No evidence of sigmoid diverticulitis.  Bilateral buttock implants are stable compared with a prior MRI.  No acute bony deformity.  Mild facet arthropathy in the lower lumbar spine.  IMPRESSION: No evidence of urinary calculus.  Postoperative changes are noted.   Original Report Authenticated By: Jolaine Click, M.D.       1. Nausea and vomiting   2. Abdominal pain, diffuse       MDM  51 year old female with nausea and vomiting after recent workup. Patient given fluids, Bentyl, Phenergan. She's had resolution of her nausea and vomiting and will followup with her primary care Dr.        Olivia Mackie, MD 10/13/12 [phone]

## 2012-10-13 NOTE — Telephone Encounter (Signed)
Left message for her to call me back. 

## 2012-10-13 NOTE — Telephone Encounter (Signed)
appt with Dr Arline Asp is Nov 26th. Called Dr Elnoria Howard, she has seen him before. He is out of office until Monday he can see her Monday at 10:15.

## 2012-10-13 NOTE — Telephone Encounter (Signed)
I checked on Michelle [name] per Dr Ellis Parents request and Michelle [name] stated she is still doing much better. She reports she just feels very tired out, but not real pain - just twinges when she uses the bathroom. I notified Michelle [name] that Dr Cleta Alberts updated Dr Arline Asp on her white count and advised her that if her severe pain returns she needs to got to the ER because she will need to be admitted. Michelle [name] agreed.

## 2012-10-13 NOTE — Telephone Encounter (Signed)
After about 10 please call patient and check on status . Advised her to discontinue her prednisone and cephalexin . Please see if Dr. Elnoria Howard has any openings were he could evaluate her for her flank and abdominal pain. Please check and be sure the appointment is being made with Dr. Arline Asp at the cancer center to evaluate her for her elevated white cell count. Ask Lupita Leash if she can expedite her appointment. If she is worsening please let me know and we can decide on a different treatment plan.

## 2012-10-14 ENCOUNTER — Telehealth: Payer: Self-pay

## 2012-10-14 ENCOUNTER — Encounter: Payer: Self-pay | Admitting: Gynecology

## 2012-10-14 NOTE — Telephone Encounter (Signed)
Pt had called back to report that she is having almost no abdominal pain. She has had some diarrhea, but that is not unusual for her. She reports no fever. Advised her to present to ER if pain returns. Pt agreed.

## 2012-10-14 NOTE — Telephone Encounter (Signed)
LMOM to CB. Check on her status for Dr Cleta Alberts and forward message to him. Ask pt specifically about abd pain, fever and diarrhea.

## 2012-10-16 ENCOUNTER — Other Ambulatory Visit: Payer: Self-pay | Admitting: Physician Assistant

## 2012-10-17 ENCOUNTER — Other Ambulatory Visit: Payer: Self-pay | Admitting: Emergency Medicine

## 2012-10-18 ENCOUNTER — Other Ambulatory Visit (HOSPITAL_BASED_OUTPATIENT_CLINIC_OR_DEPARTMENT_OTHER): Payer: BC Managed Care – PPO | Admitting: Lab

## 2012-10-18 ENCOUNTER — Encounter: Payer: Self-pay | Admitting: Oncology

## 2012-10-18 ENCOUNTER — Ambulatory Visit (HOSPITAL_BASED_OUTPATIENT_CLINIC_OR_DEPARTMENT_OTHER): Payer: BC Managed Care – PPO

## 2012-10-18 ENCOUNTER — Ambulatory Visit (HOSPITAL_BASED_OUTPATIENT_CLINIC_OR_DEPARTMENT_OTHER): Payer: BC Managed Care – PPO | Admitting: Oncology

## 2012-10-18 ENCOUNTER — Telehealth: Payer: Self-pay

## 2012-10-18 VITALS — BP 118/80 | HR 86 | Temp 98.5°F | Resp 20 | Ht 64.0 in | Wt 148.1 lb

## 2012-10-18 DIAGNOSIS — D72829 Elevated white blood cell count, unspecified: Secondary | ICD-10-CM

## 2012-10-18 LAB — CBC WITH DIFFERENTIAL/PLATELET
BASO%: 0.2 % (ref 0.0–2.0)
Eosinophils Absolute: 0.4 10*3/uL (ref 0.0–0.5)
MCHC: 33.9 g/dL (ref 31.5–36.0)
MCV: 92.6 fL (ref 79.5–101.0)
MONO#: 0.8 10*3/uL (ref 0.1–0.9)
MONO%: 6.3 % (ref 0.0–14.0)
NEUT#: 6.9 10*3/uL — ABNORMAL HIGH (ref 1.5–6.5)
RBC: 4.51 10*6/uL (ref 3.70–5.45)
RDW: 12.7 % (ref 11.2–14.5)
WBC: 13 10*3/uL — ABNORMAL HIGH (ref 3.9–10.3)

## 2012-10-18 LAB — MORPHOLOGY: PLT EST: ADEQUATE

## 2012-10-18 LAB — COMPREHENSIVE METABOLIC PANEL (CC13)
AST: 34 U/L (ref 5–34)
Albumin: 3.8 g/dL (ref 3.5–5.0)
BUN: 7 mg/dL (ref 7.0–26.0)
CO2: 28 mEq/L (ref 22–29)
Calcium: 9.2 mg/dL (ref 8.4–10.4)
Chloride: 106 mEq/L (ref 98–107)
Creatinine: 0.8 mg/dL (ref 0.6–1.1)
Glucose: 109 mg/dl — ABNORMAL HIGH (ref 70–99)

## 2012-10-18 LAB — LACTATE DEHYDROGENASE (CC13): LDH: 193 U/L (ref 125–245)

## 2012-10-18 MED ORDER — PROMETHAZINE HCL 25 MG PO TABS: 25.0000 mg | ORAL_TABLET | Freq: Three times a day (TID) | ORAL | Status: DC | PRN

## 2012-10-18 NOTE — Telephone Encounter (Signed)
LMOM to CB on both numbers 

## 2012-10-18 NOTE — Telephone Encounter (Signed)
Pt returned call and wbc count is down to 13.6.  She is at pharmacy now picking up rx for phenergan now

## 2012-10-18 NOTE — Telephone Encounter (Signed)
Dr Cleta Alberts, do you want to Rx Phenergan for pt?

## 2012-10-18 NOTE — Telephone Encounter (Signed)
PT WOULD LIKE TO KNOW IF DR DAUB OR HIS NURSE WOULD CALL HER IN SOME PHENERGAN. PLEASE CALL [phone] OR HER CELL AT [phone]    [facility] AT [phone]

## 2012-10-18 NOTE — Telephone Encounter (Signed)
Called [facility] and they reported that the pt normally gets Phenergan tablets, she only got the suppositories once. Sending in Rx for the tablets as written by Dr Cleta Alberts. Will try to reach pt to notify her after 4:30 as she is seeing Dr Arline Asp at this time.

## 2012-10-18 NOTE — Progress Notes (Signed)
This office note has been dictated.  #[id number]

## 2012-10-18 NOTE — Progress Notes (Signed)
Checked in new patient. No financial issues. °

## 2012-10-18 NOTE — Telephone Encounter (Signed)
You can call in Phenergan 25 mg either the tablets or suppositories which ever she prefers. I would only give her 6. She is scheduled to see her GI specialist Dr. Elnoria Howard tomorrow. She is currently being seen by Dr. Arline Asp her oncologist for her high white count.

## 2012-10-21 ENCOUNTER — Encounter: Payer: Self-pay | Admitting: Gynecology

## 2012-10-24 NOTE — Progress Notes (Signed)
CC:   Michelle [name]. Michelle [name], M.D.  PROBLEM LIST: 1. Leukocytosis dating back to February 2004 with bone marrow from     01/20/2005 that showed nonspecific findings, except for iron     deficiency.  BCR/ABL was negative back in January 2006.  Michelle     patient has had variable white counts in association with normal     hemoglobin, hematocrit, and platelet count.  White count has been     as high as 22.3 on 10/12/2012; however, in April 2013 white count     was 8.8.  Sedimentation rate has been normal at a time when Michelle     white count has been slightly elevated.  Differential has been     normal.  Michelle patient has not had a splenectomy, has a normal spleen     on CT imaging most recently carried out on 10/12/2012.  She has     been on prednisone 10 mg daily and had an upper respiratory     infection at Michelle time that her white count was 22.3.  Michelle patient     had been seen by me as a new patient on 12/19/2004.  I had seen Michelle     patient in July 2006 and concluded that she had a leukocytosis of     uncertain etiology, but I felt it was clinically benign.  CBC on     10/18/2012 is notable for white count of 13.0 with a normal     differential and essentially normal smear.  Hemoglobin is 14.2,     hematocrit 41.8, and platelets 262,000.  Michelle JAK2 mutation is     pending. 2. Dyslipidemia. 3. Gastroesophageal reflux disease. 4. Hypertension. 5. Asthma dating back to 2000. 6. History of gastric polyps. 7. History of papillary thyroid carcinoma, status post surgery in     April 2004. 8. Status post laparoscopic cholecystectomy in November 2005. 9. Status post Nissen fundoplication in November 2005 with a redo     surgery by Dr. Glenna [name] on 08/20/2011. 10.History of bilateral breast reduction in 1999. 11.History of vaginal hysterectomy on 06/07/2006. 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 13.History of Xanax overdose in December  2012 for which Michelle patient     was hospitalized. 14.History of glaucoma.  MEDICATIONS: 1. Albuterol inhaler 3-6 puffs as needed. 2. Xanax 1 mg at bedtime as needed. 3. Aspirin EC 81 mg daily. 4. Carafate 1 g/10 mL 2 teaspoons 4 times daily. 5. Zyrtec 10 mg at bedtime. 6. EpiPen inject 0.3 mg IM as needed for allergic reaction. 7. Estradiol 0.05 mg/24-hour patch apply twice weekly. 8. Zetia 10 mg daily. 9. Levothyroxine 100 mcg daily. 10.Metoprolol 25 mg daily. 11.Zofran ODT 8 mg every 8 hours as needed for nausea. 12.Oxycodone-acetaminophen 5-325 one to 2 tablets every 6 hours as     needed for pain. 13.Crestor 20 mg daily. 14.Travatan 0.004% ophthalmic solution 1 drop into both eyes at     bedtime. 15.Valtrex 500 mg twice daily as needed for fever blisters. 16.Vitamin B12 1000 mcg daily.  SMOKING HISTORY:  Michelle patient has never smoked cigarettes.  HISTORY:  Michelle [name] is a 51 year old white married female whom I am asked to see in consultation by Michelle [name] for reevaluation of leukocytosis which dates back at least to February 2004,  It will be recalled that I had first seen this patient on consultation from Dr. Cleta [name]  on December 19, 2004.  Workup at that time was unrevealing.  We had seen Michelle patient for follow up in July 2007 at which time her white count was 11.2 with an otherwise normal CBC.  On 06/18/2005 white count was 9.0.  Michelle patient had told me that she thinks that she was first told about an elevated white count when she was living in [place], perhaps as long as 2001.  I do not think we ever received records.  Michelle [name] did undergo a bone marrow that was unremarkable with Michelle exception of absent iron stores.  Michelle bone marrow was done on 01/20/2005.  She also had a negative BCR/ABL back in January 2006.  My conclusion at that time was that we were most likely dealing with a benign reactive leukocytosis, but Michelle exact etiology was unclear.  I think contributing  factors may be use of corticosteroids even low-dose in conjunction with infection or inflammatory episodes.  As stated, Michelle patient was last seen by [place] on 06/17/2006.  Since then, she apparently has done fairly well.  She has developed some additional problems, specifically some anaphylactic type reactions to a variety of stimuli.  This occurred in 2011.  She has been on prednisone at various times and occasionally has had to get steroid injections.  Michelle [name] says that she had been in Michelle emergency room just this weekend with some abdominal/flank pain for which she had a CT scan.  She was on prednisone at that time.  She received Dilaudid and Bentyl, but says that she did not receive any steroids.  At times she has taken a dose pack and has been on as much as 20 mg a day.  She stopped taking Michelle prednisone just this past weekend, somewhere around November 22nd.  As noted above, Michelle [name]'s white count recently has been elevated.  Specifically, on 09/27/2012 white count was 16.8 with normal differential.  On 10/06/2012 white count was 20.4, on 10/11/2012 19.2, on 10/12/2012 22.3.  Today Michelle white count is 13.0.  As stated, Michelle prednisone apparently was stopped somewhere around November 22nd.  During Michelle time that Michelle white count was running around 20,000 Michelle [name] says she had an upper respiratory infection and was coughing up greenish phlegm.  We have a white count of 11.7 on 05/24/2012 and then 8.8 on March 01, 2012, 7.7 on October 31, 2011.  As noted above, white count has been periodically elevated.  At Michelle present time Michelle [name] feels pretty well.  She does have nausea without vomiting.  I believe this is fairly chronic.  She is no longer having any abdominal pain and her upper respiratory symptoms seem to have resolved.  PAST MEDICAL HISTORY/SURGICAL HISTORY:  Noted above.  As a child she suffered some cut tendons in her left lower arm, also her right leg. Michelle patient has undergone liposuction  and buttock implants. She denies any knowledge of previous blood transfusions.  ALLERGIES:  She has a number of allergies listed which include certain nutritional supplements, Pneumovax, tetanus, Wellbutrin, Bactrim, clarithromycin, doxycycline, chlorthalidone.  Reglan apparently causes tremor.  Zantac acts like a diuretic.  She had vasculitis from chlorthalidone.  FAMILY HISTORY:  Maternal grandfather died in his 29s of leukemia, type unknown.  Maternal aunt died in her 90s also of leukemia.  Paternal grandmother had diabetes.  Mother has heart disease and has had cardiac surgery.  Father died at age 66 of COPD.  Michelle patient has 6 siblings. Two or 3 brothers have heart disease.  A sister also has heart disease and peripheral vascular disease.  A brother has a pacemaker.  Michelle patient has son age 80 with Michelle [name] syndrome.  Mother's sister died of myelodysplastic syndrome at age 77.  No other history of cancer or blood problems.  SOCIAL HISTORY:  Patient denies any use of cigarettes, other tobacco products, alcohol, or drugs.  She was born and raised in [name], graduated [name], and lived in [place] for a few years before moving back to [name] about 13 years ago, which would have been around 2000.  She lives with her husband Michelle [name] and they have been married for 32 years.  He is a Occupational hygienist for a [name].  She had been employed as a [employer] [name], but is unable to work at this time. She does play tennis, likes to garden, is active around her home.  REVIEW OF SYSTEMS:  Michelle patient is without too many complaints today, generally feels well.  She denies any neurologic problems.  She wears glasses.  She says she may have early glaucoma.  Vision and hearing are good.  She denies any sinus problems or hay fever.  She has difficulty eating solid foods which cause nausea.  She denies any vomiting.  Her weight at one point was 123 pounds, but today her weight is 148  pounds, which is generally stable for her.  She has diarrhea alternating with constipation.  She denies any blood in her stools at this time.  She thinks her last colonoscopy was 2 years ago and revealed a polyp.  Last endoscopy was about a year and a half ago apparently by Dr. Jeani [name].  She has been having abdominal discomfort for Michelle past 4 years. She says this may be due to gastroparesis.  She denies any liver problems.  Apparently, Bentyl was effective in treating her abdominal discomfort.  She denies any cardiac problems.  She does have asthma which seems to be in good control.  She has had thyroid surgery for goiter.  This apparently revealed a papillary carcinoma of Michelle thyroid dating back to April 2004.  Michelle patient's last mammogram was 2 years ago.  She says a mammogram has been scheduled for October 27, 2012.  She has undergone breast reduction surgery bilaterally.  She denies any urinary symptoms.  She does have hot flashes.  She has had a hysterectomy.  No swelling in Michelle legs, blood clots, intermittent claudication.  No bleeding or bruising problems.  She denies any musculoskeletal pain, fever, night sweats.  She uses an estrogen patch which she changes twice a week.  She denies any skin problems such as rash or pruritus.  She does see a psychologist, admits to depression, but says she is intolerant of antidepressants.  PHYSICAL EXAM:  General:  Michelle [name] looks well, certainly does not appear to be ill appearing in any way.  Weight is 148.1 pounds, height 5 feet 4 inches, body surface area 1.74 sq m.  Vital Signs:  Blood pressure 118/80.  Pulse 86 and regular, respirations regular and unlabored. Temperature 98.5.  HEENT:  She wears glasses.  No scleral icterus. Pupillary and extraocular movements are normal.  Mouth and pharynx are benign.  Dentition is well maintained.  She has a scar from her thyroid surgery.  Neck:  No adenopathy, thyroid enlargement, or bruit.   Heart and Lungs:  Normal.  Breasts:  Not examined.  No axillary or inguinal adenopathy.  Abdomen:  Generally soft, nontender, somewhat doughy with no organomegaly or  masses palpable.  She has well-healed surgical scars. No obvious splenomegaly.  Extremities:  No peripheral edema, clubbing, palmar erythema, petechiae, or purpura.  Neurologic:  Grossly normal.  LABORATORY DATA:  White count 13.0, ANC 6.9, hemoglobin 14.2, hematocrit 41.8, platelets 262,000.  Absolute lymphocyte count was 4.9 with normal being 0.9-3.3.  There were 52.8% neutrophils, 37.4 lymphs, 6.3% monocytes.  Inspection of Michelle peripheral smear really was unremarkable in terms of any myeloid or erythroid precursors or dyspoietic changes. There were some atypical lymphs and some smudge cells of uncertain significance.  It should be noted that when Michelle patient's white count was 16.8 she maintained a normal differential and Michelle absolute neutrophil count was elevated with a normal absolute lymphocyte count. Chemistries from today are pending.  Chemistries from 10/12/2012 were entirely normal including an albumin of 4.2.  LDH today was 193.  We have a sed rate of 1 from 05/24/2012 at a time when Michelle white count was 11.7.  JAK2 mutation is pending today.  IMAGING STUDIES: 1. Chest x-ray, 2 view, from 10/06/2012 showed no acute disease. 2. Paranasal sinuses from 10/06/2012 showed haziness of Michelle right     maxillary sinus compared to Michelle left.  No air fluid levels were     seen.  Michelle question of mucosal thickening was raised.  If more     definitive imaging was felt to be clinically indicated, CT scan     would be suggested. 3. CT scan of abdomen and pelvis without IV contrast on 10/12/2012     showed postoperative changes at Michelle gastroesophageal junction.  Michelle     patient had a previous cholecystectomy.  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     There was no evidence for sigmoid diverticulitis.  Bilateral     buttock implants were stable.   Liver, spleen, pancreas, and adrenal     glands were all within normal limits.  There was no hydronephrosis     or urinary calculus.  IMPRESSION AND PLAN:  It was my pleasure to see this patient again.  Michelle patient was reassured.  Today her white blood count had decreased down to 13.0.  When Michelle patient was running a white count close to 20,000 in Michelle last week or two, it appears that she was on at least 10 mg of prednisone daily and that she may have had an upper respiratory infection.  It is possible that this combination of circumstances may explain her leukocytosis.  Clearly, there are other factors at play here.  This patient does seem to have a an usually brisk WBC response to certain stimuli.  There also may be factors that cause her to have elevated white count that are poorly understood.  In any event, whatever is going on, I believe, is benign.  We have been seeing an intermittent leukocytosis now for at least 10 years, if not longer.  Once again, Syrena was reassured.  I would be happy to follow her, but I really do not think that is necessary, nor does Denasia.  Apparently, she is being followed rather closely by Dr. Cleta [name] for a variety of other medical issues.  I would certainly be happy to see Khai again should any concerns or changes arise in Michelle future.  I would be quite surprised if her JAK2 mutation comes back positive.  I did not make a return appointment for Michelle [name], but certainly would be happy to see her again.  Addendum: JAK2 mutation was not present.  ______________________________ Michelle Fraction  Kebra [name], M.D. DSM/MEDQ  D:  10/18/2012  T:  10/19/2012  Job:  [id number]

## 2012-10-28 ENCOUNTER — Telehealth: Payer: Self-pay | Admitting: Radiology

## 2012-10-28 NOTE — Telephone Encounter (Signed)
Patient called left message for Dr Cleta Alberts, she is having cold symptoms. Dr Cleta Alberts has advised she should come in for this, and wants [place] to call her, will you call her?

## 2012-10-28 NOTE — Telephone Encounter (Signed)
I called and spoke w/pt, verified that she did get Dr Ellis Parents message back that he advises that she come in to be seen for her cold Sxs. Pt stated she did get his message and thanked him for it. She promised she would come in if she started to feel really bad.

## 2012-11-01 ENCOUNTER — Telehealth: Payer: Self-pay | Admitting: Emergency Medicine

## 2012-11-01 NOTE — Telephone Encounter (Signed)
Called and left message on patient's answering machine. Patient left text message that said her husband was leaving. She then left another message stating it was going to be a bad holiday. I called Karmen Bongo her psychotherapist to advise him of the situation and get his help and input.

## 2012-11-01 NOTE — Telephone Encounter (Signed)
Dr Cleta Alberts has also called Karmen Bongo [phone]. So he will be aware of patients situation, we have been advised by Lawanna Kobus her husband Trey Paula has left the home, Dr Cleta Alberts very concerned about patients well being, separation from her husband has been difficult for her in the past.

## 2012-11-01 NOTE — Telephone Encounter (Signed)
Selena Batten from [employer] Called regarding well fair check on patient. patient ok, not injured. Everything looked fine. Her and spouse were going to try and work it out. Called Dr. Cleta Alberts with info and he stated husband called him and said they were going f/u up with therapist Mr. Roseanne Reno.

## 2012-11-01 NOTE — Telephone Encounter (Signed)
Message left on cell phone per patient a return call to the office .

## 2012-11-01 NOTE — Telephone Encounter (Signed)
Phone call to patient. Left message to call Amy to advise how she is feeling. She is also advise to contact Karmen Bongo her therapist.

## 2012-11-03 ENCOUNTER — Encounter: Payer: Self-pay | Admitting: Gynecology

## 2012-11-18 ENCOUNTER — Other Ambulatory Visit: Payer: Self-pay

## 2012-11-18 ENCOUNTER — Telehealth: Payer: Self-pay | Admitting: *Deleted

## 2012-11-18 NOTE — Telephone Encounter (Signed)
Pt is wanting to have [place] refill her aprazolam to [facility] on [address] [phone]

## 2012-11-18 NOTE — Telephone Encounter (Signed)
Pharmacy requesting refill on alprazolam 1mg .  Last refilled on 11/07/12

## 2012-11-18 NOTE — Telephone Encounter (Signed)
Pharmacy request sent to you already

## 2012-11-19 NOTE — Telephone Encounter (Signed)
Michelle [name] can have a refill on her Xanax. She takes Xanax 1 mg. She is allowed to have #10 tablets. She takes one at bedtime and a half tablet during the day as necessary. She is allowed to get her refill each week on schedule. She can have refills x5

## 2012-11-19 NOTE — Telephone Encounter (Signed)
Patient is calling to check on the status of her rx refill

## 2012-11-20 NOTE — Telephone Encounter (Signed)
Called in Rx

## 2012-11-20 NOTE — Telephone Encounter (Signed)
Pt.notified

## 2012-11-25 ENCOUNTER — Ambulatory Visit: Payer: BC Managed Care – PPO

## 2012-11-25 ENCOUNTER — Ambulatory Visit (INDEPENDENT_AMBULATORY_CARE_PROVIDER_SITE_OTHER): Payer: BC Managed Care – PPO | Admitting: Emergency Medicine

## 2012-11-25 ENCOUNTER — Other Ambulatory Visit: Payer: Self-pay | Admitting: Emergency Medicine

## 2012-11-25 VITALS — BP 132/84 | HR 112 | Temp 98.7°F | Resp 18 | Ht 64.0 in | Wt 143.0 lb

## 2012-11-25 DIAGNOSIS — R509 Fever, unspecified: Secondary | ICD-10-CM

## 2012-11-25 DIAGNOSIS — R05 Cough: Secondary | ICD-10-CM

## 2012-11-25 LAB — COMPREHENSIVE METABOLIC PANEL
AST: 61 U/L — ABNORMAL HIGH (ref 0–37)
Alkaline Phosphatase: 64 U/L (ref 39–117)
BUN: 10 mg/dL (ref 6–23)
Creat: 0.69 mg/dL (ref 0.50–1.10)
Potassium: 3.8 mEq/L (ref 3.5–5.3)

## 2012-11-25 LAB — POCT CBC
HCT, POC: 46.3 % (ref 37.7–47.9)
Hemoglobin: 14.9 g/dL (ref 12.2–16.2)
Lymph, poc: 3.6 — AB (ref 0.6–3.4)
MCH, POC: 30.5 pg (ref 27–31.2)
MCHC: 32.2 g/dL (ref 31.8–35.4)
MCV: 94.8 fL (ref 80–97)
POC LYMPH PERCENT: 51.1 %L — AB (ref 10–50)
RDW, POC: 13.3 %
WBC: 7.1 10*3/uL (ref 4.6–10.2)

## 2012-11-25 MED ORDER — ALBUTEROL SULFATE (2.5 MG/3ML) 0.083% IN NEBU
2.5000 mg | INHALATION_SOLUTION | Freq: Once | RESPIRATORY_TRACT | Status: AC
  Administered 2012-11-25: 2.5 mg via RESPIRATORY_TRACT

## 2012-11-25 MED ORDER — PREDNISONE 10 MG PO TABS: ORAL_TABLET | ORAL | Status: DC

## 2012-11-25 NOTE — Progress Notes (Signed)
  Subjective:    Patient ID: Michelle [name], female    DOB: 11-26-1960, 52 y.o.   MRN: [id number]  HPI patient enters with onset of upper respiratory tract symptoms associated with a cough the cough has been productive of clear phlegm. She has significant nasal congestion as well as wheezing. She is a long history of hypersensitivity. She actually felt well until Monday when her symptoms began. He has not had a flu shot this year because she has a flu allergy.    Review of Systems     Objective:   Physical Exam patient is alert and cooperative with a husky-type cough is supple. Her chest is exam reveals rhonchi present in both lung fields with good airxchange. Her cardiac exam is regular rate without murmurs rubs or gallops. The abdomen is soft and nontender. [facility] reading (PRIMARY) by  Dr.Ashley Montminy there are mild increased markings in the right middle lobe     Results for orders placed in visit on 11/25/12  POCT CBC      Component Value Range   WBC 7.1  4.6 - 10.2 K/uL   Lymph, poc 3.6 (*) 0.6 - 3.4   POC LYMPH PERCENT 51.1 (*) 10 - 50 %L   MID (cbc) 0.8  0 - 0.9   POC MID % 11.1  0 - 12 %M   POC Granulocyte 2.7  2 - 6.9   Granulocyte percent 37.8  37 - 80 %G   RBC 4.88  4.04 - 5.48 M/uL   Hemoglobin 14.9  12.2 - 16.2 g/dL   HCT, POC 40.9  81.1 - 47.9 %   MCV 94.8  80 - 97 fL   MCH, POC 30.5  27 - 31.2 pg   MCHC 32.2  31.8 - 35.4 g/dL   RDW, POC 91.4     Platelet Count, POC 224  142 - 424 K/uL   MPV 8.3  0 - 99.8 fL  POCT INFLUENZA A/B      Component Value Range   Influenza A, POC Negative     Influenza B, POC Negative        Assessment & Plan:      Her CXR has mild increased RML markings. Her testing points towards a viral type illness. Will treat with prednisone in low dose at 20 mg a day for 2 days 15 a day for 2 days 10 a day for 2 days 5 mg. a day for 2 days. She can use her inhaler every 4-6 hours. I did not write her for cough medications because of her past history.  She will force fluids and rest. Her white blood count was normal compared to her last testing. She has been intolerant of oral steroid inhalers in the past

## 2012-11-28 ENCOUNTER — Telehealth: Payer: Self-pay | Admitting: Emergency Medicine

## 2012-11-28 NOTE — Telephone Encounter (Signed)
I called, she is still coughing, not getting anything up, she states she drank a "swig" of Liquor and this helped. I had called earlier, did not get answer.

## 2012-11-28 NOTE — Telephone Encounter (Signed)
Please call patient and do a status check on her symptoms.

## 2012-11-29 NOTE — Telephone Encounter (Signed)
Called patient. She states her husband came home and was sick also, he feels better. She sounds much better, states she feels better. She will

## 2012-11-29 NOTE — Telephone Encounter (Signed)
Please call patient and advise if she starts running fevers or develops a productive cough of yellowish or greenish phlegm please let us know.

## 2012-11-30 NOTE — Telephone Encounter (Signed)
Sorry, she will let me know if she gets worse or if she does not improve. Thank you.

## 2012-12-19 ENCOUNTER — Other Ambulatory Visit: Payer: Self-pay | Admitting: Physician Assistant

## 2012-12-20 ENCOUNTER — Other Ambulatory Visit: Payer: Self-pay | Admitting: Internal Medicine

## 2012-12-22 ENCOUNTER — Other Ambulatory Visit: Payer: Self-pay | Admitting: Radiology

## 2012-12-22 ENCOUNTER — Telehealth: Payer: Self-pay | Admitting: Radiology

## 2012-12-22 MED ORDER — ALPRAZOLAM 1 MG PO TABS: 1.0000 mg | ORAL_TABLET | Freq: Every day | ORAL | Status: DC

## 2012-12-22 NOTE — Telephone Encounter (Signed)
Dr Cleta Alberts has spoken to patient about her meds, he states okay for her to have refills on her Alprazolam for 3 months, she gets this 1 week at a time. I sent in 6 weeks worth for her, this is the most I can put in the computer, but when these run out I can renew, per Dr Cleta Alberts. Amy Littrell

## 2012-12-26 ENCOUNTER — Telehealth: Payer: Self-pay | Admitting: Radiology

## 2012-12-26 NOTE — Telephone Encounter (Signed)
Patient called, her meds not rc'd at pharmacy. Called in for her.

## 2013-01-10 ENCOUNTER — Other Ambulatory Visit: Payer: Self-pay | Admitting: Emergency Medicine

## 2013-01-15 ENCOUNTER — Other Ambulatory Visit: Payer: Self-pay | Admitting: Physician Assistant

## 2013-02-08 NOTE — Progress Notes (Signed)
Reviewed and agree.

## 2013-02-10 ENCOUNTER — Telehealth: Payer: Self-pay

## 2013-02-10 NOTE — Telephone Encounter (Signed)
Patient would like refill of alprazolam.

## 2013-02-11 NOTE — Telephone Encounter (Signed)
Can we refill? 

## 2013-02-11 NOTE — Telephone Encounter (Signed)
Okay to refill her medications. She can have 6 refills. She gets a total of 10 tablets and can refill them each week

## 2013-02-11 NOTE — Telephone Encounter (Signed)
Called in Rx to [facility] pt notified.

## 2013-02-24 ENCOUNTER — Other Ambulatory Visit: Payer: Self-pay | Admitting: Emergency Medicine

## 2013-02-25 ENCOUNTER — Telehealth: Payer: Self-pay | Admitting: Emergency Medicine

## 2013-02-25 ENCOUNTER — Ambulatory Visit (INDEPENDENT_AMBULATORY_CARE_PROVIDER_SITE_OTHER): Payer: BC Managed Care – PPO | Admitting: Internal Medicine

## 2013-02-25 VITALS — BP 126/84 | HR 120 | Temp 98.4°F | Resp 18 | Ht 64.0 in | Wt 138.0 lb

## 2013-02-25 DIAGNOSIS — J22 Unspecified acute lower respiratory infection: Secondary | ICD-10-CM

## 2013-02-25 DIAGNOSIS — J988 Other specified respiratory disorders: Secondary | ICD-10-CM

## 2013-02-25 DIAGNOSIS — J45909 Unspecified asthma, uncomplicated: Secondary | ICD-10-CM

## 2013-02-25 MED ORDER — PREDNISONE 10 MG PO TABS: ORAL_TABLET | ORAL | Status: DC

## 2013-02-25 MED ORDER — AZITHROMYCIN 250 MG PO TABS: ORAL_TABLET | ORAL | Status: DC

## 2013-02-25 NOTE — Telephone Encounter (Signed)
The patient states she has been sick for one week now. She has head congestion, drainage, and a productive cough she was advised to come in for check.

## 2013-02-25 NOTE — Progress Notes (Signed)
  Subjective:    Patient ID: Michelle [name], female    DOB: 12-14-1960, 52 y.o.   MRN: [id number]  HPI complaining of a 7 day history of a cough progressively worse Now productive of discolored sputum randomly Fatigue but no fever or chills or night sweats No sore throat/minimal sinus congestion No nausea but occasional loose stools Has remained fairly active Beginning to notice a shortness of breath with coughing and waking with wheezing during the night/cough interferes with sleep  No current allergy symptoms Similar illness in January    Review of Systems     Objective:   Physical Exam Vital signs stable except pulse 120/repeated at 105 ex room Conjunctiva not injected TMs clear Nares clear Throat clear Lungs with mild rhonchi in the upper left posteriorly that clear with cough Mild wheezing with forced expiration heard anteriorly       Assessment & Plan:  Problem #1 lower respiratory infection with reactive airway disease  Meds ordered this encounter  Medications  . azithromycin (ZITHROMAX) 250 MG tablet    Sig: As packaged    Dispense:  6 tablet    Refill:  0  . predniSONE (DELTASONE) 10 MG tablet    Sig: Take 2 a day for 2 days one and  one half a day for 2 days one a day for 2 days one half tablet a day for 2 days    Dispense:  10 tablet    Refill:  0

## 2013-03-14 ENCOUNTER — Ambulatory Visit: Payer: BC Managed Care – PPO

## 2013-03-14 ENCOUNTER — Ambulatory Visit (INDEPENDENT_AMBULATORY_CARE_PROVIDER_SITE_OTHER): Payer: BC Managed Care – PPO | Admitting: Family Medicine

## 2013-03-14 VITALS — BP 126/80 | HR 106 | Temp 98.5°F | Resp 18 | Ht 64.0 in | Wt 136.0 lb

## 2013-03-14 DIAGNOSIS — R05 Cough: Secondary | ICD-10-CM

## 2013-03-14 DIAGNOSIS — J209 Acute bronchitis, unspecified: Secondary | ICD-10-CM

## 2013-03-14 DIAGNOSIS — J45909 Unspecified asthma, uncomplicated: Secondary | ICD-10-CM

## 2013-03-14 DIAGNOSIS — J302 Other seasonal allergic rhinitis: Secondary | ICD-10-CM

## 2013-03-14 DIAGNOSIS — J309 Allergic rhinitis, unspecified: Secondary | ICD-10-CM

## 2013-03-14 DIAGNOSIS — J45901 Unspecified asthma with (acute) exacerbation: Secondary | ICD-10-CM

## 2013-03-14 LAB — POCT CBC
HCT, POC: 44.9 % (ref 37.7–47.9)
Hemoglobin: 14.4 g/dL (ref 12.2–16.2)
Lymph, poc: 5.1 — AB (ref 0.6–3.4)
MCH, POC: 29.8 pg (ref 27–31.2)
MCHC: 32.1 g/dL (ref 31.8–35.4)
MCV: 92.8 fL (ref 80–97)
WBC: 13.2 10*3/uL — AB (ref 4.6–10.2)

## 2013-03-14 MED ORDER — CEFDINIR 300 MG PO CAPS: 300.0000 mg | ORAL_CAPSULE | Freq: Two times a day (BID) | ORAL | Status: DC

## 2013-03-14 MED ORDER — METHYLPREDNISOLONE ACETATE 80 MG/ML IJ SUSP
120.0000 mg | Freq: Once | INTRAMUSCULAR | Status: AC
  Administered 2013-03-14: 120 mg via INTRAMUSCULAR

## 2013-03-14 MED ORDER — ALBUTEROL SULFATE (2.5 MG/3ML) 0.083% IN NEBU: 5.0000 mg | INHALATION_SOLUTION | Freq: Once | RESPIRATORY_TRACT | Status: DC

## 2013-03-14 MED ORDER — HYDROCOD POLST-CHLORPHEN POLST 10-8 MG/5ML PO LQCR
5.0000 mL | Freq: Two times a day (BID) | ORAL | Status: DC | PRN
Start: 2013-03-14 — End: 2013-03-30

## 2013-03-14 MED ORDER — PREDNISONE 10 MG PO TABS: ORAL_TABLET | ORAL | Status: DC

## 2013-03-14 MED ORDER — LEVALBUTEROL TARTRATE 45 MCG/ACT IN AERO: 1.0000 | INHALATION_SPRAY | RESPIRATORY_TRACT | Status: DC | PRN

## 2013-03-14 MED ORDER — ALBUTEROL SULFATE (2.5 MG/3ML) 0.083% IN NEBU
2.5000 mg | INHALATION_SOLUTION | Freq: Once | RESPIRATORY_TRACT | Status: AC
  Administered 2013-03-14: 2.5 mg via RESPIRATORY_TRACT

## 2013-03-14 NOTE — Patient Instructions (Addendum)
Acute Bronchitis You have acute bronchitis. This means you have a chest cold. The airways in your lungs are red and sore (inflamed). Acute means it is sudden onset.  CAUSES Bronchitis is most often caused by the same virus that causes a cold. SYMPTOMS   Body aches.  Chest congestion.  Chills.  Cough.  Fever.  Shortness of breath.  Sore throat. TREATMENT  Acute bronchitis is usually treated with rest, fluids, and medicines for relief of fever or cough. Most symptoms should go away after a few days or a week. Increased fluids may help thin your secretions and will prevent dehydration. Your caregiver may give you an inhaler to improve your symptoms. The inhaler reduces shortness of breath and helps control cough. You can take over-the-counter pain relievers or cough medicine to decrease coughing, pain, or fever. A cool-air vaporizer may help thin bronchial secretions and make it easier to clear your chest. Antibiotics are usually not needed but can be prescribed if you smoke, are seriously ill, have chronic lung problems, are elderly, or you are at higher risk for developing complications.Allergies and asthma can make bronchitis worse. Repeated episodes of bronchitis may cause longstanding lung problems. Avoid smoking and secondhand smoke.Exposure to cigarette smoke or irritating chemicals will make bronchitis worse. If you are a cigarette smoker, consider using nicotine gum or skin patches to help control withdrawal symptoms. Quitting smoking will help your lungs heal faster. Recovery from bronchitis is often slow, but you should start feeling better after 2 to 3 days. Cough from bronchitis frequently lasts for 3 to 4 weeks. To prevent another bout of acute bronchitis:  Quit smoking.  Wash your hands frequently to get rid of viruses or use a hand sanitizer.  Avoid other people with cold or virus symptoms.  Try not to touch your hands to your mouth, nose, or eyes. SEEK IMMEDIATE  MEDICAL CARE IF:  You develop increased fever, chills, or chest pain.  You have severe shortness of breath or bloody sputum.  You develop dehydration, fainting, repeated vomiting, or a severe headache.  You have no improvement after 1 week of treatment or you get worse. MAKE SURE YOU:   Understand these instructions.  Will watch your condition.  Will get help right away if you are not doing well or get worse. Document Released: 12/17/2004 Document Revised: 02/01/2012 Document Reviewed: 03/04/2011 [facility] Patient Information 2013 [place], [place].   Asthma, Acute Bronchospasm Your exam shows you have asthma, or acute bronchospasm that acts like asthma. Bronchospasm means your air passages become narrowed. These conditions are due to inflammation and airway spasm that cause narrowing of the bronchial tubes in the lungs. This causes you to have wheezing and shortness of breath. CAUSES  Respiratory infections and allergies most often bring on these attacks. Smoking, air pollution, cold air, emotional upsets, and vigorous exercise can also bring them on.  TREATMENT   Treatment is aimed at making the narrowed airways larger. Mild asthma/bronchospasm is usually controlled with inhaled medicines. Albuterol is a common medicine that you breathe in to open spastic or narrowed airways. Some trade names for albuterol are Ventolin or Proventil. Steroid medicine is also used to reduce the inflammation when an attack is moderate or severe. Antibiotics (medications used to kill germs) are only used if a bacterial infection is present.  If you are pregnant and need to use Albuterol (Ventolin or Proventil), you can expect the baby to move more than usual shortly after the medicine is used. HOME CARE INSTRUCTIONS  Rest.  Drink plenty of liquids. This helps the mucus to remain thin and easily coughed up. Do not use caffeine or alcohol.  Do not smoke. Avoid being exposed to second-hand smoke.  You  play a critical role in keeping yourself in good health. Avoid exposure to things that cause you to wheeze. Avoid exposure to things that cause you to have breathing problems. Keep your medications up-to-date and available. Carefully follow your doctor's treatment plan.  When pollen or pollution is bad, keep windows closed and use an air conditioner go to places with air conditioning. If you are allergic to furry pets or birds, find new homes for them or keep them outside.  Take your medicine exactly as prescribed.  Asthma requires careful medical attention. See your caregiver for follow-up as advised. If you are more than [redacted] weeks pregnant and you were prescribed any new medications, let your Obstetrician know about the visit and how you are doing. Arrange a recheck. SEEK IMMEDIATE MEDICAL CARE IF:   You are getting worse.  You have trouble breathing. If severe, call 911.  You develop chest pain or discomfort.  You are throwing up or not drinking fluids.  You are not getting better within 24 hours.  You are coughing up yellow, green, brown, or bloody sputum.  You develop a fever over 102 F (38.9 C).  You have trouble swallowing. MAKE SURE YOU:   Understand these instructions.  Will watch your condition.  Will get help right away if you are not doing well or get worse. Document Released: 02/24/2007 Document Revised: 02/01/2012 Document Reviewed: 10/24/2007 [facility] Patient Information 2013 [place], [place].

## 2013-03-14 NOTE — Progress Notes (Signed)
Subjective:    Patient ID: Michelle [name], female    DOB: Oct 18, 1961, 52 y.o.   MRN: [id number] Chief Complaint  Patient presents with  . Cough    02/25/13 recheck for RAD, productive some yellowish in mornings, then clear  . Nasal Congestion    and PND  . Fatigue    HPI  Michelle [name] is a delightful 52 yo with a PMHx of asthma and allergies who was seen about 2 wks ago for asthma exac treated with zpack and prednisone.  She is using zyretc every night, albuterol a ton - far more often than rx'ed - 4 to 6 puffs every few hours, otc robitussin.  She thinks that perhaps the prednisone course wasn't long enough as it initially did seem to help.  She can't do higher doses of prednisone at 50 or 60 mg as they really upset her stomach.  Coughing really getting old - driving her husband crazy, can't sleep at night.  Cough productive of very thick clear and yellow sputum. No f/c. No CP. Tol po well.  Past Medical History  Diagnosis Date  . Hypercholesteremia   . Reflux   . Gastroparesis   . VAIN I (vaginal intraepithelial neoplasia grade I) 09/2012     colposcopic biopsies 02/2012 VAIN !  . GERD (gastroesophageal reflux disease)   . Hypertension   . Osteopenia 05/2009    -1.9  . Hypothyroidism   . Thyroid cancer     papillary  . PONV (postoperative nausea and vomiting)   . Osteoarthritis   . Asthma   . Gastroparesis     history of  . Headache     migraines  . Seizures     1 seizure secondary to wellbutrin-none since  . Allergy   . Anaphylaxis   . Depression    Current Outpatient Prescriptions on File Prior to Visit  Medication Sig Dispense Refill  . ALPRAZolam (XANAX) 1 MG tablet Take 1 tablet (1 mg total) by mouth at bedtime. Patient may take one to one and 1/2 at bedtime prn may refill on schedule  9 tablet  5  . aspirin EC 81 MG tablet Take 81 mg by mouth daily.        . cetirizine (ZYRTEC) 10 MG tablet TAKE ONE TABLET BY MOUTH ONE TIME DAILY  30 tablet  9  . EPINEPHrine  (EPI-PEN) 0.3 mg/0.3 mL DEVI Inject 0.3 mg into the muscle as needed. For allergic reactions      . estradiol (VIVELLE-DOT) 0.05 MG/24HR Place 1 patch onto the skin 2 (two) times a week.      . ezetimibe (ZETIA) 10 MG tablet Take 10 mg by mouth daily.      Marland Kitchen levothyroxine (SYNTHROID, LEVOTHROID) 100 MCG tablet Take 100 mcg by mouth daily.      . metoprolol succinate (TOPROL-XL) 25 MG 24 hr tablet Take 25 mg by mouth daily.      . metoprolol succinate (TOPROL-XL) 25 MG 24 hr tablet TAKE ONE TABLET BY MOUTH ONE TIME DAILY  30 tablet  5  . ondansetron (ZOFRAN-ODT) 8 MG disintegrating tablet Take 8 mg by mouth every 8 (eight) hours as needed. Nausea      . PREVACID SOLUTAB 30 MG disintegrating tablet DISSOLVE ONE TABLET IN MOUTH TWICE DAILY  60 tablet  1  . rosuvastatin (CRESTOR) 20 MG tablet Take 20 mg by mouth daily.      . travoprost, benzalkonium, (TRAVATAN) 0.004 % ophthalmic solution Place 1 drop into  both eyes at bedtime.       . valACYclovir (VALTREX) 500 MG tablet Take 500 mg by mouth 2 (two) times daily as needed. For fever blisters      . vitamin B-12 (CYANOCOBALAMIN) 1000 MCG tablet Take 1,000 mcg by mouth daily.        Marland Kitchen CARAFATE 1 GM/10ML suspension TAKE TWO TEASPOONFULS BY MOUTH FOUR TIMES DAILY ON EMPTY STOMACH  300 mL  1   No current facility-administered medications on file prior to visit.   Allergies  Allergen Reactions  . Nutritional Supplements Anaphylaxis  . Pneumococcal Vaccines Anaphylaxis  . Tetanus Toxoids Swelling  . Wellbutrin (Bupropion Hcl) Other (See Comments)    seizure  . Bactrim Hives and Other (See Comments)    Severe headache  . Clarithromycin Itching and Other (See Comments)    Severe headache  . Doxycycline Other (See Comments)    Severe GI upset  . Hydone (Chlorthalidone) Other (See Comments)    Vasculitis   . Ranitidine Other (See Comments)    Acts like a diuretic   . Reglan (Metoclopramide)   . Sulfa Antibiotics Hives and Other (See Comments)     Severe headache  . Tetracyclines & Related Other (See Comments)    Severe GI upset     Review of Systems  Constitutional: Positive for activity change and fatigue. Negative for fever, chills, diaphoresis and appetite change.  HENT: Positive for congestion, rhinorrhea, postnasal drip and sinus pressure. Negative for sore throat and sneezing.   Respiratory: Positive for cough, chest tightness, shortness of breath and wheezing. Negative for stridor.   Cardiovascular: Negative for chest pain.  Gastrointestinal: Negative for nausea and vomiting.  Skin: Negative for rash.  Hematological: Negative for adenopathy.  Psychiatric/Behavioral: Positive for sleep disturbance.      BP 126/80  Pulse 104  Temp(Src) 98.5 F (36.9 C) (Oral)  Resp 24  Ht 5\' 4"  (1.626 m)  Wt 136 lb (61.689 kg)  BMI 23.33 kg/m2  SpO2 97%  LMP 05/23/2006 peak flow 340 Objective:   Physical Exam  Constitutional: She is oriented to person, place, and time. She appears well-developed and well-nourished. No distress.  HENT:  Head: Normocephalic and atraumatic.  Right Ear: External ear normal. Tympanic membrane is retracted. Tympanic membrane is not injected. A middle ear effusion is present.  Left Ear: External ear normal. Tympanic membrane is retracted. Tympanic membrane is not injected. A middle ear effusion is present.  Nose: Nasal septal hematoma present. No mucosal edema or rhinorrhea.  Mouth/Throat: Uvula is midline, oropharynx is clear and moist and mucous membranes are normal.  Septal deviation to left  Eyes: Conjunctivae are normal. Right eye exhibits no discharge. Left eye exhibits no discharge. No scleral icterus.  Neck: Neck supple. No thyromegaly present.  Cardiovascular: Normal rate, regular rhythm and normal heart sounds.   Pulmonary/Chest: No accessory muscle usage. Tachypnea noted. No respiratory distress. She has decreased breath sounds. She has wheezes in the right lower field, the left upper field  and the left lower field. She has no rhonchi. She has no rales.  Exp wheezing on right ant mediastum and post LUF mild  Musculoskeletal: She exhibits no edema and no tenderness.  Lymphadenopathy:    She has no cervical adenopathy.  Neurological: She is alert and oriented to person, place, and time.  Skin: Skin is warm and dry. She is not diaphoretic. No erythema.  Psychiatric: She has a normal mood and affect. Her behavior is normal.  Results for orders placed in visit on 03/14/13  POCT CBC      Result Value Range   WBC 13.2 (*) 4.6 - 10.2 K/uL   Lymph, poc 5.1 (*) 0.6 - 3.4   POC LYMPH PERCENT 38.4  10 - 50 %L   MID (cbc) 1.2 (*) 0 - 0.9   POC MID % 9.0  0 - 12 %M   POC Granulocyte 6.9  2 - 6.9   Granulocyte percent 52.6  37 - 80 %G   RBC 4.84  4.04 - 5.48 M/uL   Hemoglobin 14.4  12.2 - 16.2 g/dL   HCT, POC 16.1  09.6 - 47.9 %   MCV 92.8  80 - 97 fL   MCH, POC 29.8  27 - 31.2 pg   MCHC 32.1  31.8 - 35.4 g/dL   RDW, POC 04.5     Platelet Count, POC 294  142 - 424 K/uL   MPV 8.3  0 - 99.8 fL   [facility] reading (PRIMARY) by  Dr. Clelia Croft. CXR: No acute abnormality seen.  Assessment & Plan:  Asthma exac - Given alb neb x 2 in office for relief. Intolerant of inhaled steroids so will repeat more prolonged course of steroids along w/ omnicef (allergic to doxy and bactrim, many antibiotic allergies).  DepoMedrol 120mg  IM x 1 given in office - then start oral pred taper in 2-3d. Try switching from albuterol to xopenex for more effective treatment and try holding toprol during acute exacerbations so not inhibiting beta agonist in lungs but ok to restart when alb use decreases.

## 2013-03-19 ENCOUNTER — Other Ambulatory Visit: Payer: Self-pay

## 2013-03-19 NOTE — Telephone Encounter (Signed)
Patient would like an rx for valtroxyn (a topical treatment) for cold sores rather than valtrex which tears up her stomach.

## 2013-03-20 ENCOUNTER — Ambulatory Visit (INDEPENDENT_AMBULATORY_CARE_PROVIDER_SITE_OTHER): Payer: BC Managed Care – PPO | Admitting: Internal Medicine

## 2013-03-20 ENCOUNTER — Ambulatory Visit: Payer: BC Managed Care – PPO

## 2013-03-20 ENCOUNTER — Telehealth: Payer: Self-pay

## 2013-03-20 VITALS — BP 144/80 | HR 89 | Temp 98.4°F | Resp 18 | Ht 64.0 in | Wt 134.0 lb

## 2013-03-20 DIAGNOSIS — S92912A Unspecified fracture of left toe(s), initial encounter for closed fracture: Secondary | ICD-10-CM

## 2013-03-20 DIAGNOSIS — M79609 Pain in unspecified limb: Secondary | ICD-10-CM

## 2013-03-20 DIAGNOSIS — M79672 Pain in left foot: Secondary | ICD-10-CM

## 2013-03-20 DIAGNOSIS — S92919A Unspecified fracture of unspecified toe(s), initial encounter for closed fracture: Secondary | ICD-10-CM

## 2013-03-20 MED ORDER — ACYCLOVIR 5 % EX OINT: TOPICAL_OINTMENT | CUTANEOUS | Status: DC

## 2013-03-20 NOTE — Progress Notes (Signed)
  Subjective:    Patient ID: Michelle [name], female    DOB: 12-15-1960, 52 y.o.   MRN: [id number]  HPI is moving home Jammed left foot in the wall last night/today swollen and bruised with pain on walking or moving the foot Cannot wear shoes The pain interfered with sleep last night Fifth toe was observed to be displaced laterally and she moved it and buddy taped it to the fourth toe overnight    Review of Systems     Objective:   Physical Exam BP 144/80  Pulse 89  Temp(Src) 98.4 F (36.9 C) (Oral)  Resp 18  Ht 5\' 4"  (1.626 m)  Wt 134 lb (60.782 kg)  BMI 22.99 kg/m2  SpO2 99%  LMP 05/23/2006 Left foot with extensive ecchymoses dorsally along the lateral aspect Tender over the fifth toe and pain with any range of motion Nontender over metatarsals Good peripheral pulses     [facility] reading (PRIMARY) by  Dr. Doolittle=ND fx 5th prox phalanx   Assessment & Plan:  Fracture, toe, left, closed, initial encounter  Left foot pain secondary  Buddy tape toe 5 to toe four Short Cam Walker needed to allow weightbearing/postop shoe insufficient Call if needs pain medication other than over-the-counter Elevate foot next 48 hours with  ice applications Recheck in 2-3 weeks/sooner if worse Re\re x-ray should not be needed unless complications develop   She also wanted to get a refill of the medication used by her dentist for oral herpes simplex/she could not remember the name and will call me tonight or tomorrow for possible refill

## 2013-03-20 NOTE — Telephone Encounter (Signed)
Spoke to patient- Dr. Cleta Alberts has filled her Valtrex in the past. She is requesting the topical Acyclovir- Zovirax. She is currently taking prednisone and with the two medications together she is having stomach upset. She is under some stress due to moving and the blisters are starting to break through. Can we order this medication for her? Order is pending.

## 2013-03-20 NOTE — Telephone Encounter (Signed)
Pt is calling to let Merla Riches know that the medication they both were trying to figure out is called Acyclovir

## 2013-03-21 MED ORDER — ACYCLOVIR-LIDOCAINE HCL 4-4 % EX OINT: 1.0000 "application " | TOPICAL_OINTMENT | CUTANEOUS | Status: DC

## 2013-03-21 NOTE — Telephone Encounter (Signed)
Prescribing and Access Restrictions  Product only available to dental professionals For this product

## 2013-03-21 NOTE — Telephone Encounter (Signed)
Thanks. Called her to advise.  

## 2013-03-21 NOTE — Telephone Encounter (Signed)
I received this note in my  in basket and did not know if it had all been taking care of

## 2013-03-21 NOTE — Telephone Encounter (Signed)
To you 

## 2013-03-21 NOTE — Telephone Encounter (Signed)
From up to date: Prescribing and Access Restrictions  Product only available to dental professionals For Viroxyn

## 2013-03-21 NOTE — Telephone Encounter (Signed)
Called her. She now states it is viroxyn

## 2013-03-21 NOTE — Telephone Encounter (Signed)
May send in rx if needed pended

## 2013-03-22 NOTE — Telephone Encounter (Signed)
Yes, she had asked message to be sent to you, apparently she sent you emails regarding this, want you to be aware it is taken care of.

## 2013-03-28 ENCOUNTER — Ambulatory Visit (INDEPENDENT_AMBULATORY_CARE_PROVIDER_SITE_OTHER): Payer: BC Managed Care – PPO | Admitting: Emergency Medicine

## 2013-03-28 ENCOUNTER — Encounter: Payer: Self-pay | Admitting: Emergency Medicine

## 2013-03-28 VITALS — BP 112/90 | HR 102 | Temp 99.6°F | Resp 16 | Ht 64.0 in | Wt 132.0 lb

## 2013-03-28 DIAGNOSIS — E785 Hyperlipidemia, unspecified: Secondary | ICD-10-CM

## 2013-03-28 DIAGNOSIS — R002 Palpitations: Secondary | ICD-10-CM

## 2013-03-28 DIAGNOSIS — J45909 Unspecified asthma, uncomplicated: Secondary | ICD-10-CM

## 2013-03-28 LAB — CBC WITH DIFFERENTIAL/PLATELET
Hemoglobin: 14.3 g/dL (ref 12.0–15.0)
Lymphocytes Relative: 16 % (ref 12–46)
Lymphs Abs: 1.8 10*3/uL (ref 0.7–4.0)
MCV: 88.8 fL (ref 78.0–100.0)
Monocytes Relative: 9 % (ref 3–12)
Neutrophils Relative %: 73 % (ref 43–77)
Platelets: 204 10*3/uL (ref 150–400)
RBC: 4.75 MIL/uL (ref 3.87–5.11)
WBC: 11.6 10*3/uL — ABNORMAL HIGH (ref 4.0–10.5)

## 2013-03-28 LAB — COMPREHENSIVE METABOLIC PANEL
ALT: 29 U/L (ref 0–35)
Albumin: 4.2 g/dL (ref 3.5–5.2)
CO2: 28 mEq/L (ref 19–32)
Calcium: 9.4 mg/dL (ref 8.4–10.5)
Chloride: 104 mEq/L (ref 96–112)
Glucose, Bld: 75 mg/dL (ref 70–99)
Sodium: 141 mEq/L (ref 135–145)
Total Protein: 6.7 g/dL (ref 6.0–8.3)

## 2013-03-28 LAB — TSH: TSH: 0.014 u[IU]/mL — ABNORMAL LOW (ref 0.350–4.500)

## 2013-03-28 NOTE — Progress Notes (Signed)
  Subjective:    Patient ID: Michelle [name], female    DOB: August 25, 1961, 52 y.o.   MRN: [id number]  HPI    Review of Systems  Constitutional: Positive for appetite change and fatigue.  HENT: Positive for sneezing and neck stiffness.   Eyes: Positive for visual disturbance.  Respiratory: Positive for cough and wheezing.   Cardiovascular: Negative.   Gastrointestinal: Positive for nausea and diarrhea.  Genitourinary: Negative.   Musculoskeletal: Positive for myalgias.  Neurological: Positive for headaches.  Hematological: Bruises/bleeds easily.  Psychiatric/Behavioral: Negative.        Objective:   Physical Exam patient is alert and cooperative in no distress she is a chronic cough. Pupils are equal and reactive to light. Throat is normal. Neck is supple. There is a half by half centimeter ulcerated lesion in the midportion of her lower lip. Carotids are 2+ and symmetrical. There is a bruise-like area behind the right ear. Her chest is clear to auscultation and percussion. Heart is a regular rate without murmurs. Breast exam reveals evidence of a previous breast reduction there are no masses palpable. The abdomen is flat there are no masses felt. Extremities are without edema.        Assessment & Plan:  Routine labs were done today. She is to continue on the same program of medications. She does have less agitation on Xopenex compared albuterol. This is a very bad time of year for her with the allergies and she is to stay and have him wear a mask. She continues close followup with Karmen Bongo. They have purchased a new condominium which is very small to see if she can get along better with her husband. If this does not work out their progressing to divorce. I will go ahead and continue her Xanax at night as previous. I told her if the lesion on her lip is not gone in 3 weeks she is to call me and I will set her up to see the dermatologist about it

## 2013-03-29 ENCOUNTER — Ambulatory Visit: Payer: BC Managed Care – PPO

## 2013-03-29 ENCOUNTER — Telehealth: Payer: Self-pay | Admitting: Radiology

## 2013-03-29 ENCOUNTER — Telehealth: Payer: Self-pay

## 2013-03-29 ENCOUNTER — Ambulatory Visit (INDEPENDENT_AMBULATORY_CARE_PROVIDER_SITE_OTHER): Payer: BC Managed Care – PPO | Admitting: Emergency Medicine

## 2013-03-29 VITALS — BP 122/84 | HR 113 | Temp 99.0°F | Resp 18 | Ht 64.0 in | Wt 132.0 lb

## 2013-03-29 DIAGNOSIS — R9389 Abnormal findings on diagnostic imaging of other specified body structures: Secondary | ICD-10-CM

## 2013-03-29 DIAGNOSIS — R042 Hemoptysis: Secondary | ICD-10-CM

## 2013-03-29 DIAGNOSIS — R918 Other nonspecific abnormal finding of lung field: Secondary | ICD-10-CM

## 2013-03-29 DIAGNOSIS — R05 Cough: Secondary | ICD-10-CM

## 2013-03-29 LAB — POCT CBC
Granulocyte percent: 77.4 %G (ref 37–80)
HCT, POC: 45.1 % (ref 37.7–47.9)
Hemoglobin: 14.2 g/dL (ref 12.2–16.2)
POC Granulocyte: 9.8 — AB (ref 2–6.9)
RBC: 4.79 M/uL (ref 4.04–5.48)

## 2013-03-29 LAB — THYROGLOBULIN LEVEL: Thyroglobulin: 9.1 ng/mL (ref 0.0–55.0)

## 2013-03-29 MED ORDER — CEFDINIR 300 MG PO CAPS: 300.0000 mg | ORAL_CAPSULE | Freq: Two times a day (BID) | ORAL | Status: DC

## 2013-03-29 MED ORDER — ALPRAZOLAM 1 MG PO TABS: 1.0000 mg | ORAL_TABLET | Freq: Every day | ORAL | Status: DC

## 2013-03-29 NOTE — Telephone Encounter (Signed)
Patient requesting tussionex Rx for her cough, please advise.

## 2013-03-29 NOTE — Telephone Encounter (Signed)
PATIENT IS COUGHING UP BLOOD. [phone]

## 2013-03-29 NOTE — Telephone Encounter (Signed)
I cannot write for the Tussionex until Michelle [name] gets back from his trip. Once he gets back she can give me a call and we can decide how to administer this drug

## 2013-03-29 NOTE — Telephone Encounter (Signed)
Spoke to patient she is coming in now.

## 2013-03-29 NOTE — Progress Notes (Signed)
  Subjective:    Patient ID: Michelle [name], female    DOB: 1961/11/19, 52 y.o.   MRN: [id number]  HPI patient was seen yesterday for her regular 6 month checkup. She is started on a low grade fever of 99 yesterday. The spike to 101 yesterday evening. Today at about 10 AM when she awakened she coughed up bright red blood on 2 episodes. Following this she has continued to cough up blood-tinged phlegm. She is recently completed 3 courses of antibiotics. She was treated with cephalexin followed by Z-Pak followed by [facility].. she does not tolerate inhalers well. She has tolerated her Xopenex without difficulty but has not tolerated steroid inhalers in the past. She intermittently has taken low dose of prednisone.    Review of Systems     Objective:   Physical Exam patient appears ill but not toxic. Her neck was supple. Her chest exam revealed no rales and no wheezes. Cardiac exam showed a regular rate no murmurs were heard abdomen was soft without tenderness. she has a deep upper airway raspy-type cough.    [facility] reading (PRIMARY) by  Dr.Shanah Guimaraes appears to be an infiltrate at the right heart border just above the diaphragm. Sinus films show no air-fluid level  Results for orders placed in visit on 03/29/13  POCT CBC      Result Value Range   WBC 12.7 (*) 4.6 - 10.2 K/uL   Lymph, poc 2.1  0.6 - 3.4   POC LYMPH PERCENT 16.6  10 - 50 %L   MID (cbc) 0.8  0 - 0.9   POC MID % 6.0  0 - 12 %M   POC Granulocyte 9.8 (*) 2 - 6.9   Granulocyte percent 77.4  37 - 80 %G   RBC 4.79  4.04 - 5.48 M/uL   Hemoglobin 14.2  12.2 - 16.2 g/dL   HCT, POC 40.9  81.1 - 47.9 %   MCV 94.2  80 - 97 fL   MCH, POC 29.6  27 - 31.2 pg   MCHC 31.5 (*) 31.8 - 35.4 g/dL   RDW, POC 91.4     Platelet Count, POC 209  142 - 424 K/uL   MPV 8.1  0 - 99.8 fL      Assessment & Plan:  Patient presents with episodes of hemoptysis. She has a bad cough and signs and symptoms consistent with tracheitis.  The radiologist does feel  like there is either atelectasis or a right lower lobe infiltrate. We'll go ahead and cover with Omnicef. I spoke with Dr. Shelle Iron they are agreeable to see the patient tomorrow for evaluation. I very much appreciate their help. Referral to be ENT if they feel this is indicated. She has seen Dr. Lazarus Salines in the past. Last laryngoscopy was over 2 years ago. She does also suffer from chronic reflux and has had Nissen procedures x2. This raises the question of chronic reflux versus chronic aspiration as the source of her recurrent respiratory problems. She has also recently moved and there is a great deal of stress regarding her relationship with her husband who is a Occupational hygienist.

## 2013-03-30 ENCOUNTER — Telehealth: Payer: Self-pay

## 2013-03-30 ENCOUNTER — Encounter: Payer: Self-pay | Admitting: Internal Medicine

## 2013-03-30 ENCOUNTER — Other Ambulatory Visit: Payer: Self-pay | Admitting: Emergency Medicine

## 2013-03-30 ENCOUNTER — Ambulatory Visit (INDEPENDENT_AMBULATORY_CARE_PROVIDER_SITE_OTHER): Payer: BC Managed Care – PPO | Admitting: Internal Medicine

## 2013-03-30 ENCOUNTER — Telehealth: Payer: Self-pay | Admitting: Emergency Medicine

## 2013-03-30 VITALS — BP 120/90 | HR 99 | Temp 98.8°F | Ht 64.0 in | Wt 132.0 lb

## 2013-03-30 DIAGNOSIS — R9389 Abnormal findings on diagnostic imaging of other specified body structures: Secondary | ICD-10-CM

## 2013-03-30 DIAGNOSIS — E039 Hypothyroidism, unspecified: Secondary | ICD-10-CM

## 2013-03-30 DIAGNOSIS — R05 Cough: Secondary | ICD-10-CM

## 2013-03-30 DIAGNOSIS — J45909 Unspecified asthma, uncomplicated: Secondary | ICD-10-CM

## 2013-03-30 DIAGNOSIS — R918 Other nonspecific abnormal finding of lung field: Secondary | ICD-10-CM

## 2013-03-30 MED ORDER — PREDNISONE (PAK) 10 MG PO TABS: ORAL_TABLET | ORAL | Status: DC

## 2013-03-30 MED ORDER — LANSOPRAZOLE 30 MG PO TBDP: ORAL_TABLET | ORAL | Status: DC

## 2013-03-30 MED ORDER — FLUTTER DEVI: Status: DC

## 2013-03-30 MED ORDER — LEVOTHYROXINE SODIUM 88 MCG PO TABS: 88.0000 ug | ORAL_TABLET | Freq: Every day | ORAL | Status: DC

## 2013-03-30 MED ORDER — TRAMADOL HCL 50 MG PO TABS: ORAL_TABLET | ORAL | Status: DC

## 2013-03-30 NOTE — Telephone Encounter (Signed)
Please call Michelle [name] check on status tell her I decreased her thyroid dosage to 0.088 due to her abnormal TSH. I scheduled an ultrasound of her neck to evaluate her larynx and thyroid. She sure she keeps her appointment with Dr. Sherene Sires today. I asked the lab to run a free T4 on the blood they have at [facility]. Please be sure this is being done

## 2013-03-30 NOTE — Telephone Encounter (Signed)
He is not coming in until Saturday. She understood. States it is okay. She is to see the pulmonologist today.

## 2013-03-30 NOTE — Telephone Encounter (Signed)
Michelle [name], Pt wants to speak with you about her husband. She states that he was put on tramadol from another doctor and she doesn't think dr Cleta Alberts would approve [phone]

## 2013-03-30 NOTE — Progress Notes (Signed)
  Subjective:    Patient ID: Michelle [name], female    DOB: Feb 24, 1961   MRN: [id number]  HPI  63 yowf never smoker never respiratory problems dx as asthma [place] rx with albuterol maybe twice daily moved to [place] w/in a few years of dx and did fine for the first few years in [place] then needed prednisone for sob and cough then needed advair disc maint> def made cough worse and then around 1st of 2014 cough that didn't respond to albuterol, changed to xopenex > not better,   Cough day = night, lots of mucus green to yellow lighter in color, thick, no better with prednisone.  Cough so hards vomits, takes prevacid but not daily due to diarrhea/  Mostly sob with cough.  No obvious daytime variabilty or assoc  cp or chest tightness, subjective wheeze overt sinus or hb symptoms. No unusual exp hx or h/o childhood pna/ asthma or premature birth to her knowledge.   Already on omnicef per UC with traces of hemoptysis   Review of Systems  Constitutional: Negative for fever, chills and unexpected weight change.  HENT: Positive for congestion. Negative for ear pain, nosebleeds, sore throat, rhinorrhea, sneezing, trouble swallowing, dental problem, voice change, postnasal drip and sinus pressure.   Eyes: Negative for visual disturbance.  Respiratory: Positive for cough and shortness of breath. Negative for choking.   Cardiovascular: Positive for chest pain. Negative for leg swelling.  Gastrointestinal: Negative for vomiting, abdominal pain and diarrhea.  Genitourinary: Negative for difficulty urinating.  Musculoskeletal: Negative for arthralgias.  Skin: Negative for rash.  Neurological: Positive for headaches. Negative for tremors and syncope.  Hematological: Does not bruise/bleed easily.       Objective:   Physical Exam   Pleasant amb wf nad quite pleasant but  failed to answer a single question asked in a straightforward manner, tending to go off on tangents or answer questions with ambiguous  medical terms or diagnoses and seemed aggravated  when asked the same question more than once for clarification. Barking cough present.    Wt Readings from Last 3 Encounters:  03/30/13 132 lb (59.875 kg)  03/29/13 132 lb (59.875 kg)  03/28/13 132 lb (59.875 kg)     HEENT: nl dentition, turbinates, and orophanx. Nl external ear canals without cough reflex   NECK :  without JVD/Nodes/TM/ nl carotid upstrokes bilaterally   LUNGS: no acc muscle use, clear to A and P bilaterally without cough on insp or exp maneuvers   CV:  RRR  no s3 or murmur or increase in P2, no edema   ABD:  soft and nontender with nl excursion in the supine position. No bruits or organomegaly, bowel sounds nl  MS:  warm without deformities, calf tenderness, cyanosis or clubbing  SKIN: warm and dry without lesions    NEURO:  alert, approp, no deficits  03/29/13 cxr Atelectasis versus infiltrate at medial right lower lobe.  Remaining lungs clear.       Assessment & Plan:

## 2013-03-30 NOTE — Patient Instructions (Addendum)
The key to effective treatment for your cough is eliminating the non-stop cycle of cough you're stuck in long enough to let your airway heal completely and then see if there is anything still making you cough once you stop the cough suppression, but this should take no more than 5 days to figure out  First take delsym two tsp every 12 hours and supplement if needed with  tramadol 50 mg up to 2 every 4 hours to suppress the urge to cough. Swallowing water or using ice chips/non mint and menthol containing candies (such as lifesavers or sugarless jolly ranchers) are also effective.  You should rest your voice and avoid activities that you know make you cough.  Once you have eliminated the cough for 3 straight days try reducing the tramadol first,  then the delsym as tolerated.    Try prilosec 20mg   Take 30-60 min before first meal of the day and Pepcid 20 mg one bedtime until cough is completely gone for at least a week without the need for cough suppression  I think of reflux for chronic cough like I do oxygen for fire (doesn't cause the fire but once you get the oxygen suppressed it usually goes away regardless of the exact cause).  GERD (REFLUX)  is an extremely common cause of respiratory symptoms, many times with no significant heartburn at all.    It can be treated with medication, but also with lifestyle changes including avoidance of late meals, excessive alcohol, smoking cessation, and avoid fatty foods, chocolate, peppermint, colas, red wine, and acidic juices such as orange juice.  NO MINT OR MENTHOL PRODUCTS SO NO COUGH DROPS  USE SUGARLESS CANDY INSTEAD (jolley ranchers or Stover's)  NO OIL BASED VITAMINS - use powdered substitutes.    Prednisone 10 mg take  4 each am x 2 days,   2 each am x 2 days,  1 each am x2days and stop   Flutter valve  Please schedule a follow up office visit in 2 weeks, sooner if needed   Late add: With cxr on return and possible sinus ct next

## 2013-03-30 NOTE — Telephone Encounter (Signed)
Called her, she does not want to decrease the thyroid meds, she states she feels terrible when she decreases the meds, she just got the refilled. She asks if she can stay on this dose for a while longer. I advised this is a small decrease. New dose sent to [facility]. She wants labs repeated before the decrease in the meds. She is going to the pulmonologist today. She understood also that you can not use Tussionex until Trey Paula returns from out of town, he is out of town until Saturday.

## 2013-04-02 DIAGNOSIS — R9389 Abnormal findings on diagnostic imaging of other specified body structures: Secondary | ICD-10-CM | POA: Insufficient documentation

## 2013-04-02 DIAGNOSIS — R05 Cough: Secondary | ICD-10-CM | POA: Insufficient documentation

## 2013-04-02 NOTE — Assessment & Plan Note (Signed)
Not clear how much asthma is present here vs upper airway cough syndrome, discussed under cough dx

## 2013-04-02 NOTE — Assessment & Plan Note (Signed)
Cxr c/w ? rml syndrome developing already on abx and needs f/u cxr plus possible sinus ct in pt with possible mucociliary dysfunction issues.

## 2013-04-02 NOTE — Assessment & Plan Note (Signed)
The most common causes of chronic cough in immunocompetent adults include the following: upper airway cough syndrome (UACS), previously referred to as postnasal drip syndrome (PNDS), which is caused by variety of rhinosinus conditions; (2) asthma; (3) GERD; (4) chronic bronchitis from cigarette smoking or other inhaled environmental irritants; (5) nonasthmatic eosinophilic bronchitis; and (6) bronchiectasis.   These conditions, singly or in combination, have accounted for up to 94% of the causes of chronic cough in prospective studies.   Other conditions have constituted no >6% of the causes in prospective studies These have included bronchogenic carcinoma, chronic interstitial pneumonia, sarcoidosis, left ventricular failure, ACEI-induced cough, and aspiration from a condition associated with pharyngeal dysfunction.    Chronic cough is often simultaneously caused by more than one condition. A single cause has been found from 38 to 82% of the time, multiple causes from 18 to 62%. Multiply caused cough has been the result of three diseases up to 42% of the time.       Most likely this is  Classic Upper airway cough syndrome, so named because it's frequently impossible to sort out how much is  CR/sinusitis with freq throat clearing (which can be related to primary GERD)   vs  causing  secondary (" extra esophageal")  GERD from wide swings in gastric pressure that occur with throat clearing, often  promoting self use of mint and menthol lozenges that reduce the lower esophageal sphincter tone and exacerbate the problem further in a cyclical fashion.   These are the same pts (now being labeled as having "irritable larynx syndrome" by some cough centers) who not infrequently have a history of having failed to tolerate ace inhibitors,  dry powder inhalers or biphosphonates or report having atypical reflux symptoms that don't respond to standard doses of PPI , and are easily confused as having aecopd or asthma  flares by even experienced allergists/ pulmonologists.   rec max gerd rx and eliminate cyclical cough then regroup  See instructions for specific recommendations which were reviewed directly with the patient who was given a copy with highlighter outlining the key components.

## 2013-04-02 NOTE — Telephone Encounter (Signed)
Pt called wanted to let Dr Cleta Alberts know that her husband is home now and to please go ahead and  Call in RX for Tusonix.

## 2013-04-03 ENCOUNTER — Ambulatory Visit
Admission: RE | Admit: 2013-04-03 | Discharge: 2013-04-03 | Disposition: A | Discharge status: Home / Self Care | Payer: BC Managed Care – PPO | Source: Ambulatory Visit | Attending: Emergency Medicine | Admitting: Emergency Medicine

## 2013-04-03 DIAGNOSIS — E039 Hypothyroidism, unspecified: Secondary | ICD-10-CM

## 2013-04-03 MED ORDER — HYDROCOD POLST-CHLORPHEN POLST 10-8 MG/5ML PO LQCR
5.0000 mL | Freq: Two times a day (BID) | ORAL | Status: DC | PRN
Start: 2013-04-03 — End: 2013-04-21

## 2013-04-03 NOTE — Telephone Encounter (Signed)
Please call in a prescription for Tussionex. In the dosage 1 teaspoon twice a day. She can have a total of 60 cc which is 2 ounces.. Medication needs to be dispensed to her husband for his administration to her. She can have 1 refill on a medication which can be refilled in 6 days. She can only have this medication at home when her husband is there and he can administer it.

## 2013-04-03 NOTE — Telephone Encounter (Signed)
Please call in A prescription for Tussionex. She can have 1 teaspoon twice a day. She can have a total of 60 cc which is enough for 6 days. She can have one refill on the medication. Her husband Mr. Amariana Mirando will be in charge of her medication administration. The medication can be refilled in 6 days but no sooner. If Mr. Bilski needs to leave and be out of town for work he must take the medication with him .

## 2013-04-03 NOTE — Telephone Encounter (Signed)
Faxed for her, did you see first part of this message, Nikie is concerned another Dr is giving Trey Paula Tramadol, and she wants you to be aware. I did speak to [place]. He will get the cough meds for her, and administer it to her, and if he goes out of town, he will take this with her.

## 2013-04-03 NOTE — Telephone Encounter (Signed)
Michelle [name] called earlier and said she did not want to take the tramadol. she stated she was not going to get this filled and would wait until the weekend to get her Tussionex filled once Trey Paula was home. She should not be taking both .

## 2013-04-06 ENCOUNTER — Institutional Professional Consult (permissible substitution): Payer: BC Managed Care – PPO | Admitting: Internal Medicine

## 2013-04-07 ENCOUNTER — Other Ambulatory Visit: Payer: Self-pay | Admitting: Internal Medicine

## 2013-04-07 MED ORDER — FLUTTER DEVI: Status: DC

## 2013-04-11 ENCOUNTER — Other Ambulatory Visit: Payer: Self-pay | Admitting: Radiology

## 2013-04-11 DIAGNOSIS — E041 Nontoxic single thyroid nodule: Secondary | ICD-10-CM

## 2013-04-14 ENCOUNTER — Ambulatory Visit: Payer: BC Managed Care – PPO | Admitting: Internal Medicine

## 2013-04-21 ENCOUNTER — Emergency Department (HOSPITAL_COMMUNITY)
Admission: EM | Admit: 2013-04-21 | Discharge: 2013-04-21 | Disposition: A | Discharge status: Home / Self Care | Payer: BC Managed Care – PPO | Attending: Emergency Medicine | Admitting: Emergency Medicine

## 2013-04-21 ENCOUNTER — Encounter (HOSPITAL_COMMUNITY): Payer: Self-pay

## 2013-04-21 DIAGNOSIS — M949 Disorder of cartilage, unspecified: Secondary | ICD-10-CM | POA: Insufficient documentation

## 2013-04-21 DIAGNOSIS — R0602 Shortness of breath: Secondary | ICD-10-CM | POA: Insufficient documentation

## 2013-04-21 DIAGNOSIS — Y92009 Unspecified place in unspecified non-institutional (private) residence as the place of occurrence of the external cause: Secondary | ICD-10-CM | POA: Insufficient documentation

## 2013-04-21 DIAGNOSIS — Z888 Allergy status to other drugs, medicaments and biological substances status: Secondary | ICD-10-CM | POA: Insufficient documentation

## 2013-04-21 DIAGNOSIS — K219 Gastro-esophageal reflux disease without esophagitis: Secondary | ICD-10-CM | POA: Insufficient documentation

## 2013-04-21 DIAGNOSIS — E039 Hypothyroidism, unspecified: Secondary | ICD-10-CM | POA: Insufficient documentation

## 2013-04-21 DIAGNOSIS — E7800 Pure hypercholesterolemia, unspecified: Secondary | ICD-10-CM | POA: Insufficient documentation

## 2013-04-21 DIAGNOSIS — Z8679 Personal history of other diseases of the circulatory system: Secondary | ICD-10-CM | POA: Insufficient documentation

## 2013-04-21 DIAGNOSIS — Z8719 Personal history of other diseases of the digestive system: Secondary | ICD-10-CM | POA: Insufficient documentation

## 2013-04-21 DIAGNOSIS — Z881 Allergy status to other antibiotic agents status: Secondary | ICD-10-CM | POA: Insufficient documentation

## 2013-04-21 DIAGNOSIS — M1990 Unspecified osteoarthritis, unspecified site: Secondary | ICD-10-CM | POA: Insufficient documentation

## 2013-04-21 DIAGNOSIS — T6591XA Toxic effect of unspecified substance, accidental (unintentional), initial encounter: Secondary | ICD-10-CM | POA: Insufficient documentation

## 2013-04-21 DIAGNOSIS — T7840XA Allergy, unspecified, initial encounter: Secondary | ICD-10-CM | POA: Insufficient documentation

## 2013-04-21 DIAGNOSIS — J45909 Unspecified asthma, uncomplicated: Secondary | ICD-10-CM | POA: Insufficient documentation

## 2013-04-21 DIAGNOSIS — I10 Essential (primary) hypertension: Secondary | ICD-10-CM | POA: Insufficient documentation

## 2013-04-21 DIAGNOSIS — Z7982 Long term (current) use of aspirin: Secondary | ICD-10-CM | POA: Insufficient documentation

## 2013-04-21 DIAGNOSIS — Y93H9 Activity, other involving exterior property and land maintenance, building and construction: Secondary | ICD-10-CM | POA: Insufficient documentation

## 2013-04-21 DIAGNOSIS — Z882 Allergy status to sulfonamides status: Secondary | ICD-10-CM | POA: Insufficient documentation

## 2013-04-21 DIAGNOSIS — Z85850 Personal history of malignant neoplasm of thyroid: Secondary | ICD-10-CM | POA: Insufficient documentation

## 2013-04-21 DIAGNOSIS — J385 Laryngeal spasm: Secondary | ICD-10-CM | POA: Insufficient documentation

## 2013-04-21 DIAGNOSIS — M899 Disorder of bone, unspecified: Secondary | ICD-10-CM | POA: Insufficient documentation

## 2013-04-21 DIAGNOSIS — R569 Unspecified convulsions: Secondary | ICD-10-CM | POA: Insufficient documentation

## 2013-04-21 DIAGNOSIS — F3289 Other specified depressive episodes: Secondary | ICD-10-CM | POA: Insufficient documentation

## 2013-04-21 DIAGNOSIS — Z79899 Other long term (current) drug therapy: Secondary | ICD-10-CM | POA: Insufficient documentation

## 2013-04-21 DIAGNOSIS — F329 Major depressive disorder, single episode, unspecified: Secondary | ICD-10-CM | POA: Insufficient documentation

## 2013-04-21 MED ORDER — DIPHENHYDRAMINE HCL 25 MG PO CAPS
50.0000 mg | ORAL_CAPSULE | Freq: Once | ORAL | Status: AC
  Administered 2013-04-21: 50 mg via ORAL
  Filled 2013-04-21: qty 2

## 2013-04-21 MED ORDER — PREDNISONE 20 MG PO TABS
60.0000 mg | ORAL_TABLET | Freq: Once | ORAL | Status: AC
  Administered 2013-04-21: 60 mg via ORAL
  Filled 2013-04-21: qty 3

## 2013-04-21 MED ORDER — EPINEPHRINE 0.3 MG/0.3ML IJ SOAJ: 0.3000 mg | Freq: Once | INTRAMUSCULAR | Status: DC

## 2013-04-21 NOTE — ED Provider Notes (Signed)
History     CSN: [id number]  Arrival date & time 04/21/13  1302   First MD Initiated Contact with Patient 04/21/13 1308      No chief complaint on file.   (Consider location/radiation/quality/duration/timing/severity/associated sxs/prior treatment) HPI  52 year old female with a long list of allergies, hx of migraine, presents for evaluations of allergic reaction.patient reports she was working on her house today (recently moved in 3 weeks ago), she was pulling down wallpaper and noticed black mold. 15 minutes after she developed "laryngeal spasm" which is similar  allergic reaction that she had with hand sanitizer in the past. She immediately called EMS, and proceed to inject her self twice with two separate 0.3mg  epi-pen.  EMS arrived, no specific treatment given, patient felt better once arrived to the ER. She denies fever, tongue swelling, rash, hives, or chest pain. No other complaints.  Past Medical History  Diagnosis Date  . Hypercholesteremia   . Reflux   . Gastroparesis   . VAIN I (vaginal intraepithelial neoplasia grade I) 09/2012     colposcopic biopsies 02/2012 VAIN !  . GERD (gastroesophageal reflux disease)   . Hypertension   . Osteopenia 05/2009    -1.9  . Hypothyroidism   . Thyroid cancer     papillary  . PONV (postoperative nausea and vomiting)   . Osteoarthritis   . Asthma   . Gastroparesis     history of  . Headache(784.0)     migraines  . Seizures     1 seizure secondary to wellbutrin-none since  . Allergy   . Anaphylaxis   . Depression     Past Surgical History  Procedure Laterality Date  . Cosmetic surgery      buttock liposuction  . Thyroid surgery      for thyroid cancer  . Nissen fundoplication  2004, 2012    REFLUX  . Hernia repair  2004, 2012  . Breast surgery      reduc tion mammoplasty  . Cholecystectomy    . Upper gastrointestinal endoscopy  08/26/2004  . Varicose vein surgery  03/20/2003    right leg  . Colposcopy  02/2012     VAIN I  . Vaginal hysterectomy  2007    irregular bleeding    Family History  Problem Relation Age of Onset  . Heart disease Mother   . Hypertension Mother   . Heart disease Father   . Hypertension Father   . Emphysema Father 10    was a smoker  . Heart disease Sister   . Hypertension Sister   . Diabetes Paternal Grandmother   . Cancer Other   . Cancer Maternal Aunt     leukemia  . Cancer Maternal Grandmother   . Heart disease Brother   . Leukemia Maternal Grandfather     History  Substance Use Topics  . Smoking status: Never Smoker   . Smokeless tobacco: Never Used  . Alcohol Use: No    OB History   Grav Para Term Preterm Abortions TAB SAB Ect Mult Living   1 1        1       Review of Systems  Constitutional: Negative for fever.  Respiratory: Positive for shortness of breath. Negative for wheezing and stridor.   Skin: Negative for rash.  Neurological: Negative for headaches.    Allergies  Nutritional supplements; Pneumococcal vaccines; Tetanus toxoids; Wellbutrin; Bactrim; Clarithromycin; Doxycycline; Hydone; Ranitidine; Reglan; Sulfa antibiotics; and Tetracyclines & related  Home Medications  Current Outpatient Rx  Name  Route  Sig  Dispense  Refill  . Acyclovir-Lidocaine HCl 4-4 % OINT   Apply externally   Apply 1 application topically every 4 (four) hours while awake. As needed         . ALPRAZolam (XANAX) 1 MG tablet   Oral   Take 1 tablet (1 mg total) by mouth at bedtime. Patient may take one to one and 1/2 at bedtime prn may refill on schedule   9 tablet   5   . aspirin EC 81 MG tablet   Oral   Take 81 mg by mouth daily.           . cefdinir (OMNICEF) 300 MG capsule   Oral   Take 1 capsule (300 mg total) by mouth 2 (two) times daily.   20 capsule   0   . cetirizine (ZYRTEC) 10 MG tablet      TAKE ONE TABLET BY MOUTH ONE TIME DAILY   30 tablet   9   . chlorpheniramine-HYDROcodone (TUSSIONEX PENNKINETIC ER) 10-8 MG/5ML LQCR    Oral   Take 5 mLs by mouth every 12 (twelve) hours as needed.   60 mL   0   . EPINEPHrine (EPI-PEN) 0.3 mg/0.3 mL DEVI   Intramuscular   Inject 0.3 mg into the muscle as needed. For allergic reactions         . ezetimibe (ZETIA) 10 MG tablet   Oral   Take 10 mg by mouth daily.         . lansoprazole (PREVACID SOLUTAB) 30 MG disintegrating tablet      Take 30- 60 min before your first and last meals of the day   60 tablet   1   . levalbuterol (XOPENEX HFA) 45 MCG/ACT inhaler   Inhalation   Inhale 1-2 puffs into the lungs every 4 (four) hours as needed for wheezing.   2 Inhaler   3   . levothyroxine (SYNTHROID, LEVOTHROID) 88 MCG tablet   Oral   Take 1 tablet (88 mcg total) by mouth daily.   30 tablet   5   . metoprolol succinate (TOPROL-XL) 25 MG 24 hr tablet      TAKE ONE TABLET BY MOUTH ONE TIME DAILY   30 tablet   5   . ondansetron (ZOFRAN-ODT) 8 MG disintegrating tablet   Oral   Take 8 mg by mouth every 8 (eight) hours as needed. Nausea         . predniSONE (STERAPRED UNI-PAK) 10 MG tablet      Prednisone 10 mg take  4 each am x 2 days,   2 each am x 2 days,  1 each am x2days and stop   14 tablet   0   . Respiratory Therapy Supplies (FLUTTER) DEVI      Use as directed   1 each   0   . Respiratory Therapy Supplies (FLUTTER) DEVI      Use as directed   1 each   0   . rosuvastatin (CRESTOR) 20 MG tablet   Oral   Take 20 mg by mouth daily.         . sucralfate (CARAFATE) 1 GM/10ML suspension               . traMADol (ULTRAM) 50 MG tablet      1-2 every 4 hours as needed for cough or pain   40 tablet   0   .  travoprost, benzalkonium, (TRAVATAN) 0.004 % ophthalmic solution   Both Eyes   Place 1 drop into both eyes at bedtime.          . valACYclovir (VALTREX) 500 MG tablet   Oral   Take 500 mg by mouth 2 (two) times daily as needed. For fever blisters         . vitamin B-12 (CYANOCOBALAMIN) 1000 MCG tablet   Oral   Take  1,000 mcg by mouth daily.             LMP 05/23/2006  Physical Exam  Nursing note and vitals reviewed. Constitutional: She is oriented to person, place, and time. She appears well-developed and well-nourished. No distress.  HENT:  Head: Atraumatic.  Mouth/Throat: Oropharynx is clear and moist.  No mucosal edema, no tongue swelling, no stridor, no airway compromise  Eyes: Conjunctivae are normal.  Neck: Normal range of motion. Neck supple. No JVD present. No tracheal deviation present.  Cardiovascular: Normal rate and regular rhythm.   Pulmonary/Chest: Effort normal and breath sounds normal.  Abdominal: Soft.  Musculoskeletal: She exhibits no edema.  Neurological: She is alert and oriented to person, place, and time.  Skin: Skin is warm. No rash noted.  No hives or rash  Psychiatric: She has a normal mood and affect.    ED Course  Procedures (including critical care time)   Date: 04/21/2013  Rate: 110  Rhythm: normal sinus rhythm  QRS Axis: normal  Intervals: normal  ST/T Wave abnormalities: normal  Conduction Disutrbances: none  Narrative Interpretation:   Old EKG Reviewed: No significant changes noted     1:20 PM Pt presents with suspected allergic reaction to "black mold".  Incident happened 1-2 hrs ago.  Pt is back to her normal baseline.  No evidence of airway obstruction. Will give benadryl/prednisone and will continue to monitor.  Care discussed with attending.    2:37 PM Patient is comfortable on reevaluation. No new symptoms. Pt has tenderness to the site of her recent epi pain injection however there is no evidence of any overlying skin changes. Ice pack applied.  3:20 PM Pt remains asymptomatic, stable for discharge.  Will refill epi-pen prescription.  Pt to f/u with PCP. Pt plan to stay with her sister until the mold problem can be fixed.    Labs Reviewed - No data to display No results found.   1. Allergic reaction, initial encounter       MDM   BP 133/94  Pulse 103  Temp(Src) 98.2 F (36.8 C) (Oral)  Resp 20  SpO2 96%  LMP 05/23/2006         Fayrene Helper, PA-C 04/21/13 1540

## 2013-04-21 NOTE — ED Notes (Signed)
PA at bedside. [place] 

## 2013-04-21 NOTE — ED Provider Notes (Signed)
Medical screening examination/treatment/procedure(s) were performed by non-physician practitioner and as supervising physician I was immediately available for consultation/collaboration.   Joya Gaskins, MD 04/21/13 [phone]

## 2013-04-21 NOTE — ED Notes (Signed)
Per EMS: pt states she had an allergic reaction today, hx of multiple allergies. Pt states she just moved into a house 3 weeks ago and noticed black mold so she thinks she may be allergic to that. Pt complained of chest tightness, nausea, dizziness, and abd cramps, laryngeal tighness. Per ems,  Lung sounds clear and equal. Upon EMS arrival pt states she administered 2 0.3 mg EPI pens.

## 2013-04-22 ENCOUNTER — Telehealth: Payer: Self-pay | Admitting: Emergency Medicine

## 2013-04-22 ENCOUNTER — Other Ambulatory Visit: Payer: Self-pay | Admitting: Emergency Medicine

## 2013-04-22 NOTE — Telephone Encounter (Signed)
Call be sure improving. Return  to our clinic if symptoms persist.Avoid all mold contact

## 2013-04-22 NOTE — Telephone Encounter (Signed)
Spoke with patient and states she is staying at hotel. So she is improving.

## 2013-04-24 ENCOUNTER — Telehealth: Payer: Self-pay

## 2013-04-24 NOTE — Telephone Encounter (Signed)
PT STATES SHE IS HAVING SOME GASTRO PROBLEMS AND ALSO SOME JELLO POOP IS COMING OUT OF HER AND IT ISN'T HER DIET PLEASE CALL [phone]   [facility] AT [phone]    [phone]

## 2013-04-24 NOTE — Telephone Encounter (Signed)
Dr Cleta Alberts, I wasn't sure if this was Rxd for an acute illness or if you want to RF. I couldn't find recent notes on this med.

## 2013-04-25 ENCOUNTER — Telehealth: Payer: Self-pay | Admitting: Emergency Medicine

## 2013-04-25 NOTE — Telephone Encounter (Signed)
Please call check on status. The patient had called for a Phenergan refill. Be sure she is improving from her recent ER visit.

## 2013-04-25 NOTE — Telephone Encounter (Signed)
Called, not sure what this message indicates. She indicates her bowel movements are green now. And she is concerned about this. She did call Dr Elnoria Howard and he was not concerned about this at all. Patient was advised the phenergan was sent in for her.

## 2013-04-25 NOTE — Telephone Encounter (Signed)
Patient advised.

## 2013-04-28 ENCOUNTER — Encounter (HOSPITAL_COMMUNITY): Payer: Self-pay | Admitting: *Deleted

## 2013-04-28 ENCOUNTER — Emergency Department (HOSPITAL_COMMUNITY)
Admission: EM | Admit: 2013-04-28 | Discharge: 2013-04-28 | Disposition: A | Discharge status: Home / Self Care | Payer: BC Managed Care – PPO | Attending: Emergency Medicine | Admitting: Emergency Medicine

## 2013-04-28 DIAGNOSIS — Z85850 Personal history of malignant neoplasm of thyroid: Secondary | ICD-10-CM | POA: Insufficient documentation

## 2013-04-28 DIAGNOSIS — F411 Generalized anxiety disorder: Secondary | ICD-10-CM | POA: Insufficient documentation

## 2013-04-28 DIAGNOSIS — R0600 Dyspnea, unspecified: Secondary | ICD-10-CM

## 2013-04-28 DIAGNOSIS — Z79899 Other long term (current) drug therapy: Secondary | ICD-10-CM | POA: Insufficient documentation

## 2013-04-28 DIAGNOSIS — Z7982 Long term (current) use of aspirin: Secondary | ICD-10-CM | POA: Insufficient documentation

## 2013-04-28 DIAGNOSIS — L299 Pruritus, unspecified: Secondary | ICD-10-CM | POA: Insufficient documentation

## 2013-04-28 DIAGNOSIS — M899 Disorder of bone, unspecified: Secondary | ICD-10-CM | POA: Insufficient documentation

## 2013-04-28 DIAGNOSIS — G43909 Migraine, unspecified, not intractable, without status migrainosus: Secondary | ICD-10-CM | POA: Insufficient documentation

## 2013-04-28 DIAGNOSIS — F3289 Other specified depressive episodes: Secondary | ICD-10-CM | POA: Insufficient documentation

## 2013-04-28 DIAGNOSIS — R490 Dysphonia: Secondary | ICD-10-CM | POA: Insufficient documentation

## 2013-04-28 DIAGNOSIS — I10 Essential (primary) hypertension: Secondary | ICD-10-CM | POA: Insufficient documentation

## 2013-04-28 DIAGNOSIS — Z8719 Personal history of other diseases of the digestive system: Secondary | ICD-10-CM | POA: Insufficient documentation

## 2013-04-28 DIAGNOSIS — F329 Major depressive disorder, single episode, unspecified: Secondary | ICD-10-CM | POA: Insufficient documentation

## 2013-04-28 DIAGNOSIS — E039 Hypothyroidism, unspecified: Secondary | ICD-10-CM | POA: Insufficient documentation

## 2013-04-28 DIAGNOSIS — Z87411 Personal history of vaginal dysplasia: Secondary | ICD-10-CM | POA: Insufficient documentation

## 2013-04-28 DIAGNOSIS — M1990 Unspecified osteoarthritis, unspecified site: Secondary | ICD-10-CM | POA: Insufficient documentation

## 2013-04-28 DIAGNOSIS — Z8669 Personal history of other diseases of the nervous system and sense organs: Secondary | ICD-10-CM | POA: Insufficient documentation

## 2013-04-28 DIAGNOSIS — T7840XA Allergy, unspecified, initial encounter: Secondary | ICD-10-CM

## 2013-04-28 DIAGNOSIS — M949 Disorder of cartilage, unspecified: Secondary | ICD-10-CM | POA: Insufficient documentation

## 2013-04-28 DIAGNOSIS — E7800 Pure hypercholesterolemia, unspecified: Secondary | ICD-10-CM | POA: Insufficient documentation

## 2013-04-28 DIAGNOSIS — J45901 Unspecified asthma with (acute) exacerbation: Secondary | ICD-10-CM | POA: Insufficient documentation

## 2013-04-28 DIAGNOSIS — K219 Gastro-esophageal reflux disease without esophagitis: Secondary | ICD-10-CM | POA: Insufficient documentation

## 2013-04-28 MED ORDER — FAMOTIDINE 20 MG PO TABS
40.0000 mg | ORAL_TABLET | Freq: Once | ORAL | Status: DC
  Filled 2013-04-28: qty 2

## 2013-04-28 MED ORDER — SODIUM CHLORIDE 0.9 % IV BOLUS (SEPSIS)
1000.0000 mL | Freq: Once | INTRAVENOUS | Status: AC
  Administered 2013-04-28: 1000 mL via INTRAVENOUS

## 2013-04-28 MED ORDER — DEXAMETHASONE SODIUM PHOSPHATE 10 MG/ML IJ SOLN
20.0000 mg | Freq: Once | INTRAMUSCULAR | Status: AC
  Administered 2013-04-28: 20 mg via INTRAVENOUS
  Filled 2013-04-28: qty 2

## 2013-04-28 MED ORDER — EPINEPHRINE 0.3 MG/0.3ML IJ SOAJ: 0.3000 mg | INTRAMUSCULAR | Status: DC | PRN

## 2013-04-28 NOTE — ED Provider Notes (Signed)
History     CSN: [id number]  Arrival date & time 04/28/13  1601   First MD Initiated Contact with Patient 04/28/13 1611      Chief Complaint  Patient presents with  . Allergic Reaction    (Consider location/radiation/quality/duration/timing/severity/associated sxs/prior treatment) Patient is a 52 y.o. female presenting with allergic reaction.  Allergic Reaction Presenting symptoms: difficulty breathing and itching   Presenting symptoms: no difficulty swallowing and no rash   Severity:  Moderate Prior episodes: mold, strong fragrances. Context: no food allergies, no medications and no new detergents/soaps   Context comment:  Moldy closet Relieved by:  Antihistamines and epinephrine Worsened by:  Nothing tried   Past Medical History  Diagnosis Date  . Hypercholesteremia   . Reflux   . Gastroparesis   . VAIN I (vaginal intraepithelial neoplasia grade I) 09/2012     colposcopic biopsies 02/2012 VAIN !  . GERD (gastroesophageal reflux disease)   . Hypertension   . Osteopenia 05/2009    -1.9  . Hypothyroidism   . Thyroid cancer     papillary  . PONV (postoperative nausea and vomiting)   . Osteoarthritis   . Asthma   . Gastroparesis     history of  . Headache(784.0)     migraines  . Seizures     1 seizure secondary to wellbutrin-none since  . Allergy   . Anaphylaxis   . Depression     Past Surgical History  Procedure Laterality Date  . Cosmetic surgery      buttock liposuction  . Thyroid surgery      for thyroid cancer  . Nissen fundoplication  2004, 2012    REFLUX  . Hernia repair  2004, 2012  . Breast surgery      reduc tion mammoplasty  . Cholecystectomy    . Upper gastrointestinal endoscopy  08/26/2004  . Varicose vein surgery  03/20/2003    right leg  . Colposcopy  02/2012    VAIN I  . Vaginal hysterectomy  2007    irregular bleeding    Family History  Problem Relation Age of Onset  . Heart disease Mother   . Hypertension Mother   . Heart  disease Father   . Hypertension Father   . Emphysema Father 29    was a smoker  . Heart disease Sister   . Hypertension Sister   . Diabetes Paternal Grandmother   . Cancer Other   . Cancer Maternal Aunt     leukemia  . Cancer Maternal Grandmother   . Heart disease Brother   . Leukemia Maternal Grandfather     History  Substance Use Topics  . Smoking status: Never Smoker   . Smokeless tobacco: Never Used  . Alcohol Use: No    OB History   Grav Para Term Preterm Abortions TAB SAB Ect Mult Living   1 1        1       Review of Systems  Constitutional: Negative for fever and chills.  HENT: Negative for congestion, sore throat, rhinorrhea and trouble swallowing.   Eyes: Negative for photophobia and visual disturbance.  Respiratory: Negative for cough and shortness of breath.   Cardiovascular: Negative for chest pain and leg swelling.  Gastrointestinal: Negative for nausea, vomiting, abdominal pain, diarrhea and constipation.  Endocrine: Negative for polyphagia and polyuria.  Genitourinary: Negative for dysuria, flank pain, vaginal bleeding, vaginal discharge and enuresis.  Musculoskeletal: Negative for back pain and gait problem.  Skin: Positive for itching.  Negative for color change and rash.  Neurological: Negative for dizziness, syncope, light-headedness and numbness.  Hematological: Negative for adenopathy. Does not bruise/bleed easily.  All other systems reviewed and are negative.    Allergies  Lunesta; Nutritional supplements; Pneumococcal vaccines; Tetanus toxoids; Wellbutrin; Zolpidem; Bactrim; Clarithromycin; Doxycycline; Hydone; Ranitidine; Reglan; Sulfa antibiotics; and Tetracyclines & related  Home Medications   Current Outpatient Rx  Name  Route  Sig  Dispense  Refill  . ALPRAZolam (XANAX) 1 MG tablet   Oral   Take 1 tablet (1 mg total) by mouth at bedtime. Patient may take one to one and 1/2 at bedtime prn may refill on schedule   9 tablet   5   .  aspirin EC 81 MG tablet   Oral   Take 81 mg by mouth daily.           . cetirizine (ZYRTEC) 10 MG tablet   Oral   Take 10 mg by mouth at bedtime.          . chlorpheniramine-HYDROcodone (TUSSIONEX) 10-8 MG/5ML LQCR   Oral   Take 5 mLs by mouth every 12 (twelve) hours as needed (for cough).         . diclofenac sodium (VOLTAREN) 1 % GEL   Topical   Apply 2 g topically 4 (four) times daily.         Marland Kitchen EPINEPHrine (EPIPEN) 0.3 mg/0.3 mL DEVI   Intramuscular   Inject 0.3 mLs (0.3 mg total) into the muscle once.   1 Device   1   . estradiol (VIVELLE-DOT) 0.05 MG/24HR   Transdermal   Place 1 patch onto the skin 2 (two) times a week.         . ezetimibe (ZETIA) 10 MG tablet   Oral   Take 10 mg by mouth daily.         . lansoprazole (PREVACID SOLUTAB) 30 MG disintegrating tablet   Oral   Take 30 mg by mouth daily as needed (for stomach problems). Take 30- 60 min before your first and last meals of the day         . levalbuterol (XOPENEX HFA) 45 MCG/ACT inhaler   Inhalation   Inhale 1-2 puffs into the lungs every 4 (four) hours as needed for wheezing.   2 Inhaler   3   . levothyroxine (SYNTHROID, LEVOTHROID) 100 MCG tablet   Oral   Take 100 mcg by mouth daily before breakfast.         . metoprolol succinate (TOPROL-XL) 25 MG 24 hr tablet   Oral   Take 25 mg by mouth daily.         . ondansetron (ZOFRAN-ODT) 8 MG disintegrating tablet   Oral   Take 16 mg by mouth every 8 (eight) hours as needed for nausea.          . promethazine (PHENERGAN) 25 MG tablet   Oral   Take 25 mg by mouth every 6 (six) hours as needed for nausea.         . rosuvastatin (CRESTOR) 20 MG tablet   Oral   Take 20 mg by mouth daily.         . travoprost, benzalkonium, (TRAVATAN) 0.004 % ophthalmic solution   Both Eyes   Place 1 drop into both eyes at bedtime.          . valACYclovir (VALTREX) 500 MG tablet   Oral   Take 500 mg by mouth 2 (two) times daily  as  needed. For fever blisters         . vitamin B-12 (CYANOCOBALAMIN) 1000 MCG tablet   Oral   Take 1,000 mcg by mouth daily.           Marland Kitchen EPINEPHrine (EPIPEN) 0.3 mg/0.3 mL DEVI   Intramuscular   Inject 0.3 mLs (0.3 mg total) into the muscle as needed (suspected allergic reaction).   1 Device   0     BP 143/93  Pulse 93  Temp(Src) 97.9 F (36.6 C) (Oral)  Resp 18  SpO2 100%  LMP 05/23/2006  Physical Exam  Vitals reviewed. Constitutional: She is oriented to person, place, and time. She appears well-developed and well-nourished.  HENT:  Head: Normocephalic and atraumatic.  Right Ear: External ear normal.  Left Ear: External ear normal.  Eyes: Conjunctivae and EOM are normal. Pupils are equal, round, and reactive to light.  Neck: Normal range of motion. Neck supple.  Cardiovascular: Normal rate, regular rhythm, normal heart sounds and intact distal pulses.   Pulmonary/Chest: Effort normal and breath sounds normal.  Abdominal: Soft. Bowel sounds are normal.  Musculoskeletal: Normal range of motion.  Neurological: She is alert and oriented to person, place, and time.  Skin: Skin is warm and dry.    ED Course  Procedures (including critical care time)  Labs Reviewed - No data to display No results found.   1. Hoarse voice quality   2. Itching   3. Dyspnea   4. Allergic reaction, initial encounter       MDM  51 y.o. female  with pertinent PMH of multiple allergies presents with possible allergic reaction to mold from closet 1 hour prior to arrival.  Pt took 50mg  benadryl and epipen prior to arrival.  Vitals as above on arrival. Physical exam with clear lungs, no urticaria.  Unknown if true anaphylaxis given prompt treatment.  Will give H2 blocker, decadron, monitor for 2-3 hours.    Pt without worsening reaction.  Unlikely true anaphylactic reaction given nature of symptoms, however as above unable to truly determine if pt was to have suspected anaphylaxis.   Exposure minimal, so do not feel that she is at risk of biphasic reaction given clinical scenario.  Stable to dc home with PCP fu.  Given standard return precautions, voiced understanding, and agreed to fu.   Labs and imaging as above reviewed by myself and attending,Dr. Lynelle Doctor, with whom case was discussed.   1. Hoarse voice quality   2. Itching   3. Dyspnea   4. Allergic reaction, initial encounter             Noel Gerold, MD 04/28/13 [phone]

## 2013-04-28 NOTE — ED Notes (Signed)
Pt reports having allergic reaction to mold, etc despite having a mask on, pt reports laryngeal spasms and used her epi pen pta. Reports relief of her symptoms, now only filling jittery. Airway intact, HR 111.

## 2013-04-28 NOTE — ED Notes (Signed)
Pt discharged to home with family. NAD.  

## 2013-04-28 NOTE — ED Provider Notes (Signed)
I saw and evaluated the patient, reviewed the resident's note and I agree with the findings and plan.  The patient presented to the emergency room after having a reaction to mold. Patient apparently was cleaning a closet when she started to feel scratching in her throat as well as the sensation that her throat was closing up.  Patient gave herself an EpiPen at home. She is feeling better now and just jittery. On exam she is able to speak in full sentences. She has no evidence of swelling. She has no rashes.  Plan will be to monitor in the emergency department.  She has outpatient followup with an allergist.  Celene Kras, MD 04/28/13 [phone]

## 2013-04-29 ENCOUNTER — Telehealth: Payer: Self-pay | Admitting: Emergency Medicine

## 2013-04-29 NOTE — Telephone Encounter (Signed)
I received an e-mail this morning the patient had been to the emergency room yesterday for treatment of respiratory difficulty while working at her her home. She gave herself an epinephrine at home and presented to the emergency room and was treated there with IV Decadron. Her symptoms are much improved today. Please call the patient and advise she should not be involved in remodeling of her new home or doing extensive cleaning where she would be reexposed to mold or mildew.
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
## 2013-05-01 ENCOUNTER — Telehealth: Payer: Self-pay

## 2013-05-01 NOTE — Telephone Encounter (Signed)
Ok to refill She gets a refill every week for 6 weeks.The pharmacist is aware.

## 2013-05-01 NOTE — Telephone Encounter (Signed)
Pharm reqs RF of alprazolam 1 mg

## 2013-05-01 NOTE — Telephone Encounter (Signed)
Called in, she has 4 refills on last rx this was done.

## 2013-05-23 ENCOUNTER — Other Ambulatory Visit: Payer: Self-pay | Admitting: Emergency Medicine

## 2013-05-23 DIAGNOSIS — C73 Malignant neoplasm of thyroid gland: Secondary | ICD-10-CM

## 2013-05-31 ENCOUNTER — Other Ambulatory Visit (HOSPITAL_COMMUNITY)
Admission: RE | Admit: 2013-05-31 | Discharge: 2013-05-31 | Disposition: A | Discharge status: Home / Self Care | Payer: BC Managed Care – PPO | Source: Ambulatory Visit | Attending: Gynecology | Admitting: Gynecology

## 2013-05-31 ENCOUNTER — Ambulatory Visit (INDEPENDENT_AMBULATORY_CARE_PROVIDER_SITE_OTHER): Payer: BC Managed Care – PPO | Admitting: Gynecology

## 2013-05-31 ENCOUNTER — Encounter: Payer: Self-pay | Admitting: Gynecology

## 2013-05-31 VITALS — BP 126/82 | Ht 63.0 in

## 2013-05-31 DIAGNOSIS — Z01419 Encounter for gynecological examination (general) (routine) without abnormal findings: Secondary | ICD-10-CM | POA: Insufficient documentation

## 2013-05-31 DIAGNOSIS — M899 Disorder of bone, unspecified: Secondary | ICD-10-CM

## 2013-05-31 DIAGNOSIS — M8580 Other specified disorders of bone density and structure, unspecified site: Secondary | ICD-10-CM

## 2013-05-31 DIAGNOSIS — R87810 Cervical high risk human papillomavirus (HPV) DNA test positive: Secondary | ICD-10-CM | POA: Insufficient documentation

## 2013-05-31 DIAGNOSIS — Z1151 Encounter for screening for human papillomavirus (HPV): Secondary | ICD-10-CM | POA: Insufficient documentation

## 2013-05-31 DIAGNOSIS — N893 Dysplasia of vagina, unspecified: Secondary | ICD-10-CM

## 2013-05-31 DIAGNOSIS — Z79890 Hormone replacement therapy (postmenopausal): Secondary | ICD-10-CM

## 2013-05-31 DIAGNOSIS — N890 Mild vaginal dysplasia: Secondary | ICD-10-CM

## 2013-05-31 NOTE — Patient Instructions (Signed)
Call in 2 weeks in followup of your new estrogen patch. Otherwise followup in one year for annual exam.

## 2013-05-31 NOTE — Progress Notes (Signed)
Michelle [name] 1961-11-06 [id number]        52 y.o.  G1P1 for annual exam.  Several issues noted below.  Past medical history,surgical history, medications, allergies, family history and social history were all reviewed and documented in the EPIC chart.  ROS:  Performed and pertinent positives and negatives are included in the history, assessment and plan .  Exam: Biomedical scientist Filed Vitals:   05/31/13 1401  BP: 126/82  Height: 5\' 3"  (1.6 m)   General appearance  Normal Skin grossly normal Head/Neck normal with no cervical or supraclavicular adenopathy thyroid normal Lungs  clear Cardiac RR, without RMG Abdominal  soft, nontender, without masses, organomegaly or hernia Breasts  examined lying and sitting without masses, retractions, discharge or axillary adenopathy. Pelvic  Ext/BUS/vagina  normal Pap/HPV  Adnexa  Without masses or tenderness    Anus and perineum  normal   Rectovaginal  normal sphincter tone without palpated masses or tenderness.    Assessment/Plan:  52 y.o. G1P1 female for annual exam.   1. HRT. Patient on Vivelle 05 mg patch. Status post hysterectomy in the past. Still having hot flashes in fact noticed that they seem to be getting worse. Not sure of its thyroid versus estrogen related. Actively being followed for her thyroid.  I again reviewed the whole issue of HRT with her to include the WHI study with increased risk of stroke, heart attack, DVT and breast cancer. The ACOG and NAMS statements for lowest dose for the shortest period of time reviewed. I recommended that we try the next higher dose to see how she feels and she agrees with this. 2 sample weeks of 0.075 patches given. She will call at the end of the second week to see how she's doing and we'll refill her or consider even going to the 0.1 mg patch 2. VAIN I. History of persistent VAIN I positive high-risk HPV dating back to  2011. Colposcopy 02/2012 with biopsy showing low-grade changes. Followup Pap  smears most recently 09/2012 showed VAIN I. Pap/HPV done today. 3. Osteopenia. DEXA 05/2009 with T score -1.9. FRAX 4.3%/0.5%. Plan repeat DEXA now. Increase calcium vitamin D reviewed. Check vitamin D level today. 4. Mammography. December 2013. Continue with annual mammography. SBE monthly reviewed. 5. Colonoscopy. 7 years ago. Repeated their recommended interval. 6. Thyroid nodule. Left lobe. Not palpable. Actively being followed for this given her history of papillary carcinoma. She'll continue to followup with her other physicians for this. 7. Health maintenance. Actively sees Dr. Cleta Alberts for routine healthcare. No other blood work and vitamin D done today. Patient to call in 2 weeks in followup of her Vivelle patch adjustment.  Note: This document was prepared with digital dictation and possible smart phrase technology. Any transcriptional errors that result from this process are unintentional.   Dara Lords MD, 2:27 PM 05/31/2013

## 2013-06-05 ENCOUNTER — Telehealth: Payer: Self-pay

## 2013-06-05 MED ORDER — ALPRAZOLAM 1 MG PO TABS: 1.0000 mg | ORAL_TABLET | Freq: Every day | ORAL | Status: DC

## 2013-06-05 NOTE — Telephone Encounter (Signed)
Pharm reqs RF of alprazolam 1 mg

## 2013-06-05 NOTE — Telephone Encounter (Signed)
I have clearly documented this in the chart each time she calls, unsure why this was not done, I have phoned it in.

## 2013-06-05 NOTE — Telephone Encounter (Signed)
The patient can refill her medications on schedule as she has in the past. She is given #9 pills. She refills  each week. She can have as many refills as allowable.

## 2013-06-13 ENCOUNTER — Ambulatory Visit (INDEPENDENT_AMBULATORY_CARE_PROVIDER_SITE_OTHER): Payer: BC Managed Care – PPO | Admitting: Endocrinology

## 2013-06-13 ENCOUNTER — Encounter: Payer: Self-pay | Admitting: Endocrinology

## 2013-06-13 VITALS — BP 110/80 | HR 81 | Temp 98.0°F | Ht 64.0 in

## 2013-06-13 DIAGNOSIS — E039 Hypothyroidism, unspecified: Secondary | ICD-10-CM

## 2013-06-13 NOTE — Progress Notes (Signed)
Subjective:    Patient ID: Michelle [name], female    DOB: July 21, 1961, 52 y.o.   MRN: [id number]  HPI In 2004, pt had resection of thyroid isthmus and adjacent nodule.  A small focus of papillary carcinoma was found.  She has been followed with serial ultrasounds since then.   She has been on synthroid since then.  2 mos ago, TSH was low.  Dr Cleta Alberts advised her to reduce it, but she did not wish to reduce.  She reports alternating hot and cold intolerance, but no assoc pain at the anterior neck. Past Medical History  Diagnosis Date  . Hypercholesteremia   . Reflux   . Gastroparesis   . VAIN I (vaginal intraepithelial neoplasia grade I) 09/2012     colposcopic biopsies 02/2012 VAIN !  . GERD (gastroesophageal reflux disease)   . Hypertension   . Osteopenia 05/2009    -1.9  . Hypothyroidism   . Thyroid cancer     papillary  . PONV (postoperative nausea and vomiting)   . Osteoarthritis   . Asthma   . Gastroparesis     history of  . Headache(784.0)     migraines  . Seizures     1 seizure secondary to wellbutrin-none since  . Allergy   . Anaphylaxis   . Depression     Past Surgical History  Procedure Laterality Date  . Cosmetic surgery      buttock liposuction  . Thyroid surgery      for thyroid cancer  . Nissen fundoplication  2004, 2012    REFLUX  . Hernia repair  2004, 2012  . Breast surgery      reduc tion mammoplasty  . Cholecystectomy    . Upper gastrointestinal endoscopy  08/26/2004  . Varicose vein surgery  03/20/2003    right leg  . Colposcopy  02/2012    VAIN I  . Vaginal hysterectomy  2007    irregular bleeding    History   Social History  . Marital Status: Married    Spouse Name: N/A    Number of Children: N/A  . Years of Education: N/A   Occupational History  . Not on file.   Social History Main Topics  . Smoking status: Never Smoker   . Smokeless tobacco: Never Used  . Alcohol Use: No  . Drug Use: No  . Sexually Active: No   Other Topics  Concern  . Not on file   Social History Narrative   Married.m Education: [employer]. Exercise: Yes.    Current Outpatient Prescriptions on File Prior to Visit  Medication Sig Dispense Refill  . ALPRAZolam (XANAX) 1 MG tablet Take 1 tablet (1 mg total) by mouth at bedtime. Patient may take one to one and 1/2 at bedtime prn may refill on schedule  9 tablet  5  . aspirin EC 81 MG tablet Take 81 mg by mouth daily.        . cetirizine (ZYRTEC) 10 MG tablet Take 10 mg by mouth at bedtime.       . chlorpheniramine-HYDROcodone (TUSSIONEX) 10-8 MG/5ML LQCR Take 5 mLs by mouth every 12 (twelve) hours as needed (for cough).      . diclofenac sodium (VOLTAREN) 1 % GEL Apply 2 g topically 4 (four) times daily.      Marland Kitchen EPINEPHrine (EPIPEN) 0.3 mg/0.3 mL DEVI Inject 0.3 mLs (0.3 mg total) into the muscle once.  1 Device  1  . EPINEPHrine (EPIPEN) 0.3 mg/0.3 mL DEVI Inject  0.3 mLs (0.3 mg total) into the muscle as needed (suspected allergic reaction).  1 Device  0  . estradiol (VIVELLE-DOT) 0.05 MG/24HR Place 1 patch onto the skin 2 (two) times a week.      . ezetimibe (ZETIA) 10 MG tablet Take 10 mg by mouth daily.      . lansoprazole (PREVACID SOLUTAB) 30 MG disintegrating tablet Take 30 mg by mouth daily as needed (for stomach problems). Take 30- 60 min before your first and last meals of the day      . levalbuterol (XOPENEX HFA) 45 MCG/ACT inhaler Inhale 1-2 puffs into the lungs every 4 (four) hours as needed for wheezing.  2 Inhaler  3  . levothyroxine (SYNTHROID, LEVOTHROID) 100 MCG tablet Take 100 mcg by mouth daily before breakfast.      . metoprolol succinate (TOPROL-XL) 25 MG 24 hr tablet Take 25 mg by mouth daily.      . ondansetron (ZOFRAN-ODT) 8 MG disintegrating tablet Take 16 mg by mouth every 8 (eight) hours as needed for nausea.       . promethazine (PHENERGAN) 25 MG tablet Take 25 mg by mouth every 6 (six) hours as needed for nausea.      . rosuvastatin (CRESTOR) 20 MG tablet Take 20 mg by  mouth daily.      . travoprost, benzalkonium, (TRAVATAN) 0.004 % ophthalmic solution Place 1 drop into both eyes at bedtime.       . valACYclovir (VALTREX) 500 MG tablet Take 500 mg by mouth 2 (two) times daily as needed. For fever blisters      . vitamin B-12 (CYANOCOBALAMIN) 1000 MCG tablet Take 1,000 mcg by mouth daily.         No current facility-administered medications on file prior to visit.    Allergies  Allergen Reactions  . Lunesta (Eszopiclone) Other (See Comments)    Causes sleep walking events  . Nutritional Supplements Anaphylaxis  . Pneumococcal Vaccines Anaphylaxis  . Tetanus Toxoids Swelling  . Wellbutrin (Bupropion Hcl) Other (See Comments)    seizure  . Zolpidem     Causes sleep walking events  . Bactrim Hives and Other (See Comments)    Severe headache  . Clarithromycin Itching and Other (See Comments)    Severe headache  . Doxycycline Other (See Comments)    Severe GI upset  . Hydone (Chlorthalidone) Other (See Comments)    Vasculitis   . Ranitidine Other (See Comments)    Acts like a diuretic   . Reglan (Metoclopramide) Other (See Comments)    Tremors and ticks, possible permanence of effects  . Sulfa Antibiotics Hives and Other (See Comments)    Severe headache  . Tetracyclines & Related Other (See Comments)    Severe GI upset    Family History  Problem Relation Age of Onset  . Heart disease Mother   . Hypertension Mother   . Heart disease Father   . Hypertension Father   . Emphysema Father 43    was a smoker  . Heart disease Sister   . Hypertension Sister   . Diabetes Paternal Grandmother   . Cancer Other   . Cancer Maternal Aunt     leukemia  . Cancer Maternal Grandmother   . Heart disease Brother   . Leukemia Maternal Grandfather   no thyroid probs  BP 110/80  Pulse 81  Temp(Src) 98 F (36.7 C) (Oral)  Ht 5\' 4"  (1.626 m)  SpO2 95%  LMP 05/23/2006   Review  of Systems denies weight loss, headache, hoarseness, double vision,  palpitations, sob, polyuria, myalgias, excessive diaphoresis, numbness, tremor, hypoglycemia, easy bruising, and rhinorrhea.  She has chronic constipation, anxiety, and nausea.      Objective:   Physical Exam VS: see vs page GEN: no distress HEAD: head: no deformity eyes: no periorbital swelling, no proptosis external nose and ears are normal mouth: no lesion seen NECK: a healed scar is present.  i do not appreciate a nodule in the thyroid or elsewhere in the neck CHEST WALL: no deformity LUNGS:  Clear to auscultation CV: reg rate and rhythm, no murmur ABD: abdomen is soft, nontender.  no hepatosplenomegaly.  not distended.  no hernia MUSCULOSKELETAL: muscle bulk and strength are grossly normal.  no obvious joint swelling.  gait is normal and steady EXTEMITIES: no deformity.  no edema.   PULSES: no carotid bruit NEURO:  cn 2-12 grossly intact.   readily moves all 4's.  sensation is intact to touch on all 4's.   SKIN:  Normal texture and temperature.  No rash or suspicious lesion is visible.   NODES:  None palpable at the neck PSYCH: alert, oriented x3.  Does not appear anxious nor depressed.    (i reviewed 2004 thyroid pathology report) THYROID, ISTHMUS AND NODULE: PAPILLARY THYROID CARCINOMA, 0.5 CM IN GREATEST DIMENSION, pT1, pNX, pMX. (i also reviewed [place] report) Lab Results  Component Value Date   TSH 0.05* 06/13/2013      Assessment & Plan:  H/o very small focus of thyroid cancer, no f/u needed, except periodic ultrasound. Hypothyroidism, overreplaced Anxiety, possibly due to synthroid.

## 2013-06-13 NOTE — Patient Instructions (Addendum)
blood tests are being requested for you today.  We'll contact you with results. Our goal will be a normal blood test.   You should have the ultrasound repeated in 6-12 months.   I would be happy to see you back here whenever you want.

## 2013-06-14 ENCOUNTER — Telehealth: Payer: Self-pay | Admitting: *Deleted

## 2013-06-14 DIAGNOSIS — E039 Hypothyroidism, unspecified: Secondary | ICD-10-CM

## 2013-06-14 LAB — TSH: TSH: 0.05 u[IU]/mL — ABNORMAL LOW (ref 0.35–5.50)

## 2013-06-14 MED ORDER — LEVOTHYROXINE SODIUM 75 MCG PO TABS: 75.0000 ug | ORAL_TABLET | Freq: Every day | ORAL | Status: DC

## 2013-06-14 NOTE — Telephone Encounter (Signed)
i would be happy to prescribe the brand name, but it is not safe to continue this dosage.

## 2013-06-14 NOTE — Telephone Encounter (Signed)
Patient called upset because she received generic synthroid instead of name brand. Patient stated that she does not want to change to generic. Patient also wanted to know why synthroid changed from 100 mcg to 75 mcg? Patient stated that she does not do well on 75 mcg. Please advise?

## 2013-06-15 ENCOUNTER — Other Ambulatory Visit: Payer: Self-pay

## 2013-06-15 MED ORDER — LEVOTHYROXINE SODIUM 75 MCG PO TABS: 75.0000 ug | ORAL_TABLET | Freq: Every day | ORAL | Status: DC

## 2013-06-15 NOTE — Telephone Encounter (Signed)
i would be happy to check these blood tests if you wish. It is unsafe to take this much thyroid medication.  For your safety, please reduce it.

## 2013-06-15 NOTE — Telephone Encounter (Signed)
Pt states she will reduce med and would like brand name rx sent to pharmacy, also she does want to come in for lab test

## 2013-06-15 NOTE — Telephone Encounter (Signed)
Pt states she is concerned about changing the dosage, and states she doesn't respond well to med changes, would like to know why her t3 and t4 wasn't checked, please advise

## 2013-06-22 ENCOUNTER — Other Ambulatory Visit: Payer: Self-pay | Admitting: Emergency Medicine

## 2013-06-22 ENCOUNTER — Telehealth: Payer: Self-pay

## 2013-06-22 DIAGNOSIS — I10 Essential (primary) hypertension: Secondary | ICD-10-CM

## 2013-06-22 MED ORDER — METOPROLOL SUCCINATE ER 25 MG PO TB24: ORAL_TABLET | ORAL | Status: DC

## 2013-06-22 NOTE — Telephone Encounter (Signed)
Patient called stating her blood pressure has been elevated. I advised her to contact Dr. Everardo All regarding her Synthroid dosage. I advised her to take 2 Toprol-XL 25 mg a day until her blood pressure gets under control. I advised her to get a arm cuff to use for blood pressure taking .

## 2013-06-22 NOTE — Telephone Encounter (Signed)
PT STATES SHE WENT TO SEE DR ELLISON AND THE MEDICINE FOR HER SYNTHROID HAS MADE HER BP GO UP HIGHER. DIDN'T KNOW IF IT NEEDED CHANGING. PLEASE CALL [phone] IT WAS 175/101 AND THEN 150/92

## 2013-06-22 NOTE — Telephone Encounter (Signed)
Pt called stating that her her bp has went up since her synthroid has been reduced to 100 to 64mcg,please advise [phone].

## 2013-06-22 NOTE — Progress Notes (Signed)
Please call [facility] and cancel this prescription. Her BP elevated readings were not accurate.The prescription was for a higher dose of toprol.

## 2013-06-22 NOTE — Telephone Encounter (Signed)
Please call [facility] and cancel the toprol prescription I called in.

## 2013-06-23 DIAGNOSIS — M8580 Other specified disorders of bone density and structure, unspecified site: Secondary | ICD-10-CM

## 2013-06-23 HISTORY — DX: Other specified disorders of bone density and structure, unspecified site: M85.80

## 2013-06-23 NOTE — Telephone Encounter (Signed)
Cancelled Rx.

## 2013-06-25 NOTE — Telephone Encounter (Signed)
The change in synthroid does not affect your blood pressure.  Please see pcp about bp.

## 2013-06-26 NOTE — Telephone Encounter (Signed)
Left message pt to call if any questions 

## 2013-06-27 ENCOUNTER — Ambulatory Visit (INDEPENDENT_AMBULATORY_CARE_PROVIDER_SITE_OTHER): Payer: BC Managed Care – PPO | Admitting: Emergency Medicine

## 2013-06-27 ENCOUNTER — Encounter: Payer: Self-pay | Admitting: Emergency Medicine

## 2013-06-27 VITALS — BP 126/84 | HR 92 | Temp 97.6°F | Resp 16 | Ht 64.0 in

## 2013-06-27 DIAGNOSIS — Z9109 Other allergy status, other than to drugs and biological substances: Secondary | ICD-10-CM

## 2013-06-27 DIAGNOSIS — E7800 Pure hypercholesterolemia, unspecified: Secondary | ICD-10-CM

## 2013-06-27 DIAGNOSIS — E039 Hypothyroidism, unspecified: Secondary | ICD-10-CM

## 2013-06-27 DIAGNOSIS — I10 Essential (primary) hypertension: Secondary | ICD-10-CM

## 2013-06-27 DIAGNOSIS — Z889 Allergy status to unspecified drugs, medicaments and biological substances status: Secondary | ICD-10-CM

## 2013-06-27 LAB — COMPREHENSIVE METABOLIC PANEL
ALT: 31 U/L (ref 0–35)
AST: 28 U/L (ref 0–37)
Albumin: 4.6 g/dL (ref 3.5–5.2)
BUN: 12 mg/dL (ref 6–23)
CO2: 27 mEq/L (ref 19–32)
Calcium: 9.8 mg/dL (ref 8.4–10.5)
Chloride: 103 mEq/L (ref 96–112)
Potassium: 4.3 mEq/L (ref 3.5–5.3)

## 2013-06-27 LAB — T4, FREE: Free T4: 1.34 ng/dL (ref 0.80–1.80)

## 2013-06-27 LAB — CBC
HCT: 42.9 % (ref 36.0–46.0)
Platelets: 296 10*3/uL (ref 150–400)
RBC: 4.86 MIL/uL (ref 3.87–5.11)
RDW: 13.2 % (ref 11.5–15.5)
WBC: 10.6 10*3/uL — ABNORMAL HIGH (ref 4.0–10.5)

## 2013-06-27 LAB — TSH: TSH: 0.032 u[IU]/mL — ABNORMAL LOW (ref 0.350–4.500)

## 2013-06-27 LAB — LIPID PANEL: LDL Cholesterol: 82 mg/dL (ref 0–99)

## 2013-06-27 LAB — SEDIMENTATION RATE: Sed Rate: 1 mm/hr (ref 0–22)

## 2013-06-27 NOTE — Progress Notes (Signed)
  Subjective:    Patient ID: Michelle [name], female    DOB: [birth year], 52 y.o.   MRN: [id number]  HPI  52 year old female here for follow up on    Still living with her sister.  Went to see Dr. Everardo All.  Able to get out and exercise.  Dr. Audie Box has set her up for another biopsy. She denies chest pain shortness of breath or worsening GI symptoms. These problems have been stable.     Review of Systems     Objective:   Physical Exam HEENT exam is unremarkable. Neck is supple. Chest is clear to auscultation and percussion. Heart regular rate no murmurs or gallops. Abdomen is soft nontender.        Assessment & Plan:  Routine labs done today to followup on her high cholesterol , hypertension, and thyroid disease. She is seeing Karmen Bongo regular.

## 2013-06-30 ENCOUNTER — Telehealth: Payer: Self-pay

## 2013-06-30 NOTE — Telephone Encounter (Signed)
Called about labs.

## 2013-06-30 NOTE — Telephone Encounter (Signed)
Patient asking to speak to Amy L. Only , they spoke yesterday.   Best number: [phone]

## 2013-07-06 ENCOUNTER — Telehealth: Payer: Self-pay

## 2013-07-06 NOTE — Telephone Encounter (Signed)
Amy please call  [phone]

## 2013-07-06 NOTE — Telephone Encounter (Signed)
I see no documentation from Dr. Roseanne Reno in [facility] nor in Dr. Ellis Parents box. Please call Dr. Marca Ancona office for details.

## 2013-07-06 NOTE — Telephone Encounter (Signed)
Called her. Dr Roseanne Reno has seen her for therapy. Patient wants to know if we have gotten any correspondence from him. He recommended Celexa for her. Please advise ( there is nothing scanned in the computer from Dr Roseanne Reno)

## 2013-07-07 MED ORDER — CITALOPRAM HYDROBROMIDE 10 MG PO TABS: 10.0000 mg | ORAL_TABLET | Freq: Every day | ORAL | Status: DC

## 2013-07-07 NOTE — Telephone Encounter (Signed)
Celexa sent

## 2013-07-07 NOTE — Telephone Encounter (Signed)
Dr Roseanne Reno called, he wants her to start Celexa 10mg , advised we can get this sent in for her. Please advise. pended

## 2013-07-12 ENCOUNTER — Encounter: Payer: Self-pay | Admitting: Gynecology

## 2013-07-12 ENCOUNTER — Ambulatory Visit (INDEPENDENT_AMBULATORY_CARE_PROVIDER_SITE_OTHER): Payer: BC Managed Care – PPO | Admitting: Gynecology

## 2013-07-12 DIAGNOSIS — N890 Mild vaginal dysplasia: Secondary | ICD-10-CM

## 2013-07-12 DIAGNOSIS — N893 Dysplasia of vagina, unspecified: Secondary | ICD-10-CM

## 2013-07-12 NOTE — Patient Instructions (Signed)
Followup for Pap smear in one year when you're due for your annual exam.

## 2013-07-12 NOTE — Progress Notes (Signed)
The patient presents for colposcopy due to most recent Pap smear showing VAIN I with positive high-risk HPV, negative subtype 16/18. She is status post vaginal hysterectomy for benign indication without history of abnormal Pap smears previously. She subsequently developed low-grade vaginal dysplasia following her hysterectomy in 2011 with colposcopic biopsy showing VAIN I. Most recent colposcopy with biopsies 02/2012 showed VAIN I.  Exam with Michelle [name] External BUS vagina with moderate atrophic changes. No visual or palpable abnormalities on bimanual exam.  Colposcopy after acetic acid cleanse shows no abnormalities. Good inspection of the upper vaginal cuff and angles. No biopsies taken.  Assessment and plan: Persistent VAIN I, positive high-risk HPV with colposcopy showing no abnormalities. Plan expected management with repeat Pap smear in one year.

## 2013-07-13 ENCOUNTER — Ambulatory Visit (INDEPENDENT_AMBULATORY_CARE_PROVIDER_SITE_OTHER): Payer: BC Managed Care – PPO

## 2013-07-13 DIAGNOSIS — M8580 Other specified disorders of bone density and structure, unspecified site: Secondary | ICD-10-CM

## 2013-07-13 DIAGNOSIS — M899 Disorder of bone, unspecified: Secondary | ICD-10-CM

## 2013-07-14 ENCOUNTER — Encounter: Payer: Self-pay | Admitting: Gynecology

## 2013-07-24 ENCOUNTER — Telehealth: Payer: Self-pay

## 2013-07-24 MED ORDER — ALPRAZOLAM 1 MG PO TABS: 1.0000 mg | ORAL_TABLET | Freq: Every day | ORAL | Status: DC

## 2013-07-24 NOTE — Telephone Encounter (Signed)
Patient says pharmacy has contacted [place] several times to refill alprazolam and they have not heard anything. Would like to know what the status is.

## 2013-07-24 NOTE — Telephone Encounter (Signed)
Called in refill on schedule per Dr Cleta Alberts. Left message to advise.

## 2013-07-28 ENCOUNTER — Telehealth: Payer: Self-pay

## 2013-07-28 NOTE — Telephone Encounter (Signed)
She indicates they are solid silicone, not something that can leak, she states she does not want them taken out.

## 2013-07-28 NOTE — Telephone Encounter (Signed)
Called her. She states ongoing problem for a while. She had implants done about 10 years ago. She has a nerve pain associated with this. Pain shoots from her neck down her arm into her leg, on right. Feels shooting pain, it is annoying for her. She states pain is becoming more constant now. She states these were done by Dr Benna Dunks, who recommended another procedure, or she could have these removed. Patient does not want to have these removed. Wants to know what you can recommend for her, she is using Advil with no relief. She also wants you to know she d/c the Celexa, it made her lightheaded, and she had a tremor. Symptoms of tremor and light headedness have resolved since she d/c the Celexa 3-4 days.

## 2013-07-28 NOTE — Telephone Encounter (Signed)
Pt would like to speak with amy about "something private". Best#[phone]

## 2013-07-28 NOTE — Telephone Encounter (Signed)
Dr Cleta Alberts states he does want her to consider having the implants taken out. He states they may be leaking. Left message for her to call me back

## 2013-07-28 NOTE — Telephone Encounter (Signed)
Please call Griffith and tell her I think he would be a good idea for her to discuss the situation with the plastic surgeon. Once she gets his opinion I would like her to come in and talk to me and we can get a plan for further treatment.

## 2013-07-29 ENCOUNTER — Other Ambulatory Visit: Payer: Self-pay | Admitting: Physician Assistant

## 2013-07-31 NOTE — Telephone Encounter (Signed)
Thanks

## 2013-08-01 ENCOUNTER — Telehealth: Payer: Self-pay

## 2013-08-01 ENCOUNTER — Telehealth: Payer: Self-pay | Admitting: Radiology

## 2013-08-01 DIAGNOSIS — T859XXD Unspecified complication of internal prosthetic device, implant and graft, subsequent encounter: Secondary | ICD-10-CM

## 2013-08-01 NOTE — Telephone Encounter (Signed)
I would prefer she see Dr. Kelly Splinter in that she has very good reputation and I would wait until she could see her rather then the doctor she named.

## 2013-08-01 NOTE — Telephone Encounter (Signed)
Patient states Dr Benna Dunks has declined to see her for this problem, she called Dr Odis Luster office next he would not see, I have given her Jobe Igo number at [facility]

## 2013-08-01 NOTE — Telephone Encounter (Signed)
Called her, see previous.

## 2013-08-01 NOTE — Telephone Encounter (Signed)
Left message for her to call me back, she is to see the plastic surgeon again, does she want [place] to make referral, or does she want to call?

## 2013-08-01 NOTE — Telephone Encounter (Signed)
Lengthy conversation with patient, have spoken to her again. Have given her the name of Wayland Denis, but when she called [employer], was told Ollen Bowl could see her, wants to know if you think this is okay. Please advise, she also indicates we need to send the referral. Please advise.

## 2013-08-01 NOTE — Telephone Encounter (Signed)
See next message

## 2013-08-01 NOTE — Telephone Encounter (Signed)
PT STATES SHE THINK SHE AMY WAS GOING TO SET HER UP WITH A REFERRAL AND SHE WANTED TO CHECK THE STATUS PLEASE CALL [phone]

## 2013-08-03 NOTE — Telephone Encounter (Signed)
Have called patient, have seen if Lupita Leash will work on this.

## 2013-08-03 NOTE — Telephone Encounter (Signed)
Will you see if Dr Kelly Splinter will see her?

## 2013-08-04 ENCOUNTER — Telehealth: Payer: Self-pay

## 2013-08-04 NOTE — Telephone Encounter (Signed)
Patient is returning amys call

## 2013-08-04 NOTE — Telephone Encounter (Signed)
Patient has been advised, will check on the referral again, and patient knows to call me about this.

## 2013-08-04 NOTE — Telephone Encounter (Signed)
Spoke to patient about Engineer, petroleum. Patient will come in Monday to be seen.

## 2013-08-07 ENCOUNTER — Ambulatory Visit: Payer: BC Managed Care – PPO

## 2013-08-07 ENCOUNTER — Ambulatory Visit (INDEPENDENT_AMBULATORY_CARE_PROVIDER_SITE_OTHER): Payer: BC Managed Care – PPO | Admitting: Emergency Medicine

## 2013-08-07 ENCOUNTER — Encounter: Payer: Self-pay | Admitting: Emergency Medicine

## 2013-08-07 ENCOUNTER — Telehealth: Payer: Self-pay | Admitting: Family Medicine

## 2013-08-07 VITALS — BP 134/86 | HR 83 | Temp 98.0°F | Resp 16 | Ht 64.0 in | Wt 132.0 lb

## 2013-08-07 DIAGNOSIS — M549 Dorsalgia, unspecified: Secondary | ICD-10-CM

## 2013-08-07 DIAGNOSIS — M542 Cervicalgia: Secondary | ICD-10-CM

## 2013-08-07 DIAGNOSIS — T859XXA Unspecified complication of internal prosthetic device, implant and graft, initial encounter: Secondary | ICD-10-CM

## 2013-08-07 NOTE — Progress Notes (Signed)
  Subjective:    Patient ID: Michelle [name], female    DOB: 14-Dec-1960, 52 y.o.   MRN: [id number]  HPI patient is with severe discomfort in her buttocks lower back and down both legs but worse on the right. Pertinent history reveals that in the 1990s she flew to [place] and had gluteal implants placed. Apparently these are silicone implants. Over the years with her change in weight and loss of weight they have started putting pressure on her buttocks and she has developed radicular symptoms down both legs which are worse on the right. She is currently unable to lay flat due to this discomfort. The discomfort has been quite severe and she is treated it with over-the-counter medications. She has also had some neck discomfort and tingling in her right hand but no other current symptoms. 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 Her reflexes and motor strength of the upper chest remedies are normal. Abdominal exam is soft liver and spleen not enlarged no tenderness or masses. Examination of the buttocks reveals orange-sized firm mass is present in both buttocks. These are not fixed to the underlying skin. There is significant discomfort caused with pressure on these areas. The deep tendon reflexes the lower extremities are 2+ and symmetrical motor strength 5 out of 5. She does have discomfort with straight leg testing bilaterally.   [facility] reading (PRIMARY) by  Dr.Daub C-spine films reveals some arthritic changes C7 is not seen. Views of the pelvis are normal there is calcification posterior to this sacrum at the site of her gluteal implants LS-spine film is unremarkable      Assessment & Plan:  Referral to be made to Dr. Kelly Splinter plastic surgeon for her evaluation of these areas. She saw a Engineer, petroleum last week who did recommend they be removed. She will  need a repeat chest x-ray prior to her surgery because she did have a infiltrate present during the summer consistent with pneumonia. We'll also repeat quest cardiac clearance from Dr. Jacinto Halim She has significant allergies and these are an issue with surgery and her surgery should be performed at the hospital.

## 2013-08-07 NOTE — Telephone Encounter (Signed)
Spoke with patient and let her know that her bone density scan stated that left hip was low bone but other eight bones were normal.  She stated that she understood and had no further  Questions at this time.

## 2013-08-08 ENCOUNTER — Other Ambulatory Visit: Payer: Self-pay | Admitting: Radiology

## 2013-08-08 MED ORDER — CYCLOBENZAPRINE HCL 5 MG PO TABS: 5.0000 mg | ORAL_TABLET | Freq: Two times a day (BID) | ORAL | Status: DC | PRN

## 2013-08-08 NOTE — Telephone Encounter (Signed)
Sent in Flexeril per Dr Cleta Alberts.

## 2013-08-10 ENCOUNTER — Other Ambulatory Visit: Payer: Self-pay | Admitting: Radiology

## 2013-08-10 ENCOUNTER — Telehealth: Payer: Self-pay

## 2013-08-10 MED ORDER — EPINEPHRINE 0.3 MG/0.3ML IJ SOAJ: 0.3000 mg | Freq: Once | INTRAMUSCULAR | Status: DC

## 2013-08-10 NOTE — Telephone Encounter (Signed)
Lilli Few q resent pharmacy indicates it cam across as regular epi pen.

## 2013-08-10 NOTE — Telephone Encounter (Signed)
Patient's epi pens have expired. States that she and Dr. Cleta Alberts have talked about her trying the Auvi Q. Patient uses [facility] on [address]. [phone]

## 2013-08-10 NOTE — Telephone Encounter (Signed)
Sent in per her request. This is the one that gives instructions to her as she uses it. Patient wants because it is small. It may be expensive. Patient advised

## 2013-08-11 ENCOUNTER — Telehealth: Payer: Self-pay | Admitting: Radiology

## 2013-08-11 NOTE — Telephone Encounter (Signed)
Dr Kelly Splinter has declined to see patient, because she has already been evaluated by Dr Marzetta Board.  Dr Cleta Alberts would like patient to call the office and see if they will reconsider. I will call her today

## 2013-08-22 ENCOUNTER — Telehealth: Payer: Self-pay

## 2013-08-22 NOTE — Telephone Encounter (Signed)
[facility] needs a copy of patient's lab results and EKG from most recent visit.   Phone: [phone]  Fax: [phone]

## 2013-08-23 ENCOUNTER — Other Ambulatory Visit: Payer: Self-pay | Admitting: Emergency Medicine

## 2013-08-23 NOTE — Telephone Encounter (Signed)
Last labs and EKG faxed with confirmation to [facility].

## 2013-08-24 ENCOUNTER — Other Ambulatory Visit: Payer: Self-pay | Admitting: Radiology

## 2013-08-24 MED ORDER — GABAPENTIN 100 MG PO CAPS: ORAL_CAPSULE | ORAL | Status: DC

## 2013-08-24 NOTE — Telephone Encounter (Signed)
Sent in Gabapentin she is advised to call and let me know how she does on this.

## 2013-09-16 ENCOUNTER — Encounter: Payer: Self-pay | Admitting: Emergency Medicine

## 2013-09-19 ENCOUNTER — Telehealth: Payer: Self-pay | Admitting: Radiology

## 2013-09-19 NOTE — Telephone Encounter (Signed)
Patient advised letter at front desk for pick up

## 2013-09-20 ENCOUNTER — Other Ambulatory Visit: Payer: Self-pay | Admitting: Emergency Medicine

## 2013-09-21 ENCOUNTER — Other Ambulatory Visit: Payer: Self-pay | Admitting: Radiology

## 2013-09-21 ENCOUNTER — Telehealth: Payer: Self-pay | Admitting: Radiology

## 2013-09-21 MED ORDER — ALPRAZOLAM 1 MG PO TABS: ORAL_TABLET | ORAL | Status: DC

## 2013-09-21 NOTE — Telephone Encounter (Signed)
Patient returned call & ask that you try her again tomorrow @ home #

## 2013-09-21 NOTE — Telephone Encounter (Signed)
Patient requesting increase in her Alprazolam. Dr Cleta Alberts does not want to increase this, he wants her to add in Melatonin 3mg  with her current dose of Alprazolam. Left message to have her call me back. Will send in renewal on the current Alprazolam dose, she can continue this, and should refill on schedule

## 2013-09-22 NOTE — Telephone Encounter (Signed)
Called again. She does not need increased Alprazolam dose, she needs something for pain. She states she is not sleeping due to pain. Trey Paula will be home on Wednesday. She is asking for an elixer that you can get in the hospital, she wants to know if you know what this is. She wants it, but does not know what it is called.

## 2013-09-23 NOTE — Telephone Encounter (Signed)
Please ask patient to call when her husband is back and can and we can discuss pain medications at that time. It is the medication she was asking about Lortab elixir ?

## 2013-09-29 ENCOUNTER — Other Ambulatory Visit: Payer: Self-pay | Admitting: Emergency Medicine

## 2013-10-17 ENCOUNTER — Other Ambulatory Visit: Payer: Self-pay | Admitting: Emergency Medicine

## 2013-10-21 ENCOUNTER — Other Ambulatory Visit: Payer: Self-pay | Admitting: Emergency Medicine

## 2013-10-30 ENCOUNTER — Other Ambulatory Visit: Payer: Self-pay | Admitting: Gynecology

## 2013-10-31 ENCOUNTER — Ambulatory Visit: Payer: BC Managed Care – PPO | Admitting: Emergency Medicine

## 2013-11-10 ENCOUNTER — Other Ambulatory Visit: Payer: Self-pay

## 2013-11-10 ENCOUNTER — Other Ambulatory Visit: Payer: Self-pay | Admitting: Emergency Medicine

## 2013-11-10 MED ORDER — LEVOTHYROXINE SODIUM 75 MCG PO TABS: 75.0000 ug | ORAL_TABLET | Freq: Every day | ORAL | Status: DC

## 2013-11-12 ENCOUNTER — Telehealth: Payer: Self-pay

## 2013-11-12 NOTE — Telephone Encounter (Signed)
Okay to approve for refills. She gets one week of prescriptions at a time a total of 9 pills. She can have refills x4

## 2013-11-12 NOTE — Telephone Encounter (Signed)
Patient needs refill on Alprazolam. [facility] [address]  [phone]

## 2013-11-13 MED ORDER — ALPRAZOLAM 1 MG PO TABS: ORAL_TABLET | ORAL | Status: DC

## 2013-11-13 NOTE — Telephone Encounter (Signed)
Called in for her

## 2013-11-14 ENCOUNTER — Telehealth: Payer: Self-pay | Admitting: Radiology

## 2013-11-14 NOTE — Telephone Encounter (Signed)
Called patient.  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 I explained to her we have to put on the form her past suicidal attempt. She indicates she wants you to fill out the forms, she understands this will be on there, if you would let them know this was greater than 2 yrs ago, and currently she is stable. She does not think this will disqualify her.

## 2013-11-20 ENCOUNTER — Telehealth: Payer: Self-pay

## 2013-11-20 NOTE — Telephone Encounter (Signed)
PT WOULD LIKE A CALL BACK FROM AMY. STATES IT IS KINDA IMPORTANT. PLEASE CALL [phone]

## 2013-11-21 NOTE — Telephone Encounter (Signed)
Left message for her to call me back. 

## 2013-11-23 NOTE — Telephone Encounter (Signed)
1.Called her, advised her forms have been completed and faxed for her. Copy mailed to her. She is asking for a letter from you in addition to the letter from Vivia Budge 2.Patient has appt with Dr Migdalia Dk on Jan 27th. She is concerned because she has to wait so long. She indicates she is in a lot of pain, she is on a waiting list for a sooner appointment. She was initially told Dr Migdalia Dk would call her, but now she is having to wait for an appt with her. 3. Patient indicates she needs pain meds. Please note she feels like you should trust her at this point with medications, she feels stable, she feels like you should help with this issue, as it has been greater than 2 years since her hospitalization, she is seeing the therapist and she wants you to trust her.  4.she has filed another appeal with the insurance company regarding her surgery for removal of the implants, advised her we were told it was denied, but she has since filed for a peer to peer discussion of this.

## 2013-11-24 NOTE — Telephone Encounter (Signed)
I cannot give her pain medications that she would have at home to self administer. I can dispense a limited number of pain pills to her husband [name]. Ask her which pain medications she is able to take. [name] would have to come by and talk with him about it. I am sorry that her surgery scheduling is taking so long

## 2013-11-28 NOTE — Telephone Encounter (Signed)
I will be happy to discuss the issue with Glenard Haring and Jejj when he returns to town. I need to see [name] anyway to followup on his pneumonia

## 2013-11-28 NOTE — Telephone Encounter (Signed)
Called her, to find out what medication, she states [name] will be back on Wed. She states liquid meds work best because of her stomach. She does not know name of the medication, perhaps a liquid hydrocodone. Please advise. She states son Rodman Key is in town with them, Rodman Key cell [phone] if you want to speak to him. She does understand you can not dispense to her.

## 2013-11-29 ENCOUNTER — Telehealth: Payer: Self-pay

## 2013-11-29 ENCOUNTER — Other Ambulatory Visit: Payer: Self-pay | Admitting: Radiology

## 2013-11-29 MED ORDER — HYDROCODONE-ACETAMINOPHEN 7.5-500 MG/15ML PO SOLN
5.0000 mL | Freq: Four times a day (QID) | ORAL | Status: DC | PRN
Start: 2013-11-29 — End: 2013-11-30

## 2013-11-29 NOTE — Telephone Encounter (Signed)
Spoke to [place] about the situation. He is very angry patient has not been given anything yet. I have spoken to Dr Everlene Farrier and he is calling [name] now.

## 2013-11-29 NOTE — Telephone Encounter (Signed)
He wants to see [name] to follow up pneumonia. Vinie Sill, they can not come in on Friday, he wants to schedule. Have transferred him to make appt.

## 2013-11-29 NOTE — Telephone Encounter (Signed)
Dr Everlene Farrier has spoken to [place]. Rx provided for patient for Michelle [name] to administer. Patient may not administer this herself. Michelle [name] will pick this up

## 2013-11-29 NOTE — Telephone Encounter (Signed)
Rx at front desk for [name] to pick up

## 2013-11-29 NOTE — Telephone Encounter (Signed)
Patients spouse [name] called on her behalf to ask if they can rewrite her medication; HYDROcodone-acetaminophen (LORTAB) 7.5-500 MG/15ML solution  from 500mg  (which pharmacy says no longer exists?) to 350mg . Patient sees Dr. Everlene Farrier. Pharmacy is [facility] [address]: [place], [place] - [address]  Please call if this can be fixed, patient is completely out and says this is urgent.   [phone]

## 2013-11-30 MED ORDER — HYDROCODONE-ACETAMINOPHEN 7.5-325 MG/15ML PO SOLN: 5.0000 mL | Freq: Four times a day (QID) | ORAL | Status: DC | PRN

## 2013-11-30 NOTE — Telephone Encounter (Signed)
Called her to advise.  

## 2013-11-30 NOTE — Telephone Encounter (Signed)
Amy will use see if we can rewrite her prescription and I can sign it and they can pick it up. The dose that we wrote for is the one I thought was available but apparently not

## 2013-11-30 NOTE — Telephone Encounter (Signed)
Reprinted.

## 2013-11-30 NOTE — Telephone Encounter (Signed)
Patient husband called to speak with Amy about pain rx. Says she concentration it was written for is not available. Needs to be rewritten for 325 instead of 500. Also says can we call [facility] for clarification on concentration. Cb# [id number]. Says they are going out of town and needs asap.

## 2013-12-02 ENCOUNTER — Encounter: Payer: Self-pay | Admitting: Emergency Medicine

## 2013-12-05 ENCOUNTER — Telehealth: Payer: Self-pay

## 2013-12-05 NOTE — Telephone Encounter (Signed)
Patient would like to speak with amy i explained to her that amy had switched positions but she still insisted on talking with only amy please call her at [phone] or [phone]

## 2013-12-06 NOTE — Telephone Encounter (Signed)
Lm for rtn call 

## 2013-12-06 NOTE — Telephone Encounter (Signed)
[facility] [address] HYDROcodone-acetaminophen (HYCET) 7.5-325 mg/15 ml solution   [phone]

## 2013-12-08 ENCOUNTER — Telehealth: Payer: Self-pay | Admitting: Family Medicine

## 2013-12-08 NOTE — Telephone Encounter (Signed)
Dr Everlene Farrier it looks like her medicine didn't get corrected the first time on her dosage could you please look at messages and send back to pool

## 2013-12-09 ENCOUNTER — Other Ambulatory Visit: Payer: Self-pay | Admitting: Emergency Medicine

## 2013-12-09 MED ORDER — HYDROCODONE-ACETAMINOPHEN 7.5-325 MG/15ML PO SOLN: 5.0000 mL | Freq: Four times a day (QID) | ORAL | Status: DC | PRN

## 2013-12-09 NOTE — Telephone Encounter (Signed)
The prescription that is in the meds and orders is correct. I refilled the medication

## 2013-12-09 NOTE — Telephone Encounter (Signed)
Advised pt. Rx in the pick up folder

## 2013-12-09 NOTE — Telephone Encounter (Signed)
Duplicate message- Rx in pick up box, pt notified

## 2013-12-15 ENCOUNTER — Other Ambulatory Visit: Payer: Self-pay | Admitting: Emergency Medicine

## 2013-12-18 NOTE — Telephone Encounter (Signed)
faxed

## 2014-01-02 ENCOUNTER — Encounter: Payer: BC Managed Care – PPO | Admitting: Emergency Medicine

## 2014-01-04 NOTE — Progress Notes (Signed)
This encounter was created in error - please disregard.  This encounter was created in error - please disregard.

## 2014-01-12 NOTE — Progress Notes (Signed)
This encounter was created in error - please disregard.

## 2014-01-15 ENCOUNTER — Other Ambulatory Visit: Payer: Self-pay | Admitting: Emergency Medicine

## 2014-01-16 ENCOUNTER — Other Ambulatory Visit: Payer: Self-pay | Admitting: Emergency Medicine

## 2014-01-16 NOTE — Telephone Encounter (Signed)
01/16/2014 - PATIENT CALLED TO FIND OUT WHY SHE HAS NOT BEEN ABLE TO GET REFILLS ON HER GENERIC XANAX AND CRESTOR? SHE SAID THE PHARMACY HAS TRIED SEVERAL TIMES TO CONTACT us. SHE USUALLY SEES DR. DAUB. BEST PHONE [phone] (CELL)  PHARMACY CHOICE IS [facility] [address]  [place]

## 2014-01-18 NOTE — Telephone Encounter (Signed)
faxed

## 2014-01-18 NOTE — Telephone Encounter (Signed)
Pt notified both Rxs were sent

## 2014-02-08 ENCOUNTER — Telehealth: Payer: Self-pay

## 2014-02-08 NOTE — Telephone Encounter (Signed)
Pt is calling for a refill of alprazolam.  [phone]

## 2014-02-09 NOTE — Telephone Encounter (Signed)
Okay to call in a refill of her Xanax. She gets her prescription each week. She gets a total of 9 pills. She can have 6 refills. She refills her medication each week

## 2014-02-11 ENCOUNTER — Telehealth: Payer: Self-pay

## 2014-02-11 MED ORDER — LEVOTHYROXINE SODIUM 75 MCG PO TABS: 75.0000 ug | ORAL_TABLET | Freq: Every day | ORAL | Status: DC

## 2014-02-11 MED ORDER — ALPRAZOLAM 1 MG PO TABS: ORAL_TABLET | ORAL | Status: DC

## 2014-02-11 NOTE — Telephone Encounter (Signed)
Synthroid sent, follow up with Dr. Everlene Farrier as planned

## 2014-02-11 NOTE — Telephone Encounter (Signed)
FYI- Only able to call in 5 refills due to Control substance rules. Called into pharmacy.

## 2014-02-11 NOTE — Telephone Encounter (Signed)
Pt.notified

## 2014-02-11 NOTE — Telephone Encounter (Signed)
PT NEEDS REFILLS OF SYNTHROID. PT ALSO WANTS SARAH TO KNOW THAT SHE SCHEDULED AN APPT WITH DR. DAUB AT HIS CLINIC ON THE [facility] [facility] [address].

## 2014-02-12 NOTE — Telephone Encounter (Signed)
Notified pt sent and she will f/up as planned.

## 2014-02-15 ENCOUNTER — Telehealth: Payer: Self-pay | Admitting: Family Medicine

## 2014-02-15 ENCOUNTER — Telehealth: Payer: Self-pay

## 2014-02-15 ENCOUNTER — Other Ambulatory Visit: Payer: Self-pay | Admitting: Gynecology

## 2014-02-15 NOTE — Telephone Encounter (Signed)
Patient contacted Dr. Everlene Farrier via text regarding current situation with husband: "I'm not going anywhere. I'm going to stay with Jeff's aunt sorry to worry you. For that I apologize. He caught me off guard. Was easier to vent. Sorry you were the recipient. I'll be just fine. Had to blow my top first" she will keep appt with Dr. Everlene Farrier tomorrow 02/16/14.

## 2014-02-15 NOTE — Telephone Encounter (Signed)
Patient requesting to talk with Dr. Everlene Farrier - urgently.  Extremely upset.    [phone]

## 2014-02-15 NOTE — Telephone Encounter (Signed)
I received a call from the patient that her husband was leaving. She states she found messages on his telephone that he was having an affair. She is angry saying she is not to take this anymore. I agreed to call air in [place] and discuss the situation with him and see if he could see her with a walk-in appointment. I also discussed the situation with her husband who was at home

## 2014-02-16 ENCOUNTER — Ambulatory Visit: Payer: BC Managed Care – PPO | Admitting: Emergency Medicine

## 2014-02-16 ENCOUNTER — Telehealth: Payer: Self-pay | Admitting: Radiology

## 2014-02-16 NOTE — Telephone Encounter (Signed)
Phone call/ paint fumes are terrible today at the appt center, have RS her to Monday at 815 per Dr Everlene Farrier

## 2014-02-19 ENCOUNTER — Ambulatory Visit: Payer: BC Managed Care – PPO | Admitting: Emergency Medicine

## 2014-02-19 ENCOUNTER — Ambulatory Visit (INDEPENDENT_AMBULATORY_CARE_PROVIDER_SITE_OTHER): Payer: BC Managed Care – PPO | Admitting: Emergency Medicine

## 2014-02-19 VITALS — BP 136/86 | HR 95 | Temp 98.2°F | Resp 16 | Ht 64.0 in

## 2014-02-19 DIAGNOSIS — L989 Disorder of the skin and subcutaneous tissue, unspecified: Secondary | ICD-10-CM

## 2014-02-19 DIAGNOSIS — C73 Malignant neoplasm of thyroid gland: Secondary | ICD-10-CM

## 2014-02-19 DIAGNOSIS — Z9151 Personal history of suicidal behavior: Secondary | ICD-10-CM

## 2014-02-19 DIAGNOSIS — E782 Mixed hyperlipidemia: Secondary | ICD-10-CM

## 2014-02-19 DIAGNOSIS — Z915 Personal history of self-harm: Secondary | ICD-10-CM

## 2014-02-19 DIAGNOSIS — I10 Essential (primary) hypertension: Secondary | ICD-10-CM

## 2014-02-19 LAB — LIPID PANEL
CHOL/HDL RATIO: 2.9 ratio
CHOLESTEROL: 152 mg/dL (ref 0–200)
HDL: 53 mg/dL (ref >39)
LDL Cholesterol: 78 mg/dL (ref 0–99)
TRIGLYCERIDES: 107 mg/dL (ref <150)
VLDL: 21 mg/dL (ref 0–40)

## 2014-02-19 LAB — POCT CBC
GRANULOCYTE PERCENT: 58 % (ref 37–80)
HEMATOCRIT: 45.2 % (ref 37.7–47.9)
HEMOGLOBIN: 14.8 g/dL (ref 12.2–16.2)
Lymph, poc: 3.6 — AB (ref 0.6–3.4)
MCH, POC: 31.2 pg (ref 27–31.2)
MCHC: 32.7 g/dL (ref 31.8–35.4)
MCV: 95.3 fL (ref 80–97)
MID (cbc): 0.7 (ref 0–0.9)
MPV: 8.2 fL (ref 0–99.8)
POC Granulocyte: 5.9 (ref 2–6.9)
POC LYMPH PERCENT: 35.2 %L (ref 10–50)
POC MID %: 6.8 %M (ref 0–12)
Platelet Count, POC: 333 10*3/uL (ref 142–424)
RBC: 4.74 M/uL (ref 4.04–5.48)
RDW, POC: 12.8 %
WBC: 10.2 10*3/uL (ref 4.6–10.2)

## 2014-02-19 LAB — COMPLETE METABOLIC PANEL WITH GFR
ALK PHOS: 64 U/L (ref 39–117)
ALT: 31 U/L (ref 0–35)
AST: 27 U/L (ref 0–37)
Albumin: 5.1 g/dL (ref 3.5–5.2)
BILIRUBIN TOTAL: 1 mg/dL (ref 0.2–1.2)
BUN: 14 mg/dL (ref 6–23)
CO2: 26 mEq/L (ref 19–32)
Calcium: 10 mg/dL (ref 8.4–10.5)
Chloride: 101 mEq/L (ref 96–112)
Creat: 0.81 mg/dL (ref 0.50–1.10)
GFR, Est African American: >89 mL/min
GFR, Est Non African American: 83 mL/min
Glucose, Bld: 98 mg/dL (ref 70–99)
POTASSIUM: 3.9 meq/L (ref 3.5–5.3)
SODIUM: 139 meq/L (ref 135–145)
TOTAL PROTEIN: 7.4 g/dL (ref 6.0–8.3)

## 2014-02-19 LAB — T4, FREE: Free T4: 1.47 ng/dL (ref 0.80–1.80)

## 2014-02-19 LAB — TSH: TSH: 0.475 u[IU]/mL (ref 0.350–4.500)

## 2014-02-19 MED ORDER — ROSUVASTATIN CALCIUM 20 MG PO TABS: ORAL_TABLET | ORAL | Status: DC

## 2014-02-19 NOTE — Progress Notes (Signed)
   Subjective:    Patient ID: Michelle [name], female    DOB: [birth year], 53 y.o.   MRN: [id number]  Chief Complaint  Patient presents with  . Pt didn't want to discuss    Medications, Allergies, past medical history, surgical history, family history, social history and problem list reviewed    HPI patient enters for followup of her hyperlipidemia hypertension asthma and history of thyroid cancer. She had a crisis last week with her husband. He was planning on leaving but has since decided to stay. She has subsequently stopped taking Xanax at night and has had difficulty with her sleeping. She currently is taking Benadryl and plans to try some melatonin to     Review of Systems     Objective:   Physical Exam HEENT exam is unremarkable. Chest is clear heart regular rate no murmurs abdomen is soft liver spleen not enlarged extremity exam reveals a 2 x 3 mm cystic-like area between the fourth and fifth toes.  Results for orders placed in visit on 02/19/14  POCT CBC      Result Value Ref Range   WBC 10.2  4.6 - 10.2 K/uL   Lymph, poc 3.6 (*) 0.6 - 3.4   POC LYMPH PERCENT 35.2  10 - 50 %L   MID (cbc) 0.7  0 - 0.9   POC MID % 6.8  0 - 12 %M   POC Granulocyte 5.9  2 - 6.9   Granulocyte percent 58.0  37 - 80 %G   RBC 4.74  4.04 - 5.48 M/uL   Hemoglobin 14.8  12.2 - 16.2 g/dL   HCT, POC 45.2  37.7 - 47.9 %   MCV 95.3  80 - 97 fL   MCH, POC 31.2  27 - 31.2 pg   MCHC 32.7  31.8 - 35.4 g/dL   RDW, POC 12.8     Platelet Count, POC 333  142 - 424 K/uL   MPV 8.2  0 - 99.8 fL         Assessment & Plan:  1. Essential hypertension, benign Controlled.  - COMPLETE METABOLIC PANEL WITH GFR - POCT CBC  2. Mixed hyperlipidemia Await labs - COMPLETE METABOLIC PANEL WITH GFR - Lipid panel - POCT CBC  3. Thyroid cancer Stable. follw wuth Dr. Loanne Drilling as before - T4, free - TSH - Thyroglobulin antibody - POCT CBC  4. Foot lesion Suspect benign lesion - Ambulatory referral to  Dermatology  5. H/O suicide attempt No further attempts. In therapy. 6 lesion foot referral made to obtain Michelle [name] for her to check the area

## 2014-02-20 ENCOUNTER — Other Ambulatory Visit: Payer: Self-pay | Admitting: Radiology

## 2014-02-20 LAB — THYROGLOBULIN ANTIBODY

## 2014-02-20 MED ORDER — CETIRIZINE HCL 10 MG PO TABS: 10.0000 mg | ORAL_TABLET | Freq: Every day | ORAL | Status: DC

## 2014-03-12 ENCOUNTER — Other Ambulatory Visit: Payer: Self-pay | Admitting: Physician Assistant

## 2014-03-12 NOTE — Telephone Encounter (Signed)
OK to give RFs?

## 2014-03-17 ENCOUNTER — Telehealth: Payer: Self-pay

## 2014-03-17 MED ORDER — LEVOTHYROXINE SODIUM 75 MCG PO TABS: 75.0000 ug | ORAL_TABLET | Freq: Every day | ORAL | Status: DC

## 2014-03-17 NOTE — Telephone Encounter (Signed)
Patient calling to get a refill on her synthroid medication. I told her to still contact pharmacy. She said she has no refills left on it and that's why she needs to put in a request by telephone. She said she will contact them still so we can get an electronic request. Please advise. She only has two pills left of her synthroid medication.   Call home: [phone]

## 2014-03-17 NOTE — Telephone Encounter (Signed)
6 refills called into pharmacy for pt.

## 2014-07-01 ENCOUNTER — Other Ambulatory Visit: Payer: Self-pay | Admitting: Emergency Medicine

## 2014-08-16 ENCOUNTER — Other Ambulatory Visit: Payer: Self-pay | Admitting: Physician Assistant

## 2014-08-31 ENCOUNTER — Other Ambulatory Visit: Payer: Self-pay | Admitting: Gynecology

## 2014-09-10 ENCOUNTER — Telehealth: Payer: Self-pay

## 2014-09-10 NOTE — Telephone Encounter (Signed)
[facility] [address] called to let us know they received a notice that they are supposed to send requests electronically. They said they are not able too and patient is needing a refill on levothyroxine (SYNTHROID) 75 MCG tablet Last refilled on 09/16 by Chelle please advise.   [phone]

## 2014-09-11 MED ORDER — LEVOTHYROXINE SODIUM 75 MCG PO TABS: 75.0000 ug | ORAL_TABLET | Freq: Every day | ORAL | Status: DC

## 2014-09-11 NOTE — Telephone Encounter (Signed)
Refill sent to the pharmacy. Called pharmacy to find out problem with sending electronically-

## 2014-09-24 ENCOUNTER — Other Ambulatory Visit: Payer: Self-pay | Admitting: Physician Assistant

## 2014-10-16 ENCOUNTER — Other Ambulatory Visit: Payer: Self-pay | Admitting: Physician Assistant

## 2014-10-17 NOTE — Telephone Encounter (Signed)
Called pt to ask about f/up plan. She stated that she had not gotten messages on her RFs we have sent. Reports that her BP has been running very good, around 115/70s Pt would rather come to appt instead of walk in so she isn't exposed to illnesses. Transferred pt to [facility] and she has appt on 11/13/14. Sent in 1 mos RF.

## 2014-10-23 ENCOUNTER — Ambulatory Visit (INDEPENDENT_AMBULATORY_CARE_PROVIDER_SITE_OTHER): Payer: BC Managed Care – PPO | Admitting: Family Medicine

## 2014-10-23 VITALS — BP 126/82 | HR 112 | Temp 98.0°F | Resp 18 | Ht 64.0 in | Wt 136.0 lb

## 2014-10-23 DIAGNOSIS — J4521 Mild intermittent asthma with (acute) exacerbation: Secondary | ICD-10-CM

## 2014-10-23 DIAGNOSIS — J209 Acute bronchitis, unspecified: Secondary | ICD-10-CM

## 2014-10-23 MED ORDER — PREDNISONE 20 MG PO TABS: ORAL_TABLET | ORAL | Status: DC

## 2014-10-23 MED ORDER — AZITHROMYCIN 250 MG PO TABS: ORAL_TABLET | ORAL | Status: DC

## 2014-10-23 MED ORDER — HYDROCODONE-ACETAMINOPHEN 7.5-325 MG/15ML PO SOLN: 5.0000 mL | Freq: Four times a day (QID) | ORAL | Status: DC | PRN

## 2014-10-23 MED ORDER — ALBUTEROL SULFATE HFA 108 (90 BASE) MCG/ACT IN AERS: 2.0000 | INHALATION_SPRAY | RESPIRATORY_TRACT | Status: DC | PRN

## 2014-10-23 NOTE — Patient Instructions (Signed)
Acute Bronchitis Bronchitis is inflammation of the airways that extend from the windpipe into the lungs (bronchi). The inflammation often causes mucus to develop. This leads to a cough, which is the most common symptom of bronchitis.  In acute bronchitis, the condition usually develops suddenly and goes away over time, usually in a couple weeks. Smoking, allergies, and asthma can make bronchitis worse. Repeated episodes of bronchitis may cause further lung problems.  CAUSES Acute bronchitis is most often caused by the same virus that causes a cold. The virus can spread from person to person (contagious) through coughing, sneezing, and touching contaminated objects. SIGNS AND SYMPTOMS   Cough.   Fever.   Coughing up mucus.   Body aches.   Chest congestion.   Chills.   Shortness of breath.   Sore throat.  DIAGNOSIS  Acute bronchitis is usually diagnosed through a physical exam. Your health care provider will also ask you questions about your medical history. Tests, such as chest X-rays, are sometimes done to rule out other conditions.  TREATMENT  Acute bronchitis usually goes away in a couple weeks. Oftentimes, no medical treatment is necessary. Medicines are sometimes given for relief of fever or cough. Antibiotic medicines are usually not needed but may be prescribed in certain situations. In some cases, an inhaler may be recommended to help reduce shortness of breath and control the cough. A cool mist vaporizer may also be used to help thin bronchial secretions and make it easier to clear the chest.  HOME CARE INSTRUCTIONS  Get plenty of rest.   Drink enough fluids to keep your urine clear or pale yellow (unless you have a medical condition that requires fluid restriction). Increasing fluids may help thin your respiratory secretions (sputum) and reduce chest congestion, and it will prevent dehydration.   Take medicines only as directed by your health care provider.  If  you were prescribed an antibiotic medicine, finish it all even if you start to feel better.  Avoid smoking and secondhand smoke. Exposure to cigarette smoke or irritating chemicals will make bronchitis worse. If you are a smoker, consider using nicotine gum or skin patches to help control withdrawal symptoms. Quitting smoking will help your lungs heal faster.   Reduce the chances of another bout of acute bronchitis by washing your hands frequently, avoiding people with cold symptoms, and trying not to touch your hands to your mouth, nose, or eyes.   Keep all follow-up visits as directed by your health care provider.  SEEK MEDICAL CARE IF: Your symptoms do not improve after 1 week of treatment.  SEEK IMMEDIATE MEDICAL CARE IF:  You develop an increased fever or chills.   You have chest pain.   You have severe shortness of breath.  You have bloody sputum.   You develop dehydration.  You faint or repeatedly feel like you are going to pass out.  You develop repeated vomiting.  You develop a severe headache. MAKE SURE YOU:   Understand these instructions.  Will watch your condition.  Will get help right away if you are not doing well or get worse. Document Released: 12/17/2004 Document Revised: 03/26/2014 Document Reviewed: 05/02/2013 [facility] Patient Information 2015 [place], [place]. This information is not intended to replace advice given to you by your health care provider. Make sure you discuss any questions you have with your health care provider. Asthma, Acute Bronchospasm Acute bronchospasm caused by asthma is also referred to as an asthma attack. Bronchospasm means your air passages become narrowed. The  narrowing is caused by inflammation and tightening of the muscles in the air tubes (bronchi) in your lungs. This can make it hard to breathe or cause you to wheeze and cough. CAUSES Possible triggers are:  Animal dander from the skin, hair, or feathers of  animals.  Dust mites contained in house dust.  Cockroaches.  Pollen from trees or grass.  Mold.  Cigarette or tobacco smoke.  Air pollutants such as dust, household cleaners, hair sprays, aerosol sprays, paint fumes, strong chemicals, or strong odors.  Cold air or weather changes. Cold air may trigger inflammation. Winds increase molds and pollens in the air.  Strong emotions such as crying or laughing hard.  Stress.  Certain medicines such as aspirin or beta-blockers.  Sulfites in foods and drinks, such as dried fruits and wine.  Infections or inflammatory conditions, such as a flu, cold, or inflammation of the nasal membranes (rhinitis).  Gastroesophageal reflux disease (GERD). GERD is a condition where stomach acid backs up into your esophagus.  Exercise or strenuous activity. SIGNS AND SYMPTOMS   Wheezing.  Excessive coughing, particularly at night.  Chest tightness.  Shortness of breath. DIAGNOSIS  Your health care provider will ask you about your medical history and perform a physical exam. A chest X-ray or blood testing may be performed to look for other causes of your symptoms or other conditions that may have triggered your asthma attack. TREATMENT  Treatment is aimed at reducing inflammation and opening up the airways in your lungs. Most asthma attacks are treated with inhaled medicines. These include quick relief or rescue medicines (such as bronchodilators) and controller medicines (such as inhaled corticosteroids). These medicines are sometimes given through an inhaler or a nebulizer. Systemic steroid medicine taken by mouth or given through an IV tube also can be used to reduce the inflammation when an attack is moderate or severe. Antibiotic medicines are only used if a bacterial infection is present.  HOME CARE INSTRUCTIONS   Rest.  Drink plenty of liquids. This helps the mucus to remain thin and be easily coughed up. Only use caffeine in moderation and  do not use alcohol until you have recovered from your illness.  Do not smoke. Avoid being exposed to secondhand smoke.  You play a critical role in keeping yourself in good health. Avoid exposure to things that cause you to wheeze or to have breathing problems.  Keep your medicines up-to-date and available. Carefully follow your health care provider's treatment plan.  Take your medicine exactly as prescribed.  When pollen or pollution is bad, keep windows closed and use an air conditioner or go to places with air conditioning.  Asthma requires careful medical care. See your health care provider for a follow-up as advised. If you are more than [redacted] weeks pregnant and you were prescribed any new medicines, let your obstetrician know about the visit and how you are doing. Follow up with your health care provider as directed.  After you have recovered from your asthma attack, make an appointment with your outpatient doctor to talk about ways to reduce the likelihood of future attacks. If you do not have a doctor who manages your asthma, make an appointment with a primary care doctor to discuss your asthma. SEEK IMMEDIATE MEDICAL CARE IF:   You are getting worse.  You have trouble breathing. If severe, call your local emergency services (911 in the [place]  You develop chest pain or discomfort.  You are vomiting.  You are not able to  keep fluids down.  You are coughing up yellow, green, brown, or bloody sputum.  You have a fever and your symptoms suddenly get worse.  You have trouble swallowing. MAKE SURE YOU:   Understand these instructions.  Will watch your condition.  Will get help right away if you are not doing well or get worse. Document Released: 02/24/2007 Document Revised: 11/14/2013 Document Reviewed: 05/17/2013 [facility] Patient Information 2015 [place], [place]. This information is not intended to replace advice given to you by your health care provider. Make sure you  discuss any questions you have with your health care provider.

## 2014-10-23 NOTE — Progress Notes (Signed)
Subjective:  This chart was scribed for Michelle Cheadle, MD by Donato Schultz, Medical Scribe. This patient was seen in Room 11 and the patient's care was started at 10:04 AM.   Patient ID: Michelle [name], female    DOB: 1961-04-10, 53 y.o.   MRN: [id number]  Chief Complaint  Patient presents with  . Cough    3-4 days--productive cough  . Sore Throat  . Ear Pain    both ears  . Fever    last night 101.2  . Chills  . Fatigue  . Generalized Body Aches    HPI HPI Comments: Michelle [name] is a 53 y.o. female who presents to the [facility] complaining of constant sore throat, bilateral ear pain, and cough productive of green sputum that started 4 days ago.  She had a fever of 101.2 last night that has since resolved.  She has been using Nyquil with mild relief to her symptoms.  She did not receive a flu shot this year but would like to receive one.  She has had an adverse reaction to a flu shot in the past that was characterized by SOB, upper extremity swelling, and wheezing.  She has used her inhaler twice with mild relief to her symptoms.  She endorses mild wheezing and fatigue as associated symptoms.  She denies SOB as an associated symptom.  She is allergic to Doxycycline, Bactrim, and Sulfa.     Past Medical History  Diagnosis Date  . Hypercholesteremia   . Reflux   . Gastroparesis   . VAIN I (vaginal intraepithelial neoplasia grade I) 09/2012     colposcopic biopsies 02/2012 VAIN !  . GERD (gastroesophageal reflux disease)   . Hypertension   . Osteopenia 06/2013    T score -1.2 FRAX 8.6%/0.6%  . Hypothyroidism   . Thyroid cancer     papillary  . PONV (postoperative nausea and vomiting)   . Osteoarthritis   . Asthma   . Gastroparesis     history of  . Headache(784.0)     migraines  . Seizures     1 seizure secondary to wellbutrin-none since  . Allergy   . Anaphylaxis   . Depression   . Overdose, drug 10/30/2011   Past Surgical History  Procedure  Laterality Date  . Cosmetic surgery      buttock liposuction  . Thyroid surgery      for thyroid cancer  . Nissen fundoplication  3300, 7622    REFLUX  . Hernia repair  2004, 2012  . Breast surgery      reduc tion mammoplasty  . Cholecystectomy    . Upper gastrointestinal endoscopy  08/26/2004  . Varicose vein surgery  03/20/2003    right leg  . Colposcopy  02/2012    VAIN I  . Vaginal hysterectomy  2007    irregular bleeding   Family History  Problem Relation Age of Onset  . Heart disease Mother   . Hypertension Mother   . Heart disease Father   . Hypertension Father   . Emphysema Father 85    was a smoker  . Heart disease Sister   . Hypertension Sister   . Diabetes Paternal Grandmother   . Cancer Other   . Cancer Maternal Aunt     leukemia  . Cancer Maternal Grandmother   . Heart disease Brother   . Leukemia Maternal Grandfather    History   Social History  . Marital Status: Married  Spouse Name: [name]    Number of Children: 1  . Years of Education: College   Occupational History  . homemaker    Social History Main Topics  . Smoking status: Never Smoker   . Smokeless tobacco: Never Used  . Alcohol Use: No  . Drug Use: No  . Sexual Activity:    Partners: Male    Birth Control/ Protection: Surgical   Other Topics Concern  . Not on file   Social History Narrative   Exercise: Yes.   Lives with her husband. Marital discord.   Receiving therapy with Vivia Budge.   Son lives locally.   Allergies  Allergen Reactions  . Lunesta [Eszopiclone] Other (See Comments)    Causes sleep walking events  . Nutritional Supplements Anaphylaxis  . Pneumococcal Vaccines Anaphylaxis  . Tetanus Toxoids Swelling  . Wellbutrin [Bupropion Hcl] Other (See Comments)    seizure  . Zolpidem     Causes sleep walking events  . Bactrim Hives and Other (See Comments)    Severe headache  . Clarithromycin Itching and Other (See Comments)    Severe headache  .  Doxycycline Other (See Comments)    Severe GI upset  . Hydone [Chlorthalidone] Other (See Comments)    Vasculitis   . Ranitidine Other (See Comments)    Acts like a diuretic   . Reglan [Metoclopramide] Other (See Comments)    Tremors and ticks, possible permanence of effects  . Sulfa Antibiotics Hives and Other (See Comments)    Severe headache  . Tetracyclines & Related Other (See Comments)    Severe GI upset    Review of Systems  Constitutional: Positive for fatigue.  HENT: Positive for ear pain and sore throat.   Respiratory: Positive for cough and wheezing.      Objective:  BP 126/82 mmHg  Pulse 112  Temp(Src) 98 F (36.7 C) (Oral)  Resp 18  Ht 5\' 4"  (1.626 m)  Wt 136 lb (61.689 kg)  BMI 23.33 kg/m2  SpO2 98%  LMP 05/23/2006 Physical Exam  Constitutional: She is oriented to person, place, and time. She appears well-developed and well-nourished.  HENT:  Head: Normocephalic and atraumatic.  Right Ear: Hearing, external ear and ear canal normal. A middle ear effusion is present.  Left Ear: Hearing, tympanic membrane, external ear and ear canal normal.  No middle ear effusion.  Mouth/Throat: Posterior oropharyngeal erythema present. No oropharyngeal exudate or posterior oropharyngeal edema.  Nasal mucosal erythema   Eyes: EOM are normal.  Neck: Normal range of motion. Neck supple.  Cardiovascular: Regular rhythm and normal heart sounds.  Tachycardia present.   No murmur heard. Pulmonary/Chest: Effort normal and breath sounds normal. No respiratory distress. She has no decreased breath sounds. She has no wheezes. She has no rhonchi. She has no rales.  Musculoskeletal: Normal range of motion.  Lymphadenopathy:       Head (right side): Tonsillar adenopathy present.       Head (left side): Tonsillar adenopathy present.    She has cervical adenopathy.       Right cervical: No posterior cervical adenopathy present.      Left cervical: No posterior cervical adenopathy  present.       Right: No supraclavicular adenopathy present.       Left: No supraclavicular adenopathy present.  Anterior cervical adenopathy bilaterally.    Neurological: She is alert and oriented to person, place, and time.  Skin: Skin is warm and dry.  Psychiatric: She has a normal  mood and affect. Her behavior is normal.  Nursing note and vitals reviewed.   Assessment & Plan:  Discussed a clinical suspicion of bacterial bronchitis.  Will start patient on Z-Pak, Pro-Air, and Hycet for cough which she reports she has done well on in the past.  Advised patient to return to clinic if symptoms worsen.   Acute bronchitis, unspecified organism  Asthma with acute exacerbation, mild intermittent  Meds ordered this encounter  Medications  . azithromycin (ZITHROMAX) 250 MG tablet    Sig: Take 2 tabs PO x 1 dose, then 1 tab PO QD x 4 days    Dispense:  6 tablet    Refill:  0  . HYDROcodone-acetaminophen (HYCET) 7.5-325 mg/15 ml solution    Sig: Take 5 mLs by mouth every 6 (six) hours as needed for moderate pain.    Dispense:  120 mL    Refill:  0  . predniSONE (DELTASONE) 20 MG tablet    Sig: 3 tab po qd x 2d, 2 tabs po qd x 2d, 1 tab po qd x 2d, 1/2 tab po qd x 2d    Dispense:  13 tablet    Refill:  0  . albuterol (PROVENTIL HFA;VENTOLIN HFA) 108 (90 BASE) MCG/ACT inhaler    Sig: Inhale 2 puffs into the lungs every 4 (four) hours as needed for wheezing or shortness of breath (cough, shortness of breath or wheezing.).    Dispense:  1 Inhaler    Refill:  1    I personally performed the services described in this documentation, which was scribed in my presence. The recorded information has been reviewed and considered, and addended by me as needed.  Michelle Cheadle, MD MPH

## 2014-10-24 ENCOUNTER — Other Ambulatory Visit: Payer: Self-pay

## 2014-10-24 ENCOUNTER — Telehealth: Payer: Self-pay

## 2014-10-24 MED ORDER — EPINEPHRINE 0.3 MG/0.3ML IJ SOAJ: 0.3000 mg | Freq: Once | INTRAMUSCULAR | Status: AC

## 2014-10-24 MED ORDER — EPINEPHRINE 0.3 MG/0.3ML IJ SOAJ: 0.3000 mg | Freq: Once | INTRAMUSCULAR | Status: DC

## 2014-10-24 NOTE — Telephone Encounter (Signed)
[facility] [address] req'd RF on Epipens. Dr Everlene Farrier, is it OK to RF?

## 2014-10-24 NOTE — Telephone Encounter (Signed)
Pt's pharm called. Auvi-Q has been recalled. Wanted to know if we could switch to Epi-Pen. Dr. Everlene Farrier said that was fine.

## 2014-10-26 ENCOUNTER — Telehealth: Payer: Self-pay | Admitting: Family Medicine

## 2014-10-26 NOTE — Telephone Encounter (Signed)
Pt was prescribed Hycet on 12/1.  Please advise.

## 2014-10-26 NOTE — Telephone Encounter (Signed)
Patient states that she is feeling better however she still has a lingering cough. Can she have Tussinex?  [phone]

## 2014-10-27 MED ORDER — HYDROCOD POLST-CHLORPHEN POLST 10-8 MG/5ML PO LQCR: 2.5000 mL | Freq: Two times a day (BID) | ORAL | Status: DC | PRN

## 2014-10-27 NOTE — Telephone Encounter (Signed)
We should call her and let her know to use this.

## 2014-10-27 NOTE — Telephone Encounter (Signed)
Patient would like a refill for tussonex sent to [facility] [address] Patient phone: [phone]  Also, patient states that she called yesterday (Friday 11/05/14) 5 minutes until close and she was put on hold and no one ever picked up her call. Patient stayed on hold from 6pm-8pm.

## 2014-10-27 NOTE — Telephone Encounter (Signed)
Advised pt that med is ready for pickup but she will need to return if she runs out and this is needed.

## 2014-10-27 NOTE — Telephone Encounter (Signed)
I gave patient a small portion of Tussionex.  She will need to have an OV before more refills are given.

## 2014-10-30 ENCOUNTER — Encounter: Payer: BC Managed Care – PPO | Admitting: Gynecology

## 2014-11-05 ENCOUNTER — Other Ambulatory Visit: Payer: Self-pay | Admitting: Physician Assistant

## 2014-11-05 ENCOUNTER — Other Ambulatory Visit: Payer: Self-pay | Admitting: Gynecology

## 2014-11-13 ENCOUNTER — Ambulatory Visit (INDEPENDENT_AMBULATORY_CARE_PROVIDER_SITE_OTHER): Payer: BC Managed Care – PPO | Admitting: Emergency Medicine

## 2014-11-13 ENCOUNTER — Other Ambulatory Visit: Payer: Self-pay | Admitting: Emergency Medicine

## 2014-11-13 ENCOUNTER — Ambulatory Visit (INDEPENDENT_AMBULATORY_CARE_PROVIDER_SITE_OTHER): Payer: BC Managed Care – PPO

## 2014-11-13 VITALS — BP 132/82 | HR 81 | Temp 97.6°F | Resp 16 | Ht 64.0 in

## 2014-11-13 DIAGNOSIS — R059 Cough, unspecified: Secondary | ICD-10-CM

## 2014-11-13 DIAGNOSIS — C73 Malignant neoplasm of thyroid gland: Secondary | ICD-10-CM

## 2014-11-13 DIAGNOSIS — R05 Cough: Secondary | ICD-10-CM

## 2014-11-13 DIAGNOSIS — E782 Mixed hyperlipidemia: Secondary | ICD-10-CM

## 2014-11-13 DIAGNOSIS — I10 Essential (primary) hypertension: Secondary | ICD-10-CM

## 2014-11-13 LAB — COMPLETE METABOLIC PANEL WITH GFR
ALK PHOS: 57 U/L (ref 39–117)
ALT: 44 U/L — ABNORMAL HIGH (ref 0–35)
AST: 44 U/L — ABNORMAL HIGH (ref 0–37)
Albumin: 4.6 g/dL (ref 3.5–5.2)
BILIRUBIN TOTAL: 0.9 mg/dL (ref 0.2–1.2)
BUN: 8 mg/dL (ref 6–23)
CO2: 27 meq/L (ref 19–32)
CREATININE: 0.69 mg/dL (ref 0.50–1.10)
Calcium: 9.6 mg/dL (ref 8.4–10.5)
Chloride: 103 mEq/L (ref 96–112)
GFR, Est Non African American: >89 mL/min
GLUCOSE: 93 mg/dL (ref 70–99)
Potassium: 4 mEq/L (ref 3.5–5.3)
Sodium: 141 mEq/L (ref 135–145)
TOTAL PROTEIN: 6.8 g/dL (ref 6.0–8.3)

## 2014-11-13 LAB — CBC WITH DIFFERENTIAL/PLATELET
Basophils Absolute: 0.1 10*3/uL (ref 0.0–0.1)
Basophils Relative: 1 % (ref 0–1)
Eosinophils Absolute: 0.6 10*3/uL (ref 0.0–0.7)
Eosinophils Relative: 6 % — ABNORMAL HIGH (ref 0–5)
HEMATOCRIT: 43.8 % (ref 36.0–46.0)
Hemoglobin: 15.2 g/dL — ABNORMAL HIGH (ref 12.0–15.0)
Lymphocytes Relative: 42 % (ref 12–46)
Lymphs Abs: 4 10*3/uL (ref 0.7–4.0)
MCH: 31.6 pg (ref 26.0–34.0)
MCHC: 34.7 g/dL (ref 30.0–36.0)
MCV: 91.1 fL (ref 78.0–100.0)
MONO ABS: 0.7 10*3/uL (ref 0.1–1.0)
MONOS PCT: 7 % (ref 3–12)
MPV: 9.1 fL — ABNORMAL LOW (ref 9.4–12.4)
NEUTROS ABS: 4.2 10*3/uL (ref 1.7–7.7)
Neutrophils Relative %: 44 % (ref 43–77)
Platelets: 270 10*3/uL (ref 150–400)
RBC: 4.81 MIL/uL (ref 3.87–5.11)
RDW: 13.3 % (ref 11.5–15.5)
WBC: 9.6 10*3/uL (ref 4.0–10.5)

## 2014-11-13 LAB — LIPID PANEL
CHOL/HDL RATIO: 3.9 ratio
Cholesterol: 239 mg/dL — ABNORMAL HIGH (ref 0–200)
HDL: 61 mg/dL (ref >39)
LDL Cholesterol: 125 mg/dL — ABNORMAL HIGH (ref 0–99)
TRIGLYCERIDES: 263 mg/dL — AB (ref <150)
VLDL: 53 mg/dL — AB (ref 0–40)

## 2014-11-13 LAB — T4, FREE: Free T4: 1.13 ng/dL (ref 0.80–1.80)

## 2014-11-13 LAB — TSH: TSH: 0.691 u[IU]/mL (ref 0.350–4.500)

## 2014-11-13 MED ORDER — PREDNISONE 20 MG PO TABS: ORAL_TABLET | ORAL | Status: DC

## 2014-11-13 MED ORDER — METOPROLOL SUCCINATE ER 25 MG PO TB24: ORAL_TABLET | ORAL | Status: DC

## 2014-11-13 MED ORDER — HYDROCOD POLST-CHLORPHEN POLST 10-8 MG/5ML PO LQCR: 2.5000 mL | Freq: Two times a day (BID) | ORAL | Status: DC | PRN

## 2014-11-13 NOTE — Progress Notes (Addendum)
Subjective:  This chart was scribed for Michelle Jordan, MD by [name], ED Scribe at [facility].The patient was seen in exam room 22 and the patient's care was started at 10:22 AM.   Patient ID: Michelle [name], female    DOB: 1961/11/09, 52 y.o.   MRN: [id number] Chief Complaint  Patient presents with  . Medication Refill  . Labs Only   HPI HPI Comments: Michelle [name] is a 53 y.o. female who presents to [facility] complaining of medication refill and labs  Pt was seen on 10/23/2014 by Dr. Brigitte Pulse and was diagnosed with suspected bronchitis. She was prescribed prednisone. Pt states this is an effective treatment however the prednisone makes her feel hot. Her symptoms subsided for a short period and have since returned.  Pt notes she was unable to have her scheduled colonoscopy because of the ongoing cough and treatment.  Pt also complains of fatigue and is unsure why, she believes it maybe because of the prednisone or her cough.   Patient Active Problem List   Diagnosis Date Noted  . Cough 04/02/2013  . Abnormal CXR 04/02/2013  . Leukocytosis 10/18/2012  . History of anaphylaxis 03/01/2012  . Depression 10/30/2011  . H/O suicide attempt 10/30/2011  . Hx of thyroid cancer   . Hypothyroidism   . Hypertension   . Asthma   . Gastroparesis   . Hypercholesteremia   . VAIN (vaginal intraepithelial neoplasia)   . GERD (gastroesophageal reflux disease) 07/16/2011   Past Medical History  Diagnosis Date  . Hypercholesteremia   . Reflux   . Gastroparesis   . VAIN I (vaginal intraepithelial neoplasia grade I) 09/2012     colposcopic biopsies 02/2012 VAIN !  . GERD (gastroesophageal reflux disease)   . Hypertension   . Osteopenia 06/2013    T score -1.2 FRAX 8.6%/0.6%  . Hypothyroidism   . Thyroid cancer     papillary  . PONV (postoperative nausea and vomiting)   . Osteoarthritis   . Asthma   . Gastroparesis     history of  . Headache(784.0)     migraines  .  Seizures     1 seizure secondary to wellbutrin-none since  . Allergy   . Anaphylaxis   . Depression   . Overdose, drug 10/30/2011   Past Surgical History  Procedure Laterality Date  . Cosmetic surgery      buttock liposuction  . Thyroid surgery      for thyroid cancer  . Nissen fundoplication  7544, 9201    REFLUX  . Hernia repair  2004, 2012  . Breast surgery      reduc tion mammoplasty  . Cholecystectomy    . Upper gastrointestinal endoscopy  08/26/2004  . Varicose vein surgery  03/20/2003    right leg  . Colposcopy  02/2012    VAIN I  . Vaginal hysterectomy  2007    irregular bleeding   Allergies  Allergen Reactions  . Lunesta [Eszopiclone] Other (See Comments)    Causes sleep walking events  . Nutritional Supplements Anaphylaxis  . Pneumococcal Vaccines Anaphylaxis  . Tetanus Toxoids Swelling  . Wellbutrin [Bupropion Hcl] Other (See Comments)    seizure  . Zolpidem     Causes sleep walking events  . Bactrim Hives and Other (See Comments)    Severe headache  . Clarithromycin Itching and Other (See Comments)    Severe headache  . Doxycycline Other (See Comments)    Severe GI  upset  . Hydone [Chlorthalidone] Other (See Comments)    Vasculitis   . Ranitidine Other (See Comments)    Acts like a diuretic   . Reglan [Metoclopramide] Other (See Comments)    Tremors and ticks, possible permanence of effects  . Sulfa Antibiotics Hives and Other (See Comments)    Severe headache  . Tetracyclines & Related Other (See Comments)    Severe GI upset   Prior to Admission medications   Medication Sig Start Date End Date Taking? Authorizing Provider  albuterol (PROVENTIL HFA;VENTOLIN HFA) 108 (90 BASE) MCG/ACT inhaler Inhale 2 puffs into the lungs every 4 (four) hours as needed for wheezing or shortness of breath (cough, shortness of breath or wheezing.). 10/23/14  Yes Shawnee Knapp, MD  diclofenac sodium (VOLTAREN) 1 % GEL Apply 2 g topically 4 (four) times daily.   Yes  Historical Provider, MD  EPINEPHrine 0.3 mg/0.3 mL IJ SOAJ injection Inject 0.3 mLs (0.3 mg total) into the muscle once. 10/24/14  Yes Darlyne Russian, MD  lansoprazole (PREVACID SOLUTAB) 30 MG disintegrating tablet Take 30 mg by mouth daily as needed (for stomach problems). Take 30- 60 min before your first and last meals of the day 03/30/13  Yes Tanda Rockers, MD  levothyroxine (SYNTHROID) 75 MCG tablet Take 1 tablet (75 mcg total) by mouth daily before breakfast. 09/11/14  Yes Mancel Bale, PA-C  metoprolol succinate (TOPROL-XL) 25 MG 24 hr tablet Take 1 tablet by mouth every morning. If blood pressure still elevated take 1 tab in afternoon. NO MORE REFILLS WITHOUT OFFICE VISIT - 2ND NOTICE 09/25/14  Yes Darlyne Russian, MD  MINIVELLE 0.05 MG/24HR patch APPLY ONE PATCH EXTERNALLY TWICE WEEKLY 08/31/14  Yes Timothy P Fontaine, MD  ondansetron (ZOFRAN-ODT) 8 MG disintegrating tablet DISSOLVE ONE TABLET IN MOUTH EVERY SIX TO EIGHT HOURS AS NEEDED  03/12/14  Yes Darlyne Russian, MD  rosuvastatin (CRESTOR) 20 MG tablet Take one tablet daily 02/19/14  Yes Darlyne Russian, MD  travoprost, benzalkonium, (TRAVATAN) 0.004 % ophthalmic solution Place 1 drop into both eyes at bedtime.    Yes Historical Provider, MD  valACYclovir (VALTREX) 500 MG tablet TAKE ONE TABLET BY MOUTH TWICE DAILY  11/05/14  Yes Anastasio Auerbach, MD  vitamin B-12 (CYANOCOBALAMIN) 1000 MCG tablet Take 1,000 mcg by mouth daily.     Yes Historical Provider, MD  ZETIA 10 MG tablet TAKE ONE TABLET BY MOUTH ONE TIME DAILY  11/06/14  Yes Darlyne Russian, MD   History   Social History  . Marital Status: Married    Spouse Name: [name]    Number of Children: 1  . Years of Education: College   Occupational History  . homemaker    Social History Main Topics  . Smoking status: Never Smoker   . Smokeless tobacco: Never Used  . Alcohol Use: No  . Drug Use: No  . Sexual Activity:    Partners: Male    Birth Control/ Protection: Surgical   Other  Topics Concern  . Not on file   Social History Narrative   Exercise: Yes.   Lives with her husband. Marital discord.   Receiving therapy with Vivia Budge.   Son lives locally.   Review of Systems  Constitutional: Positive for fatigue.  Respiratory: Positive for cough.       Objective:  BP 132/82 mmHg  Pulse 81  Temp(Src) 97.6 F (36.4 C)  Resp 16  Ht 5\' 4"  (1.626 m)  Wt  SpO2 100%  LMP 05/23/2006  Physical Exam  Constitutional: She is oriented to person, place, and time. She appears well-developed and well-nourished. No distress.  HENT:  Head: Normocephalic and atraumatic.  Right Ear: External ear normal.  Left Ear: External ear normal.  Nose: Nose normal.  Mouth/Throat: Oropharynx is clear and moist.  Eyes: EOM are normal.  Neck: Normal range of motion. Neck supple.  Cardiovascular: Normal rate.   Pulmonary/Chest: Effort normal. She has no wheezes. She has no rales.  Good air exchange.  Neurological: She is alert and oriented to person, place, and time.  Skin: Skin is warm and dry.  Psychiatric: She has a normal mood and affect. Her behavior is normal.  Nursing note and vitals reviewed. [facility] reading (PRIMARY) by  Dr. Everlene Farrier there are no consolidated infiltrates. Mild increased right middle lobe markings     Assessment & Plan:  She is to continue use of her inhaler. I did refill her prednisone in a taper dose. She was given 40 mL of Tussionex to last her for 4 days I told her she could call for refill. No change in other medications.I personally performed the services described in this documentation, which was scribed in my presence. The recorded information has been reviewed and is accurate.

## 2014-11-14 ENCOUNTER — Other Ambulatory Visit: Payer: Self-pay | Admitting: Emergency Medicine

## 2014-11-14 LAB — THYROGLOBULIN ANTIBODY PANEL: THYROGLOBULIN: 7 ng/mL (ref 2.8–40.9)

## 2014-11-15 ENCOUNTER — Telehealth: Payer: Self-pay | Admitting: Family Medicine

## 2014-11-15 DIAGNOSIS — R945 Abnormal results of liver function studies: Principal | ICD-10-CM

## 2014-11-15 DIAGNOSIS — R7989 Other specified abnormal findings of blood chemistry: Secondary | ICD-10-CM

## 2014-11-15 LAB — HEPATITIS B SURFACE ANTIGEN: HEP B S AG: NEGATIVE

## 2014-11-15 LAB — HEPATITIS C ANTIBODY: HCV AB: NEGATIVE

## 2014-11-15 NOTE — Telephone Encounter (Signed)
We should repeat the labs in 3-4 weeks

## 2014-11-15 NOTE — Telephone Encounter (Signed)
Patient states that she is on prednisone that's why her labs was abnormal and want to know how long it stays in her system so she can repeat her labs

## 2014-11-20 ENCOUNTER — Telehealth: Payer: Self-pay | Admitting: *Deleted

## 2014-11-20 NOTE — Telephone Encounter (Signed)
LM to advise pt.

## 2014-11-20 NOTE — Telephone Encounter (Signed)
CMP ordered- pt advised to stop in for lab only visit orders are in the system.

## 2014-11-20 NOTE — Telephone Encounter (Signed)
Pt called requesting increase on vivelle dot patch, I left message for pt to call.

## 2014-11-20 NOTE — Telephone Encounter (Signed)
Patient returning call to [facility] in regards to her having to repeat lab work in 3-4 weeks. I indicated to the patient she should come back between January 18-26 to have her labs redone. Patient stated she would come the week of January 18 either to the walk in or the appointment center. Patient requesting the lab orders to be in the system so she doesn't have to wait long or see a doctor. Patients call back number is [phone]

## 2014-11-20 NOTE — Telephone Encounter (Signed)
Please advise if future lab orders can be placed or do you want her to see you in 3-4 weeks?

## 2014-11-20 NOTE — Telephone Encounter (Signed)
We can place future orders to be done in 4 weeks

## 2014-11-21 ENCOUNTER — Telehealth: Payer: Self-pay | Admitting: Emergency Medicine

## 2014-11-21 ENCOUNTER — Emergency Department (HOSPITAL_BASED_OUTPATIENT_CLINIC_OR_DEPARTMENT_OTHER): Payer: BC Managed Care – PPO

## 2014-11-21 ENCOUNTER — Emergency Department (HOSPITAL_BASED_OUTPATIENT_CLINIC_OR_DEPARTMENT_OTHER)
Admission: EM | Admit: 2014-11-21 | Discharge: 2014-11-21 | Disposition: A | Discharge status: Home / Self Care | Payer: BC Managed Care – PPO | Attending: Emergency Medicine | Admitting: Emergency Medicine

## 2014-11-21 ENCOUNTER — Encounter (HOSPITAL_BASED_OUTPATIENT_CLINIC_OR_DEPARTMENT_OTHER): Payer: Self-pay

## 2014-11-21 DIAGNOSIS — R1013 Epigastric pain: Secondary | ICD-10-CM

## 2014-11-21 DIAGNOSIS — Z9989 Dependence on other enabling machines and devices: Secondary | ICD-10-CM | POA: Insufficient documentation

## 2014-11-21 DIAGNOSIS — Z8739 Personal history of other diseases of the musculoskeletal system and connective tissue: Secondary | ICD-10-CM | POA: Diagnosis not present

## 2014-11-21 DIAGNOSIS — R112 Nausea with vomiting, unspecified: Secondary | ICD-10-CM | POA: Diagnosis not present

## 2014-11-21 DIAGNOSIS — Z8669 Personal history of other diseases of the nervous system and sense organs: Secondary | ICD-10-CM | POA: Diagnosis not present

## 2014-11-21 DIAGNOSIS — Z791 Long term (current) use of non-steroidal anti-inflammatories (NSAID): Secondary | ICD-10-CM | POA: Insufficient documentation

## 2014-11-21 DIAGNOSIS — Z8659 Personal history of other mental and behavioral disorders: Secondary | ICD-10-CM | POA: Diagnosis not present

## 2014-11-21 DIAGNOSIS — R197 Diarrhea, unspecified: Secondary | ICD-10-CM | POA: Insufficient documentation

## 2014-11-21 DIAGNOSIS — R1110 Vomiting, unspecified: Secondary | ICD-10-CM

## 2014-11-21 DIAGNOSIS — Z79899 Other long term (current) drug therapy: Secondary | ICD-10-CM | POA: Insufficient documentation

## 2014-11-21 DIAGNOSIS — Z90710 Acquired absence of both cervix and uterus: Secondary | ICD-10-CM | POA: Insufficient documentation

## 2014-11-21 DIAGNOSIS — Z85850 Personal history of malignant neoplasm of thyroid: Secondary | ICD-10-CM | POA: Diagnosis not present

## 2014-11-21 DIAGNOSIS — E780 Pure hypercholesterolemia: Secondary | ICD-10-CM | POA: Insufficient documentation

## 2014-11-21 DIAGNOSIS — I10 Essential (primary) hypertension: Secondary | ICD-10-CM | POA: Insufficient documentation

## 2014-11-21 DIAGNOSIS — J45909 Unspecified asthma, uncomplicated: Secondary | ICD-10-CM | POA: Diagnosis not present

## 2014-11-21 DIAGNOSIS — Z8742 Personal history of other diseases of the female genital tract: Secondary | ICD-10-CM | POA: Insufficient documentation

## 2014-11-21 DIAGNOSIS — E039 Hypothyroidism, unspecified: Secondary | ICD-10-CM | POA: Diagnosis not present

## 2014-11-21 DIAGNOSIS — Z7952 Long term (current) use of systemic steroids: Secondary | ICD-10-CM | POA: Diagnosis not present

## 2014-11-21 DIAGNOSIS — K219 Gastro-esophageal reflux disease without esophagitis: Secondary | ICD-10-CM | POA: Diagnosis not present

## 2014-11-21 DIAGNOSIS — Z9089 Acquired absence of other organs: Secondary | ICD-10-CM | POA: Insufficient documentation

## 2014-11-21 LAB — CBC WITH DIFFERENTIAL/PLATELET
BASOS PCT: 0 % (ref 0–1)
BLASTS: 0 %
Band Neutrophils: 12 % — ABNORMAL HIGH (ref 0–10)
Basophils Absolute: 0 10*3/uL (ref 0.0–0.1)
EOS PCT: 7 % — AB (ref 0–5)
Eosinophils Absolute: 1.2 10*3/uL — ABNORMAL HIGH (ref 0.0–0.7)
HEMATOCRIT: 45.4 % (ref 36.0–46.0)
HEMOGLOBIN: 15.9 g/dL — AB (ref 12.0–15.0)
LYMPHS ABS: 1.7 10*3/uL (ref 0.7–4.0)
Lymphocytes Relative: 10 % — ABNORMAL LOW (ref 12–46)
MCH: 32.1 pg (ref 26.0–34.0)
MCHC: 35 g/dL (ref 30.0–36.0)
MCV: 91.7 fL (ref 78.0–100.0)
MONO ABS: 0.7 10*3/uL (ref 0.1–1.0)
MONOS PCT: 4 % (ref 3–12)
Metamyelocytes Relative: 1 %
Myelocytes: 1 %
NRBC: 0 /100{WBCs}
Neutro Abs: 13.4 10*3/uL — ABNORMAL HIGH (ref 1.7–7.7)
Neutrophils Relative %: 65 % (ref 43–77)
PLATELETS: 224 10*3/uL (ref 150–400)
Promyelocytes Absolute: 0 %
RBC: 4.95 MIL/uL (ref 3.87–5.11)
RDW: 12.9 % (ref 11.5–15.5)
WBC: 17 10*3/uL — ABNORMAL HIGH (ref 4.0–10.5)

## 2014-11-21 LAB — URINALYSIS, ROUTINE W REFLEX MICROSCOPIC
Bilirubin Urine: NEGATIVE
GLUCOSE, UA: NEGATIVE mg/dL
KETONES UR: NEGATIVE mg/dL
Leukocytes, UA: NEGATIVE
NITRITE: NEGATIVE
PROTEIN: NEGATIVE mg/dL
Specific Gravity, Urine: 1.023 (ref 1.005–1.030)
UROBILINOGEN UA: 1 mg/dL (ref 0.0–1.0)
pH: 6.5 (ref 5.0–8.0)

## 2014-11-21 LAB — COMPREHENSIVE METABOLIC PANEL
ALT: 71 U/L — AB (ref 0–35)
AST: 73 U/L — ABNORMAL HIGH (ref 0–37)
Albumin: 4.6 g/dL (ref 3.5–5.2)
Alkaline Phosphatase: 53 U/L (ref 39–117)
Anion gap: 8 (ref 5–15)
BILIRUBIN TOTAL: 1.1 mg/dL (ref 0.3–1.2)
BUN: 13 mg/dL (ref 6–23)
CALCIUM: 9.3 mg/dL (ref 8.4–10.5)
CO2: 27 mmol/L (ref 19–32)
CREATININE: 0.71 mg/dL (ref 0.50–1.10)
Chloride: 102 mEq/L (ref 96–112)
GFR calc Af Amer: >90 mL/min (ref >90)
GFR calc non Af Amer: >90 mL/min (ref >90)
Glucose, Bld: 122 mg/dL — ABNORMAL HIGH (ref 70–99)
Potassium: 4.1 mmol/L (ref 3.5–5.1)
SODIUM: 137 mmol/L (ref 135–145)
Total Protein: 7.3 g/dL (ref 6.0–8.3)

## 2014-11-21 LAB — URINE MICROSCOPIC-ADD ON

## 2014-11-21 LAB — LIPASE, BLOOD: LIPASE: 26 U/L (ref 11–59)

## 2014-11-21 MED ORDER — PROMETHAZINE HCL 25 MG/ML IJ SOLN
25.0000 mg | INTRAMUSCULAR | Status: AC
  Administered 2014-11-21: 25 mg via INTRAVENOUS

## 2014-11-21 MED ORDER — IOHEXOL 300 MG/ML  SOLN
100.0000 mL | Freq: Once | INTRAMUSCULAR | Status: AC | PRN
  Administered 2014-11-21: 100 mL via INTRAVENOUS

## 2014-11-21 MED ORDER — ONDANSETRON HCL 4 MG/2ML IJ SOLN
4.0000 mg | Freq: Once | INTRAMUSCULAR | Status: AC
  Administered 2014-11-21: 4 mg via INTRAVENOUS
  Filled 2014-11-21: qty 2

## 2014-11-21 MED ORDER — GI COCKTAIL ~~LOC~~
30.0000 mL | Freq: Once | ORAL | Status: AC
  Administered 2014-11-21: 30 mL via ORAL
  Filled 2014-11-21: qty 30

## 2014-11-21 MED ORDER — SODIUM CHLORIDE 0.9 % IV BOLUS (SEPSIS)
1000.0000 mL | Freq: Once | INTRAVENOUS | Status: AC
  Administered 2014-11-21: 1000 mL via INTRAVENOUS

## 2014-11-21 MED ORDER — PROMETHAZINE HCL 25 MG PO TABS: 25.0000 mg | ORAL_TABLET | Freq: Four times a day (QID) | ORAL | Status: DC | PRN

## 2014-11-21 MED ORDER — IOHEXOL 300 MG/ML  SOLN
25.0000 mL | Freq: Once | INTRAMUSCULAR | Status: AC | PRN
  Administered 2014-11-21: 25 mL via ORAL

## 2014-11-21 MED ORDER — PROMETHAZINE HCL 25 MG/ML IJ SOLN
25.0000 mg | Freq: Once | INTRAMUSCULAR | Status: AC
  Administered 2014-11-21: 25 mg via INTRAVENOUS
  Filled 2014-11-21: qty 1

## 2014-11-21 NOTE — ED Notes (Signed)
C/o n/v/d since 10pm after eating a family member's home

## 2014-11-21 NOTE — ED Provider Notes (Signed)
CSN: [id number]     Arrival date & time 11/21/14  1149 History   First MD Initiated Contact with Patient 11/21/14 1203     Chief Complaint  Patient presents with  . Emesis   Michelle [name] is a 53 y.o. female with history of GERD and hypertension who presents the emergency department complaining of nausea, vomiting and diarrhea associated with epigastric pain that she attributes to food poisoning. The patient reports she ate Christmas dinner at a family member's house yesterday and believes she got food poisoning.  Patient reports multiple episodes of vomiting. She reports epigastric pain due to having reflux symptoms now. Patient reports her epigastric pain to be a 10 out of 10. She reports she has attempted eating yogurt and taking prevacid without relief. She vomited 4 times today and had multiple episodes of watery diarrhea. Reports she is taking Zofran at home without relief. Patient is also taken 120 mg of Prevacid today. Patient reports that Phenergan usually works better for her. The patient's previous abdominal surgeries include a Nissen fundoplication, hysterectomy and cholecystectomy. She denies sick contacts.  The patient denies fevers, chills, melena, hematochezia, hematemesis, cough, chest pain, shortness of breath, dysuria, hematuria or sick contacts.   (Consider location/radiation/quality/duration/timing/severity/associated sxs/prior Treatment) HPI  Past Medical History  Diagnosis Date  . Hypercholesteremia   . Reflux   . Gastroparesis   . VAIN I (vaginal intraepithelial neoplasia grade I) 09/2012     colposcopic biopsies 02/2012 VAIN !  . GERD (gastroesophageal reflux disease)   . Hypertension   . Osteopenia 06/2013    T score -1.2 FRAX 8.6%/0.6%  . Hypothyroidism   . Thyroid cancer     papillary  . PONV (postoperative nausea and vomiting)   . Osteoarthritis   . Asthma   . Gastroparesis     history of  . Headache(784.0)     migraines  . Seizures     1 seizure  secondary to wellbutrin-none since  . Allergy   . Anaphylaxis   . Depression   . Overdose, drug 10/30/2011   Past Surgical History  Procedure Laterality Date  . Cosmetic surgery      buttock liposuction  . Thyroid surgery      for thyroid cancer  . Nissen fundoplication  5681, 2751    REFLUX  . Hernia repair  2004, 2012  . Breast surgery      reduc tion mammoplasty  . Cholecystectomy    . Upper gastrointestinal endoscopy  08/26/2004  . Varicose vein surgery  03/20/2003    right leg  . Colposcopy  02/2012    VAIN I  . Vaginal hysterectomy  2007    irregular bleeding   Family History  Problem Relation Age of Onset  . Heart disease Mother   . Hypertension Mother   . Heart disease Father   . Hypertension Father   . Emphysema Father 38    was a smoker  . Heart disease Sister   . Hypertension Sister   . Diabetes Paternal Grandmother   . Cancer Other   . Cancer Maternal Aunt     leukemia  . Cancer Maternal Grandmother   . Heart disease Brother   . Leukemia Maternal Grandfather    History  Substance Use Topics  . Smoking status: Never Smoker   . Smokeless tobacco: Never Used  . Alcohol Use: No   OB History    Gravida Para Term Preterm AB TAB SAB Ectopic Multiple Living   1 1  1     Review of Systems  Constitutional: Negative for fever and chills.  HENT: Negative for congestion, ear pain, sore throat and trouble swallowing.   Eyes: Negative for pain and visual disturbance.  Respiratory: Negative for cough, shortness of breath and wheezing.   Cardiovascular: Negative for chest pain, palpitations and leg swelling.  Gastrointestinal: Positive for nausea, vomiting, abdominal pain and diarrhea. Negative for blood in stool.  Genitourinary: Negative for dysuria, frequency, hematuria, flank pain, decreased urine volume, vaginal bleeding and difficulty urinating.  Musculoskeletal: Negative for back pain and neck pain.  Skin: Negative for rash and wound.   Allergic/Immunologic: Negative for food allergies.  Neurological: Negative for dizziness, weakness, numbness and headaches.  All other systems reviewed and are negative.     Allergies  Lunesta; Nutritional supplements; Pneumococcal vaccines; Tetanus toxoids; Wellbutrin; Zolpidem; Bactrim; Clarithromycin; Doxycycline; Hydone; Ranitidine; Reglan; Sulfa antibiotics; and Tetracyclines & related  Home Medications   Prior to Admission medications   Medication Sig Start Date End Date Taking? Authorizing Provider  albuterol (PROVENTIL HFA;VENTOLIN HFA) 108 (90 BASE) MCG/ACT inhaler Inhale 2 puffs into the lungs every 4 (four) hours as needed for wheezing or shortness of breath (cough, shortness of breath or wheezing.). 10/23/14   Shawnee Knapp, MD  chlorpheniramine-HYDROcodone [facility] PENNKINETIC ER) 10-8 MG/5ML LQCR Take 2.5-5 mLs by mouth every 12 (twelve) hours as needed for cough. 11/13/14   Darlyne Russian, MD  diclofenac sodium (VOLTAREN) 1 % GEL Apply 2 g topically 4 (four) times daily.    Historical Provider, MD  EPINEPHrine 0.3 mg/0.3 mL IJ SOAJ injection Inject 0.3 mLs (0.3 mg total) into the muscle once. 10/24/14   Darlyne Russian, MD  lansoprazole (PREVACID SOLUTAB) 30 MG disintegrating tablet Take 30 mg by mouth daily as needed (for stomach problems). Take 30- 60 min before your first and last meals of the day 03/30/13   Tanda Rockers, MD  levothyroxine (SYNTHROID) 75 MCG tablet Take 1 tablet (75 mcg total) by mouth daily before breakfast. 09/11/14   Mancel Bale, PA-C  metoprolol succinate (TOPROL-XL) 25 MG 24 hr tablet Take 1 tablet by mouth every morning. If blood pressure still elevated take 1 tab in afternoon 11/13/14   Darlyne Russian, MD  MINIVELLE 0.05 MG/24HR patch APPLY ONE PATCH EXTERNALLY TWICE WEEKLY 08/31/14   Anastasio Auerbach, MD  ondansetron (ZOFRAN-ODT) 8 MG disintegrating tablet DISSOLVE ONE TABLET IN MOUTH EVERY SIX TO EIGHT HOURS AS NEEDED  03/12/14   Darlyne Russian, MD   predniSONE (DELTASONE) 20 MG tablet 3 tab po qd x 2d, 2 tabs po qd x 2d, 1 tab po qd x 2d, 1/2 tab po qd x 2d 11/13/14   Darlyne Russian, MD  promethazine (PHENERGAN) 25 MG tablet Take 1 tablet (25 mg total) by mouth every 6 (six) hours as needed for nausea or vomiting. 11/21/14   Verda Cumins Lavoris Canizales, PA-C  rosuvastatin (CRESTOR) 20 MG tablet Take one tablet daily 02/19/14   Darlyne Russian, MD  travoprost, benzalkonium, (TRAVATAN) 0.004 % ophthalmic solution Place 1 drop into both eyes at bedtime.     Historical Provider, MD  valACYclovir (VALTREX) 500 MG tablet TAKE ONE TABLET BY MOUTH TWICE DAILY  11/05/14   Anastasio Auerbach, MD  vitamin B-12 (CYANOCOBALAMIN) 1000 MCG tablet Take 1,000 mcg by mouth daily.      Historical Provider, MD  ZETIA 10 MG tablet TAKE ONE TABLET BY MOUTH ONE TIME DAILY  11/14/14  Darlyne Russian, MD   BP 122/74 mmHg  Pulse 85  Temp(Src) 98.9 F (37.2 C) (Oral)  Resp 16  Ht 5\' 4"  (1.626 m)  Wt 134 lb (60.782 kg)  BMI 22.99 kg/m2  SpO2 96%  LMP 05/23/2006 Physical Exam  Constitutional: She appears well-developed and well-nourished. No distress.  HENT:  Head: Normocephalic and atraumatic.  Right Ear: External ear normal.  Left Ear: External ear normal.  Nose: Nose normal.  Mouth/Throat: Oropharynx is clear and moist. No oropharyngeal exudate.  Eyes: Conjunctivae are normal. Pupils are equal, round, and reactive to light. Right eye exhibits no discharge. Left eye exhibits no discharge.  Neck: Neck supple.  Cardiovascular: Normal rate, regular rhythm, normal heart sounds and intact distal pulses.  Exam reveals no gallop and no friction rub.   No murmur heard. Pulmonary/Chest: Effort normal and breath sounds normal. No respiratory distress. She has no wheezes. She has no rales.  Abdominal: Soft. Bowel sounds are normal. She exhibits no distension and no mass. There is tenderness. There is no rebound and no guarding.  Patient's abdomen is soft. Bowel sounds are  present. Patient has epigastric pain and is slightly tender at McBurney's point. Negative Rovsing sign.  Musculoskeletal: She exhibits no edema.  Lymphadenopathy:    She has no cervical adenopathy.  Neurological: She is alert. Coordination normal.  Skin: Skin is warm and dry. No rash noted. She is not diaphoretic. No erythema. No pallor.  Psychiatric: She has a normal mood and affect. Her behavior is normal.  Nursing note and vitals reviewed.   ED Course  Procedures (including critical care time) Labs Review Labs Reviewed  URINALYSIS, ROUTINE W REFLEX MICROSCOPIC - Abnormal; Notable for the following:    Hgb urine dipstick SMALL (*)    All other components within normal limits  URINE MICROSCOPIC-ADD ON - Abnormal; Notable for the following:    Bacteria, UA FEW (*)    All other components within normal limits  COMPREHENSIVE METABOLIC PANEL - Abnormal; Notable for the following:    Glucose, Bld 122 (*)    AST 73 (*)    ALT 71 (*)    All other components within normal limits  CBC WITH DIFFERENTIAL - Abnormal; Notable for the following:    WBC 17.0 (*)    Hemoglobin 15.9 (*)    Lymphocytes Relative 10 (*)    Eosinophils Relative 7 (*)    Band Neutrophils 12 (*)    Neutro Abs 13.4 (*)    Eosinophils Absolute 1.2 (*)    All other components within normal limits  LIPASE, BLOOD    Imaging Review Ct Abdomen Pelvis W Contrast  11/21/2014   CLINICAL DATA:  Epigastric and right lower quadrant pain. Vomiting and diarrhea. History of partial hysterectomy, cholecystectomy, and reflux.  EXAM: CT ABDOMEN AND PELVIS WITH CONTRAST  TECHNIQUE: Multidetector CT imaging of the abdomen and pelvis was performed using the standard protocol following bolus administration of intravenous contrast.  CONTRAST:  17mL OMNIPAQUE IOHEXOL 300 MG/ML SOLN, 132mL OMNIPAQUE IOHEXOL 300 MG/ML SOLN  COMPARISON:  10/12/2012  FINDINGS: Lower chest: Lung bases are unremarkable in appearance.  Upper abdomen: No focal  abnormality identified within the liver, spleen, pancreas, adrenal glands the stomach or kidneys. The ureters are normal in appearance. Gallbladder is surgically absent.  Gastrointestinal tract: There are surgical clips in the region of the gastroesophageal junction, most likely indicating previous Nissen fundoplication. Otherwise the stomach and small bowel loops are normal in appearance. The appendix is well  seen and has a normal appearance. Colonic loops are normal in appearance.  Pelvis: The uterus is surgically absent. No adnexal mass or free pelvic fluid.  Retroperitoneum: No retroperitoneal or mesenteric adenopathy. No evidence for aortic aneurysm.  Abdominal wall: Unremarkable.  Osseous structures: No suspicious lytic or blastic lesions are identified.  IMPRESSION: 1.  No evidence for acute  abnormality. 2. Status post cholecystectomy, hysterectomy. 3. Normal appendix.   Electronically Signed   By: Shon Hale M.D.   On: 11/21/2014 14:50     EKG Interpretation None      Filed Vitals:   11/21/14 1400 11/21/14 1441 11/21/14 1623 11/21/14 1710  BP: 125/92 125/62 131/80 122/74  Pulse: 79 77 86 85  Temp:   98.9 F (37.2 C)   TempSrc:   Oral   Resp:  20 16 16   Height:      Weight:      SpO2: 96% 100% 95% 96%     MDM   Meds given in ED:  Medications  sodium chloride 0.9 % bolus 1,000 mL (0 mLs Intravenous Stopped 11/21/14 1338)  promethazine (PHENERGAN) injection 25 mg (25 mg Intravenous Given 11/21/14 1232)  gi cocktail (Maalox,Lidocaine,Donnatal) (30 mLs Oral Given 11/21/14 1231)  promethazine (PHENERGAN) injection 25 mg (25 mg Intravenous Given 11/21/14 1338)  sodium chloride 0.9 % bolus 1,000 mL (0 mLs Intravenous Stopped 11/21/14 1613)  iohexol (OMNIPAQUE) 300 MG/ML solution 25 mL (25 mLs Oral Contrast Given 11/21/14 1417)  iohexol (OMNIPAQUE) 300 MG/ML solution 100 mL (100 mLs Intravenous Contrast Given 11/21/14 1418)  ondansetron (ZOFRAN) injection 4 mg (4 mg Intravenous Given  11/21/14 1612)    Discharge Medication List as of 11/21/2014  3:58 PM    START taking these medications   Details  promethazine (PHENERGAN) 25 MG tablet Take 1 tablet (25 mg total) by mouth every 6 (six) hours as needed for nausea or vomiting., Starting 11/21/2014, Until Discontinued, Print        Final diagnoses:  Nausea vomiting and diarrhea  Epigastric pain   Patient with history of hypertension, hyperlipidemia, cholecystectomy, Nissen fundoplication who presents to the emergency department complaining of epigastric abdominal pain associated with nausea, vomiting and diarrhea since 10 PM last night. Patient is concerned she is been exposed to suspicious food yesterday and believes she might have food poisoning. Patient denies any sick contacts. Patient is afebrile and nontoxic-appearing. Patient has epigastric tenderness palpation as well as some mild McBurney's point tenderness. The patient has an elevated white count at 17 with a left shift. The patient's liver enzymes are slightly elevated from her baseline. The patient's urinalysis shows small hemoglobin but is otherwise unremarkable. CT abdomen and pelvis with contrast was obtained and shows no acute abdomen. Prior to discharge the patient is tolerating by mouth liquids and reports feeling much better and ready to be discharged. I will provide a prescription for Phenergan for use at home.  Advised patient to follow-up with her primary care provider next week. Strict return precautions were provided. Advised patient return to the emergency department with new or worsening symptoms or new concerns. Patient verbalized understanding and agreement with plan.  This patient was discussed with and evaluated by Dr. Leonides Schanz who agrees with assessment and plan.     Hanley Hays, PA-C 11/21/14 2203

## 2014-11-21 NOTE — Telephone Encounter (Signed)
Patient's husband states that his wife has food poisoning and she is having terrible upset stomach. He is requesting a a GI Cocktail for patient. Please call Shikira Folino at [phone]

## 2014-11-21 NOTE — Discharge Instructions (Signed)
Abdominal Pain Many things can cause abdominal pain. Usually, abdominal pain is not caused by a disease and will improve without treatment. It can often be observed and treated at home. Your health care provider will do a physical exam and possibly order blood tests and X-rays to help determine the seriousness of your pain. However, in many cases, more time must pass before a clear cause of the pain can be found. Before that point, your health care provider may not know if you need more testing or further treatment. HOME CARE INSTRUCTIONS  Monitor your abdominal pain for any changes. The following actions may help to alleviate any discomfort you are experiencing:  Only take over-the-counter or prescription medicines as directed by your health care provider.  Do not take laxatives unless directed to do so by your health care provider.  Try a clear liquid diet (broth, tea, or water) as directed by your health care provider. Slowly move to a bland diet as tolerated. SEEK MEDICAL CARE IF:  You have unexplained abdominal pain.  You have abdominal pain associated with nausea or diarrhea.  You have pain when you urinate or have a bowel movement.  You experience abdominal pain that wakes you in the night.  You have abdominal pain that is worsened or improved by eating food.  You have abdominal pain that is worsened with eating fatty foods.  You have a fever. SEEK IMMEDIATE MEDICAL CARE IF:   Your pain does not go away within 2 hours.  You keep throwing up (vomiting).  Your pain is felt only in portions of the abdomen, such as the right side or the left lower portion of the abdomen.  You pass bloody or black tarry stools. MAKE SURE YOU:  Understand these instructions.   Will watch your condition.   Will get help right away if you are not doing well or get worse.  Document Released: 08/19/2005 Document Revised: 11/14/2013 Document Reviewed: 07/19/2013 [facility] Patient Information  2015 [place], [place]. This information is not intended to replace advice given to you by your health care provider. Make sure you discuss any questions you have with your health care provider. Food Poisoning Food poisoning is an illness caused by something you ate or drank. There are over 250 known causes of food poisoning. However, many other causes are unknown.You can be treated even if the exact cause of your food poisoning is not known. In most cases, food poisoning is mild and lasts 1 to 2 days. However, some cases can be serious, especially for people with low immune systems, the elderly, children and infants, and pregnant women. CAUSES  Poor personal hygiene, improper cleaning of storage and preparation areas, and unclean utensils can cause infection or tainting (contamination) of foods. The causes of food poisoning are numerous.Infectious agents, such as viruses, bacteria, or parasites, can cause harm by infecting the intestine and disrupting the absorption of nutrients and water. This can cause diarrhea and lead to dehydration. Viruses are responsible for most of the food poisonings in which an agent is found. Parasites are less likely to cause food poisoning. Toxic agents, such as poisonous mushrooms, marine algae, and pesticides can also cause food poisoning.  Viral causes of food poisoning include:  Norovirus.  Rotavirus.  Hepatitis A.  Bacterial causes of food poisoning include:  Salmonellae.  Campylobacter.  Bacillus cereus.  Escherichia coli (E. coli).  Shigella.  Listeria monocytogenes.  Clostridium botulinum (botulism).  Vibrio cholerae.  Parasites that can cause food poisoning include:  Giardia.  Cryptosporidium.  Toxoplasma. SYMPTOMS Symptoms may appear several hours or longer after consuming the contaminated food or drink. Symptoms may include:  Nausea.  Vomiting.  Cramping.  Diarrhea.  Fever and chills.  Muscle aches. DIAGNOSIS Your health  care provider may be able to diagnose food poisoning from a list of what you have recently eaten and results from lab tests. Diagnostic tests may include an exam of the feces. TREATMENT In most cases, treatment focuses on helping to relieve your symptoms and staying well hydrated. Antibiotic medicines are rarely needed. In severe cases, hospitalization may be required. HOME CARE INSTRUCTIONS   Drink enough water and fluids to keep your urine clear or pale yellow. Drink small amounts of fluids frequently and increase as tolerated.  Ask your health care provider for specific rehydration instructions.  Avoid:  Foods high in sugar.  Alcohol.  Carbonated drinks.  Tobacco.  Juice.  Caffeine drinks.  Extremely hot or cold fluids.  Fatty, greasy foods.  Too much intake of anything at one time.  Dairy products until 24 to 48 hours after diarrhea stops.  You may consume probiotics. Probiotics are active cultures of beneficial bacteria. They may lessen the amount and number of diarrheal stools in adults. Probiotics can be found in yogurt with active cultures and in supplements.  Wash your hands well to avoid spreading the bacteria.  Take medicines only as directed by your health care provider. Do not give your child aspirin because of the association with Reye's syndrome.  Ask your health care provider if you should continue to take your regular prescribed and over-the-counter medicines. PREVENTION   Wash your hands, food preparation surfaces, and utensils thoroughly before and after handling raw foods.  Keep refrigerated foods below 33F (5C).  Serve hot foods immediately or keep them heated above 133F (60C).  Divide large volumes of food into small portions for rapid cooling in the refrigerator. Hot, bulky foods in the refrigerator can raise the temperature of other foods that have already cooled.  Follow approved canning procedures.  Heat canned foods thoroughly before  tasting.  When in doubt, throw it out.  Infants, the elderly, women who are pregnant, and people with compromised immune systems are especially susceptible to food poisoning. These people should never consume unpasteurized cheese, unpasteurized cider, raw fish, raw seafood, or raw meat-type products. SEEK IMMEDIATE MEDICAL CARE IF:   You have difficulty breathing, swallowing, talking, or moving.  You develop blurred vision.  You are unable to keep fluids down.  You faint or nearly faint.  Your eyes turn yellow.  Vomiting or diarrhea develops or becomes persistent.  Abdominal pain develops, increases, or localizes in one small area.  You have a fever.  The diarrhea becomes excessive or contains blood or mucus.  You develop excessive weakness, dizziness, or extreme thirst.  You have no urine for 8 hours. MAKE SURE YOU:   Understand these instructions.  Will watch your condition.  Will get help right away if you are not doing well or get worse. Document Released: 08/07/2004 Document Revised: 03/26/2014 Document Reviewed: 03/26/2011 [facility] Patient Information 2015 [place], [place]. This information is not intended to replace advice given to you by your health care provider. Make sure you discuss any questions you have with your health care provider.

## 2014-11-21 NOTE — ED Provider Notes (Signed)
Medical screening examination/treatment/procedure(s) were conducted as a shared visit with non-physician practitioner(s) and myself.  I personally evaluated the patient during the encounter.   EKG Interpretation None      Pt is a 53 y.o. F with history of hypertension, hyperlipidemia, prior cholecystectomy who presents the emergency department with abdominal pain in the epigastric and right upper quadrant region as well as slightly in the right lower quadrant that started yesterday with vomiting. She is concerned that she may have been exposed to bad food recently over the holidays. On exam she has a positive Murphy sign is slightly tender at McBurney's point. She has a leukocytosis of 17 with left shift. Urine shows small hemoglobin and few bacteria but no other sign of infection. AST and ALTs are slightly elevated from baseline.  CT scan however shows no acute abdomen. She is status post cholecystectomy and hysterectomy and has a normal appendix. Able to tolerate by mouth and is nontoxic. This may have been a viral illness versus food exposure. Have discussed with her strict return precautions. We will discharge home.  Muddy, DO 11/21/14 1515

## 2014-11-21 NOTE — ED Notes (Signed)
Pt reports she feels much better and requesting to go home.

## 2014-11-21 NOTE — Telephone Encounter (Signed)
No answer on number provided. Called pt cell number- Lm for pt to RTC for symptoms. We have not evaluated her for this.

## 2014-11-21 NOTE — ED Notes (Signed)
Pt drank all of can of sprite for po challenge, but states that she still feels nausea.

## 2014-11-21 NOTE — ED Notes (Signed)
Pt resting, states that she still feels nauseous, and does not feel like she can go home at this time.

## 2014-11-21 NOTE — ED Notes (Signed)
IV #20 Right AC removed with cath intact, new dressing applied.

## 2014-11-22 ENCOUNTER — Telehealth: Payer: Self-pay

## 2014-11-22 NOTE — Telephone Encounter (Signed)
Call patient and be sure she follows up here or with Dr. Benson Norway if she continues to have symptoms.     ----- Message -----     From: SYSTEM     Sent: 11/21/2014  5:14 PM      To: Darlyne Russian, MD     Called pt. She is doing somewhat better. Will follow up here or with Dr. Benson Norway if she doesn't get well

## 2014-12-04 ENCOUNTER — Other Ambulatory Visit (HOSPITAL_COMMUNITY)
Admission: RE | Admit: 2014-12-04 | Discharge: 2014-12-04 | Disposition: A | Discharge status: Home / Self Care | Payer: BLUE CROSS/BLUE SHIELD | Source: Ambulatory Visit | Attending: Gynecology | Admitting: Gynecology

## 2014-12-04 ENCOUNTER — Ambulatory Visit (INDEPENDENT_AMBULATORY_CARE_PROVIDER_SITE_OTHER): Payer: BLUE CROSS/BLUE SHIELD | Admitting: Gynecology

## 2014-12-04 ENCOUNTER — Encounter: Payer: Self-pay | Admitting: Gynecology

## 2014-12-04 ENCOUNTER — Encounter: Payer: BC Managed Care – PPO | Admitting: Gynecology

## 2014-12-04 VITALS — BP 138/88 | Ht 64.0 in | Wt 136.0 lb

## 2014-12-04 DIAGNOSIS — M8580 Other specified disorders of bone density and structure, unspecified site: Secondary | ICD-10-CM

## 2014-12-04 DIAGNOSIS — Z1151 Encounter for screening for human papillomavirus (HPV): Secondary | ICD-10-CM | POA: Diagnosis present

## 2014-12-04 DIAGNOSIS — R87810 Cervical high risk human papillomavirus (HPV) DNA test positive: Secondary | ICD-10-CM | POA: Diagnosis present

## 2014-12-04 DIAGNOSIS — N951 Menopausal and female climacteric states: Secondary | ICD-10-CM

## 2014-12-04 DIAGNOSIS — Z01411 Encounter for gynecological examination (general) (routine) with abnormal findings: Secondary | ICD-10-CM | POA: Insufficient documentation

## 2014-12-04 DIAGNOSIS — N893 Dysplasia of vagina, unspecified: Secondary | ICD-10-CM

## 2014-12-04 DIAGNOSIS — Z01419 Encounter for gynecological examination (general) (routine) without abnormal findings: Secondary | ICD-10-CM

## 2014-12-04 MED ORDER — ESTRADIOL 0.075 MG/24HR TD PTTW: 1.0000 | MEDICATED_PATCH | TRANSDERMAL | Status: DC

## 2014-12-04 NOTE — Patient Instructions (Signed)
Call to Schedule your mammogram  Facilities in [place]: 1)  [facility], [address] Phone: [phone] 2)  [facility]. [address] [place] Phone: [phone] 3)  Dr. Isaiah Blakes at [facility] [address] Phone: [phone]     Mammogram A mammogram is an X-ray test to find changes in a woman's breast. You should get a mammogram if:  You are 55 years of age or older  You have risk factors.   Your doctor recommends that you have one.  BEFORE THE TEST  Do not schedule the test the week before your period, especially if your breasts are sore during this time.  On the day of your mammogram:  Wash your breasts and armpits well. After washing, do not put on any deodorant or talcum powder on until after your test.   Eat and drink as you usually do.   Take your medicines as usual.   If you are diabetic and take insulin, make sure you:   Eat before coming for your test.   Take your insulin as usual.   If you cannot keep your appointment, call before the appointment to cancel. Schedule another appointment.  TEST  You will need to undress from the waist up. You will put on a hospital gown.   Your breast will be put on the mammogram machine, and it will press firmly on your breast with a piece of plastic called a compression paddle. This will make your breast flatter so that the machine can X-ray all parts of your breast.   Both breasts will be X-rayed. Each breast will be X-rayed from above and from the side. An X-ray might need to be taken again if the picture is not good enough.   The mammogram will last about 15 to 30 minutes.  AFTER THE TEST Finding out the results of your test Ask when your test results will be ready. Make sure you get your test results.  Document Released: 02/05/2009 Document Revised: 10/29/2011 Document Reviewed: 02/05/2009 [facility] Patient  Information 2012 [place].  You may obtain a copy of any labs that were done today by logging onto MyChart as outlined in the instructions provided with your AVS (after visit summary). The office will not call with normal lab results but certainly if there are any significant abnormalities then we will contact you.   Health Maintenance, Female A healthy lifestyle and preventative care can promote health and wellness.  Maintain regular health, dental, and eye exams.  Eat a healthy diet. Foods like vegetables, fruits, whole grains, low-fat dairy products, and lean protein foods contain the nutrients you need without too many calories. Decrease your intake of foods high in solid fats, added sugars, and salt. Get information about a proper diet from your caregiver, if necessary.  Regular physical exercise is one of the most important things you can do for your health. Most adults should get at least 150 minutes of moderate-intensity exercise (any activity that increases your heart rate and causes you to sweat) each week. In addition, most adults need muscle-strengthening exercises on 2 or more days a week.   Maintain a healthy weight. The body mass index (BMI) is a screening tool to identify possible weight problems. It provides an estimate of body fat based on height and weight. Your caregiver can help determine your BMI, and can help you achieve or maintain a healthy weight. For adults  20 years and older:  A BMI below 18.5 is considered underweight.  A BMI of 18.5 to 24.9 is normal.  A BMI of 25 to 29.9 is considered overweight.  A BMI of 30 and above is considered obese.  Maintain normal blood lipids and cholesterol by exercising and minimizing your intake of saturated fat. Eat a balanced diet with plenty of fruits and vegetables. Blood tests for lipids and cholesterol should begin at age 20 and be repeated every 5 years. If your lipid or cholesterol levels are high, you are over 50, or  you are a high risk for heart disease, you may need your cholesterol levels checked more frequently.Ongoing high lipid and cholesterol levels should be treated with medicines if diet and exercise are not effective.  If you smoke, find out from your caregiver how to quit. If you do not use tobacco, do not start.  Lung cancer screening is recommended for adults aged 55 80 years who are at high risk for developing lung cancer because of a history of smoking. Yearly low-dose computed tomography (CT) is recommended for people who have at least a 30-pack-year history of smoking and are a current smoker or have quit within the past 15 years. A pack year of smoking is smoking an average of 1 pack of cigarettes a day for 1 year (for example: 1 pack a day for 30 years or 2 packs a day for 15 years). Yearly screening should continue until the smoker has stopped smoking for at least 15 years. Yearly screening should also be stopped for people who develop a health problem that would prevent them from having lung cancer treatment.  If you are pregnant, do not drink alcohol. If you are breastfeeding, be very cautious about drinking alcohol. If you are not pregnant and choose to drink alcohol, do not exceed 1 drink per day. One drink is considered to be 12 ounces (355 mL) of beer, 5 ounces (148 mL) of wine, or 1.5 ounces (44 mL) of liquor.  Avoid use of street drugs. Do not share needles with anyone. Ask for help if you need support or instructions about stopping the use of drugs.  High blood pressure causes heart disease and increases the risk of stroke. Blood pressure should be checked at least every 1 to 2 years. Ongoing high blood pressure should be treated with medicines, if weight loss and exercise are not effective.  If you are 55 to 55 years old, ask your caregiver if you should take aspirin to prevent strokes.  Diabetes screening involves taking a blood sample to check your fasting blood sugar level. This  should be done once every 3 years, after age 45, if you are within normal weight and without risk factors for diabetes. Testing should be considered at a younger age or be carried out more frequently if you are overweight and have at least 1 risk factor for diabetes.  Breast cancer screening is essential preventative care for women. You should practice "breast self-awareness." This means understanding the normal appearance and feel of your breasts and may include breast self-examination. Any changes detected, no matter how small, should be reported to a caregiver. Women in their 20s and 30s should have a clinical breast exam (CBE) by a caregiver as part of a regular health exam every 1 to 3 years. After age 40, women should have a CBE every year. Starting at age 40, women should consider having a mammogram (breast X-ray) every year. Women who have a   family history of breast cancer should talk to their caregiver about genetic screening. Women at a high risk of breast cancer should talk to their caregiver about having an MRI and a mammogram every year.  Breast cancer gene (BRCA)-related cancer risk assessment is recommended for women who have family members with BRCA-related cancers. BRCA-related cancers include breast, ovarian, tubal, and peritoneal cancers. Having family members with these cancers may be associated with an increased risk for harmful changes (mutations) in the breast cancer genes BRCA1 and BRCA2. Results of the assessment will determine the need for genetic counseling and BRCA1 and BRCA2 testing.  The Pap test is a screening test for cervical cancer. Women should have a Pap test starting at age 33. Between ages 75 and 14, Pap tests should be repeated every 2 years. Beginning at age 4, you should have a Pap test every 3 years as long as the past 3 Pap tests have been normal. If you had a hysterectomy for a problem that was not cancer or a condition that could lead to cancer, then you no longer  need Pap tests. If you are between ages 72 and 12, and you have had normal Pap tests going back 10 years, you no longer need Pap tests. If you have had past treatment for cervical cancer or a condition that could lead to cancer, you need Pap tests and screening for cancer for at least 20 years after your treatment. If Pap tests have been discontinued, risk factors (such as a new sexual partner) need to be reassessed to determine if screening should be resumed. Some women have medical problems that increase the chance of getting cervical cancer. In these cases, your caregiver may recommend more frequent screening and Pap tests.  The human papillomavirus (HPV) test is an additional test that may be used for cervical cancer screening. The HPV test looks for the virus that can cause the cell changes on the cervix. The cells collected during the Pap test can be tested for HPV. The HPV test could be used to screen women aged 84 years and older, and should be used in women of any age who have unclear Pap test results. After the age of 73, women should have HPV testing at the same frequency as a Pap test.  Colorectal cancer can be detected and often prevented. Most routine colorectal cancer screening begins at the age of 56 and continues through age 76. However, your caregiver may recommend screening at an earlier age if you have risk factors for colon cancer. On a yearly basis, your caregiver may provide home test kits to check for hidden blood in the stool. Use of a small camera at the end of a tube, to directly examine the colon (sigmoidoscopy or colonoscopy), can detect the earliest forms of colorectal cancer. Talk to your caregiver about this at age 20, when routine screening begins. Direct examination of the colon should be repeated every 5 to 10 years through age 83, unless early forms of pre-cancerous polyps or small growths are found.  Hepatitis C blood testing is recommended for all people born from [birth year]  through 1965 and any individual with known risks for hepatitis C.  Practice safe sex. Use condoms and avoid high-risk sexual practices to reduce the spread of sexually transmitted infections (STIs). Sexually active women aged 28 and younger should be checked for Chlamydia, which is a common sexually transmitted infection. Older women with new or multiple partners should also be tested for Chlamydia. Testing for  other STIs is recommended if you are sexually active and at increased risk.  Osteoporosis is a disease in which the bones lose minerals and strength with aging. This can result in serious bone fractures. The risk of osteoporosis can be identified using a bone density scan. Women ages 35 and over and women at risk for fractures or osteoporosis should discuss screening with their caregivers. Ask your caregiver whether you should be taking a calcium supplement or vitamin D to reduce the rate of osteoporosis.  Menopause can be associated with physical symptoms and risks. Hormone replacement therapy is available to decrease symptoms and risks. You should talk to your caregiver about whether hormone replacement therapy is right for you.  Use sunscreen. Apply sunscreen liberally and repeatedly throughout the day. You should seek shade when your shadow is shorter than you. Protect yourself by wearing long sleeves, pants, a wide-brimmed hat, and sunglasses year round, whenever you are outdoors.  Notify your caregiver of new moles or changes in moles, especially if there is a change in shape or color. Also notify your caregiver if a mole is larger than the size of a pencil eraser.  Stay current with your immunizations. Document Released: 05/25/2011 Document Revised: 03/06/2013 Document Reviewed: 05/25/2011 [facility] Patient Information 2014 [place].

## 2014-12-04 NOTE — Progress Notes (Signed)
Michelle [name] [birth year] [id number]        54 y.o.  G1P1 for annual exam.   Several issues noted below.  Past medical history,surgical history, problem list, medications, allergies, family history and social history were all reviewed and documented as reviewed in the EPIC chart.  ROS:  Performed with pertinent positives and negatives included in the history, assessment and plan.   Additional significant findings :  none   Exam: Leanne Lovely Vitals:   12/04/14 1459  BP: 138/88  Height: 5\' 4"  (1.626 m)  Weight: 136 lb (61.689 kg)   General appearance:  Normal affect, orientation and appearance. Skin: Grossly normal HEENT: Without gross lesions.  No cervical or supraclavicular adenopathy. Thyroid normal.  Lungs:  Clear without wheezing, rales or rhonchi Cardiac: RR, without RMG Abdominal:  Soft, nontender, without masses, guarding, rebound, organomegaly or hernia Breasts:  Examined lying and sitting without masses, retractions, discharge or axillary adenopathy. Pelvic:  Ext/BUS/vagina normal with mild generalized atrophic changes. Pap smear/HPV of vaginal cuff done  Adnexa  Without masses or tenderness    Anus and perineum  Normal   Rectovaginal  Normal sphincter tone without palpated masses or tenderness. Bilateral reduction scars noted   Assessment/Plan:  54 y.o. G1P1 female for annual exam.   1. Postmenopausal/atrophic genital changes/menopausal symptoms. Status post [facility] 2007 for irregular bleeding. On MiniVivelle 0.05 mg patches. Notes worsening hot flashes and night sweats on the patch. I again reviewed the issues and risks of ERT to include the WHI study with increased risk of stroke heart attack DVT and breast cancer. Will increase to 0.075 mg patch to see if we can control her symptoms that she is finding unacceptable. Patient understands the risks and agrees with the increased strength. Refill 1 year provided. Will call if she does not get adequate  relief. 2. VAIN 1.  History of VAIN 1 2011 following her vaginal hysterectomy for benign indications with no history of abnormal Pap smears previously. Colposcopy with biopsy showed VAIN 1. Subsequent colposcopy with biopsy 2013 showed VAIN 1. Most recent Pap smear 2014 showed VAIN 1 with positive high-risk HPV negative subtype 16/18. Colposcopy was normal and no biopsies done. Pap smear and HPV screen done today. Triage based on results. 3. Osteopenia.  DEXA 06/2013 T score -1.2. FRAX 8.6%/0.6%. Increased calcium vitamin D reviewed. Plan repeat DEXA in several years. 4. Mammography 10/2012. Patient knows she is overdue and agrees to schedule. SBE monthly reviewed. 5. Colonoscopy 8 years ago with planned repeat at 10 year interval. 6. Health maintenance. No routine blood work done and she reports this done at her primary physician's office. Blood pressure mildly elevated at 138/88. Recommend repeat in non-exam situation and follow up if continues elevated. Follow up in one year assuming she continues well, sooner as needed.     Anastasio Auerbach MD, 3:24 PM 12/04/2014

## 2014-12-04 NOTE — Addendum Note (Signed)
Addended by: Burnett Kanaris on: 12/04/2014 03:35 PM   Modules accepted: Orders

## 2014-12-06 LAB — CYTOLOGY - PAP

## 2014-12-07 ENCOUNTER — Encounter: Payer: Self-pay | Admitting: Gynecology

## 2014-12-11 ENCOUNTER — Other Ambulatory Visit: Payer: BLUE CROSS/BLUE SHIELD

## 2014-12-13 ENCOUNTER — Telehealth: Payer: Self-pay | Admitting: Family Medicine

## 2014-12-13 NOTE — Telephone Encounter (Signed)
Spoke with patient Michelle [name] ask her if she can RTC on Monday to get blood redrawn the order was never done she states  that Sharyn Lull drew her blood that Tuesday morning patient was calm about returning back to get blood drawn

## 2014-12-17 ENCOUNTER — Other Ambulatory Visit (INDEPENDENT_AMBULATORY_CARE_PROVIDER_SITE_OTHER): Payer: BLUE CROSS/BLUE SHIELD | Admitting: Family Medicine

## 2014-12-17 ENCOUNTER — Telehealth: Payer: Self-pay | Admitting: Family Medicine

## 2014-12-17 DIAGNOSIS — R748 Abnormal levels of other serum enzymes: Secondary | ICD-10-CM

## 2014-12-17 DIAGNOSIS — R945 Abnormal results of liver function studies: Secondary | ICD-10-CM

## 2014-12-17 DIAGNOSIS — R7989 Other specified abnormal findings of blood chemistry: Secondary | ICD-10-CM

## 2014-12-17 LAB — COMPLETE METABOLIC PANEL WITH GFR
ALBUMIN: 4.2 g/dL (ref 3.5–5.2)
ALK PHOS: 51 U/L (ref 39–117)
ALT: 42 U/L — AB (ref 0–35)
AST: 35 U/L (ref 0–37)
BILIRUBIN TOTAL: 0.8 mg/dL (ref 0.2–1.2)
BUN: 11 mg/dL (ref 6–23)
CALCIUM: 9.4 mg/dL (ref 8.4–10.5)
CO2: 26 meq/L (ref 19–32)
CREATININE: 0.56 mg/dL (ref 0.50–1.10)
Chloride: 104 mEq/L (ref 96–112)
GFR, Est Non African American: >89 mL/min
Glucose, Bld: 82 mg/dL (ref 70–99)
Potassium: 3.8 mEq/L (ref 3.5–5.3)
Sodium: 139 mEq/L (ref 135–145)
TOTAL PROTEIN: 6.4 g/dL (ref 6.0–8.3)

## 2014-12-17 LAB — CBC WITH DIFFERENTIAL/PLATELET
BASOS PCT: 0 % (ref 0–1)
Basophils Absolute: 0 10*3/uL (ref 0.0–0.1)
Eosinophils Absolute: 0.4 10*3/uL (ref 0.0–0.7)
Eosinophils Relative: 4 % (ref 0–5)
HCT: 39.4 % (ref 36.0–46.0)
Hemoglobin: 13.7 g/dL (ref 12.0–15.0)
Lymphocytes Relative: 42 % (ref 12–46)
Lymphs Abs: 4.2 10*3/uL — ABNORMAL HIGH (ref 0.7–4.0)
MCH: 31.4 pg (ref 26.0–34.0)
MCHC: 34.8 g/dL (ref 30.0–36.0)
MCV: 90.2 fL (ref 78.0–100.0)
MONO ABS: 0.8 10*3/uL (ref 0.1–1.0)
MPV: 9 fL (ref 8.6–12.4)
Monocytes Relative: 8 % (ref 3–12)
Neutro Abs: 4.6 10*3/uL (ref 1.7–7.7)
Neutrophils Relative %: 46 % (ref 43–77)
PLATELETS: 244 10*3/uL (ref 150–400)
RBC: 4.37 MIL/uL (ref 3.87–5.11)
RDW: 13.2 % (ref 11.5–15.5)
WBC: 10 10*3/uL (ref 4.0–10.5)

## 2014-12-17 LAB — CK: CK TOTAL: 117 U/L (ref 7–177)

## 2014-12-17 NOTE — Telephone Encounter (Signed)
Patient would like a RX for NasoNex she will be going out of town tomorrow please call on cell phone

## 2014-12-17 NOTE — Telephone Encounter (Deleted)
Patient would like to get a refill

## 2014-12-23 NOTE — Telephone Encounter (Signed)
Patient states she is returning a call to [facility] regarding her "nasonex". Per patient Dr. Everlene Farrier prescribed it for her maybe 3 years ago. Patient was upset stated she had been trying to get his filled since last week.  She is requesting it to be called in to [facility] at [address]. She is requesting someone to call her back today at [phone]

## 2014-12-24 ENCOUNTER — Other Ambulatory Visit: Payer: Self-pay | Admitting: Emergency Medicine

## 2014-12-24 MED ORDER — MOMETASONE FUROATE 50 MCG/ACT NA SUSP: NASAL | Status: DC

## 2014-12-24 NOTE — Telephone Encounter (Signed)
Called pt to let her know Dr. Everlene Farrier sent in her medication. Pt understood.

## 2014-12-24 NOTE — Telephone Encounter (Signed)
Okay to call and  nasonex 2 puffs each side of her nose once daily dispense #1 with 11 refills

## 2014-12-26 ENCOUNTER — Ambulatory Visit (INDEPENDENT_AMBULATORY_CARE_PROVIDER_SITE_OTHER): Payer: BLUE CROSS/BLUE SHIELD | Admitting: Gynecology

## 2014-12-26 ENCOUNTER — Encounter: Payer: Self-pay | Admitting: Gynecology

## 2014-12-26 DIAGNOSIS — N890 Mild vaginal dysplasia: Secondary | ICD-10-CM

## 2014-12-26 NOTE — Addendum Note (Signed)
Addended by: Anastasio Auerbach on: 12/26/2014 04:08 PM   Modules accepted: Orders

## 2014-12-26 NOTE — Patient Instructions (Signed)
Office will call you with biopsy results 

## 2014-12-26 NOTE — Progress Notes (Signed)
Michelle [name] 23-May-1961 [id number]        54 y.o.  G1P1 Patient presents for colposcopy.  History of vaginal hysterectomy for benign indications with no history of abnormal Pap smears previously. Subsequently developed a low-grade vaginal dysplasia in 2011 with colposcopy biopsy showing VAIN 1. Follow up colposcopic biopsy 02/2012 also showed VAIN 1.  Pap smear 05/2013 LGSIL with positive high-risk HPV negative subtypes 16/18 and colposcopy was normal with no biopsies. Pap smear 11/2014 showed LGSIL with positive high-risk HPV  Past medical history,surgical history, problem list, medications, allergies, family history and social history were all reviewed and documented in the EPIC chart.  Directed ROS with pertinent positives and negatives documented in the history of present illness/assessment and plan.  Exam: Kim assistant General appearance:  Normal External BUS vagina with mild atrophic changes. No gross abnormalities. Digital exam with no palpable abnormalities. Bimanual without masses or tenderness.  Colposcopy after acetic acid cleanse showed patch of acetowhite change upper right posterior cuff. Representative biopsy taken. Patient tolerated well  Assessment/Plan:  54 y.o. G1P1 with persistent low-grade vaginal dysplasia and positive high-risk HPV. Colposcopy today shows area of acetowhite change upper right posterior vaginal cuff. Biopsy taken. Patient will follow up for results. I again reviewed the issues of dysplasia, high-grade/low-grade, regression/progression and the HPV association.     Anastasio Auerbach MD, 3:27 PM 12/26/2014

## 2015-01-04 ENCOUNTER — Other Ambulatory Visit: Payer: Self-pay

## 2015-01-04 DIAGNOSIS — B001 Herpesviral vesicular dermatitis: Secondary | ICD-10-CM

## 2015-01-04 MED ORDER — ACYCLOVIR 5 % EX OINT: 1.0000 "application " | TOPICAL_OINTMENT | CUTANEOUS | Status: DC

## 2015-01-04 NOTE — Telephone Encounter (Signed)
Dr Everlene Farrier, pharm sent req for RFs of acyclovir ointment pt uses for HSV 1. Can we give RFs? Pended as written before, only I could not get EPIC to recognize under drug name so put in as other.

## 2015-02-06 ENCOUNTER — Other Ambulatory Visit: Payer: Self-pay | Admitting: Emergency Medicine

## 2015-03-08 ENCOUNTER — Other Ambulatory Visit: Payer: Self-pay | Admitting: Physician Assistant

## 2015-04-25 ENCOUNTER — Telehealth: Payer: Self-pay | Admitting: Family Medicine

## 2015-04-25 NOTE — Telephone Encounter (Signed)
Patient is in need of inhaler NOW.   She is willing to come in but needs to know NOW.    [phone] (H)

## 2015-04-25 NOTE — Telephone Encounter (Signed)
Rx sent, called pt to let her know. Left message.

## 2015-04-30 ENCOUNTER — Encounter: Payer: Self-pay | Admitting: *Deleted

## 2015-05-01 ENCOUNTER — Other Ambulatory Visit: Payer: Self-pay | Admitting: Gynecology

## 2015-05-20 ENCOUNTER — Other Ambulatory Visit: Payer: Self-pay | Admitting: Emergency Medicine

## 2015-05-20 DIAGNOSIS — Z1231 Encounter for screening mammogram for malignant neoplasm of breast: Secondary | ICD-10-CM

## 2015-05-21 ENCOUNTER — Ambulatory Visit (HOSPITAL_COMMUNITY)
Admission: RE | Admit: 2015-05-21 | Discharge: 2015-05-21 | Disposition: A | Discharge status: Home / Self Care | Payer: BLUE CROSS/BLUE SHIELD | Source: Ambulatory Visit | Attending: Emergency Medicine | Admitting: Emergency Medicine

## 2015-05-21 DIAGNOSIS — Z1231 Encounter for screening mammogram for malignant neoplasm of breast: Secondary | ICD-10-CM | POA: Insufficient documentation

## 2015-05-28 ENCOUNTER — Encounter (HOSPITAL_COMMUNITY): Payer: Self-pay | Admitting: Emergency Medicine

## 2015-05-28 ENCOUNTER — Encounter: Payer: Self-pay | Admitting: Emergency Medicine

## 2015-05-28 ENCOUNTER — Ambulatory Visit (INDEPENDENT_AMBULATORY_CARE_PROVIDER_SITE_OTHER): Payer: BLUE CROSS/BLUE SHIELD | Admitting: Emergency Medicine

## 2015-05-28 ENCOUNTER — Emergency Department (HOSPITAL_COMMUNITY)
Admission: EM | Admit: 2015-05-28 | Discharge: 2015-05-28 | Discharge status: Against Medical Advice | Payer: BLUE CROSS/BLUE SHIELD | Attending: Emergency Medicine | Admitting: Emergency Medicine

## 2015-05-28 ENCOUNTER — Ambulatory Visit (INDEPENDENT_AMBULATORY_CARE_PROVIDER_SITE_OTHER): Payer: BLUE CROSS/BLUE SHIELD

## 2015-05-28 ENCOUNTER — Telehealth: Payer: Self-pay

## 2015-05-28 ENCOUNTER — Emergency Department (HOSPITAL_COMMUNITY)
Admission: EM | Admit: 2015-05-28 | Discharge: 2015-05-29 | Disposition: A | Discharge status: Home / Self Care | Payer: BLUE CROSS/BLUE SHIELD | Attending: Emergency Medicine | Admitting: Emergency Medicine

## 2015-05-28 VITALS — BP 138/92 | HR 98 | Temp 98.0°F | Resp 16

## 2015-05-28 DIAGNOSIS — R079 Chest pain, unspecified: Secondary | ICD-10-CM | POA: Diagnosis not present

## 2015-05-28 DIAGNOSIS — I10 Essential (primary) hypertension: Secondary | ICD-10-CM | POA: Diagnosis not present

## 2015-05-28 DIAGNOSIS — R1031 Right lower quadrant pain: Secondary | ICD-10-CM | POA: Insufficient documentation

## 2015-05-28 DIAGNOSIS — M1990 Unspecified osteoarthritis, unspecified site: Secondary | ICD-10-CM | POA: Diagnosis not present

## 2015-05-28 DIAGNOSIS — J45909 Unspecified asthma, uncomplicated: Secondary | ICD-10-CM | POA: Diagnosis not present

## 2015-05-28 DIAGNOSIS — M79662 Pain in left lower leg: Secondary | ICD-10-CM | POA: Insufficient documentation

## 2015-05-28 DIAGNOSIS — E039 Hypothyroidism, unspecified: Secondary | ICD-10-CM | POA: Diagnosis not present

## 2015-05-28 DIAGNOSIS — R109 Unspecified abdominal pain: Secondary | ICD-10-CM | POA: Diagnosis not present

## 2015-05-28 DIAGNOSIS — Z8719 Personal history of other diseases of the digestive system: Secondary | ICD-10-CM | POA: Diagnosis not present

## 2015-05-28 DIAGNOSIS — F329 Major depressive disorder, single episode, unspecified: Secondary | ICD-10-CM | POA: Insufficient documentation

## 2015-05-28 DIAGNOSIS — E780 Pure hypercholesterolemia: Secondary | ICD-10-CM | POA: Diagnosis not present

## 2015-05-28 DIAGNOSIS — Z85850 Personal history of malignant neoplasm of thyroid: Secondary | ICD-10-CM | POA: Diagnosis not present

## 2015-05-28 DIAGNOSIS — Z79899 Other long term (current) drug therapy: Secondary | ICD-10-CM | POA: Insufficient documentation

## 2015-05-28 LAB — BASIC METABOLIC PANEL
ANION GAP: 9 (ref 5–15)
BUN: 11 mg/dL (ref 6–20)
CALCIUM: 9.7 mg/dL (ref 8.9–10.3)
CO2: 30 mmol/L (ref 22–32)
Chloride: 102 mmol/L (ref 101–111)
Creatinine, Ser: 0.69 mg/dL (ref 0.44–1.00)
GFR calc Af Amer: >60 mL/min (ref >60)
GFR calc non Af Amer: >60 mL/min (ref >60)
Glucose, Bld: 101 mg/dL — ABNORMAL HIGH (ref 65–99)
Potassium: 4.3 mmol/L (ref 3.5–5.1)
Sodium: 141 mmol/L (ref 135–145)

## 2015-05-28 LAB — POCT CBC
Granulocyte percent: 57.4 %G (ref 37–80)
HCT, POC: 45.2 % (ref 37.7–47.9)
Hemoglobin: 15.6 g/dL (ref 12.2–16.2)
Lymph, poc: 5 — AB (ref 0.6–3.4)
MCH, POC: 31.2 pg (ref 27–31.2)
MCHC: 34.5 g/dL (ref 31.8–35.4)
MCV: 90.4 fL (ref 80–97)
MID (cbc): 0.4 (ref 0–0.9)
MPV: 6.8 fL (ref 0–99.8)
PLATELET COUNT, POC: 350 10*3/uL (ref 142–424)
POC Granulocyte: 7.2 — AB (ref 2–6.9)
POC LYMPH %: 39.4 % (ref 10–50)
POC MID %: 3.2 % (ref 0–12)
RBC: 5 M/uL (ref 4.04–5.48)
RDW, POC: 13.1 %
WBC: 12.6 10*3/uL — AB (ref 4.6–10.2)

## 2015-05-28 LAB — POCT URINALYSIS DIPSTICK
BILIRUBIN UA: NEGATIVE
Glucose, UA: NEGATIVE
KETONES UA: NEGATIVE
Leukocytes, UA: NEGATIVE
Nitrite, UA: NEGATIVE
Protein, UA: NEGATIVE
SPEC GRAV UA: 1.01
UROBILINOGEN UA: 0.2
pH, UA: 5.5

## 2015-05-28 LAB — POCT UA - MICROSCOPIC ONLY
CASTS, UR, LPF, POC: NEGATIVE
Crystals, Ur, HPF, POC: NEGATIVE
Mucus, UA: NEGATIVE
Yeast, UA: NEGATIVE

## 2015-05-28 LAB — CBC
HCT: 44.8 % (ref 36.0–46.0)
HEMOGLOBIN: 15.5 g/dL — AB (ref 12.0–15.0)
MCH: 31.3 pg (ref 26.0–34.0)
MCHC: 34.6 g/dL (ref 30.0–36.0)
MCV: 90.5 fL (ref 78.0–100.0)
Platelets: 278 10*3/uL (ref 150–400)
RBC: 4.95 MIL/uL (ref 3.87–5.11)
RDW: 12.4 % (ref 11.5–15.5)
WBC: 12.3 10*3/uL — ABNORMAL HIGH (ref 4.0–10.5)

## 2015-05-28 LAB — PROTIME-INR
INR: 0.86 (ref 0.00–1.49)
Prothrombin Time: 11.9 seconds (ref 11.6–15.2)

## 2015-05-28 NOTE — Progress Notes (Addendum)
Subjective:  This chart was scribed for Nena Jordan, MD by [facility], medical scribe at [facility].The patient was seen in exam room 22 and the patient's care was started at 3:59 PM.   Patient ID: Michelle [name], female    DOB: 1961-06-10, 54 y.o.   MRN: [id number] Chief Complaint  Patient presents with  . right side pain under rib and back    for 1 week   HPI HPI Comments: Michelle [name] is a 54 y.o. female who presents to [facility] complaining of right sided rib pain that radiates towards her back, onset one week ago. She has pain with deep breathing. Pain while standing up. Laying in bed the past four days. Heating pad, ice does not provide relief. Flexeril, and Advil do not help. No trauma or injury, no heavy lifting recently.She is a very active individual. One episode of vomiting, not sure why though. No abdominal pain or distention. No shortness of breath or rash. She does not have a history of a blood clots.  Review of Systems  Respiratory: Negative for shortness of breath.   Cardiovascular: Positive for chest pain.  Gastrointestinal: Positive for vomiting. Negative for abdominal pain and abdominal distention.  Musculoskeletal: Positive for back pain and arthralgias.  Skin: Negative for rash.       Objective:  BP 138/92 mmHg  Pulse 98  Temp(Src) 98 F (36.7 C) (Oral)  Resp 16  Ht   Wt   LMP 05/23/2006 Physical Exam  Vitals reviewed. CONSTITUTIONAL: Well developed/well nourished HEAD: Normocephalic/atraumatic EYES: EOMI/PERRL ENMT: Mucous membranes moist NECK: supple no meningeal signs SPINE/BACK:entire spine nontender there's tenderness along the right rib margin but no masses are felt there is no swelling in that area CV: S1/S2 noted, no murmurs/rubs/gallops noted LUNGS: decreased breath sounds on the right. ABDOMEN: soft, nontender, no rebound or guarding, bowel sounds noted throughout abdomen GU:no cva  tenderness NEURO: Pt is awake/alert/appropriate, moves all extremitiesx4.  No facial droop.   EXTREMITIES: pulses normal/equal, full ROM SKIN: warm, color normal PSYCH: no abnormalities of mood noted, alert and oriented to situation. [facility] reading (PRIMARY) by  DrDaub there is no acute disease.  Results for orders placed or performed in visit on 05/28/15  POCT CBC  Result Value Ref Range   WBC 12.6 (A) 4.6 - 10.2 K/uL   Lymph, poc 5.0 (A) 0.6 - 3.4   POC LYMPH PERCENT 39.4 10 - 50 %L   MID (cbc) 0.4 0 - 0.9   POC MID % 3.2 0 - 12 %M   POC Granulocyte 7.2 (A) 2 - 6.9   Granulocyte percent 57.4 37 - 80 %G   RBC 5.00 4.04 - 5.48 M/uL   Hemoglobin 15.6 12.2 - 16.2 g/dL   HCT, POC 45.2 37.7 - 47.9 %   MCV 90.4 80 - 97 fL   MCH, POC 31.2 27 - 31.2 pg   MCHC 34.5 31.8 - 35.4 g/dL   RDW, POC 13.1 %   Platelet Count, POC 350 142 - 424 K/uL   MPV 6.8 0 - 99.8 fL  POCT urinalysis dipstick  Result Value Ref Range   Color, UA Light Yellow    Clarity, UA Cloudy    Glucose, UA Negative    Bilirubin, UA Negative    Ketones, UA Negative    Spec Grav, UA 1.010    Blood, UA Trace-lysed    pH, UA 5.5    Protein, UA  Negative    Urobilinogen, UA 0.2    Nitrite, UA Negative    Leukocytes, UA Negative Negative  POCT UA - Microscopic Only  Result Value Ref Range   WBC, Ur, HPF, POC 0-2    RBC, urine, microscopic 0-2    Bacteria, U Microscopic 1+    Mucus, UA Negative    Epithelial cells, urine per micros 2-6    Crystals, Ur, HPF, POC Negative    Casts, Ur, LPF, POC Negative    Yeast, UA Negative        Assessment & Plan:   Patient presents with right flank and right lower chest pain. She has an elevated white count. Chest x-ray shows poor inspiration but otherwise return remarkable. She did have mild tenderness in the left calf. I was concerned about a possible clot. She will be sent to [facility] for their evaluation.I personally performed the services described in this  documentation, which was scribed in my presence. The recorded information has been reviewed and is accurate. I canceled the d-dimer I had originally ordered and felt this could be done in the emergency room. If her d-dimer is completely normal than hopefully she could avoid a CT angiogram.. Hopefully she can have a troponin also run in the emergency room. She has multiple drug allergies.  Nena Jordan, MD

## 2015-05-28 NOTE — Telephone Encounter (Signed)
Patient of Dr. Everlene Farrier. States she went to [facility] but left because she couldn't sit there and wait. She is in worse pain. Cb# [phone].

## 2015-05-28 NOTE — ED Notes (Signed)
Bed: WA25 Expected date:  Expected time:  Means of arrival:  Comments: Hold for [place]

## 2015-05-28 NOTE — ED Notes (Signed)
Pt, being sent by Daub MD, c/o R flank pain, L calf tenderness, and R chest pain x 1 week.  Pt reported that she cannot sit up straight.  MD reports that Pt had blood work and chest xray completed.

## 2015-05-28 NOTE — ED Notes (Signed)
Pt here earlier for same and left before being seen. C/o RUQ abdominal pain x2 days, radiating to mid back 6/10, described as sharp in nature. Denies urinary symptoms. Last BM today, normal for pt.

## 2015-05-29 ENCOUNTER — Ambulatory Visit (INDEPENDENT_AMBULATORY_CARE_PROVIDER_SITE_OTHER): Payer: BLUE CROSS/BLUE SHIELD

## 2015-05-29 ENCOUNTER — Emergency Department (HOSPITAL_COMMUNITY): Payer: BLUE CROSS/BLUE SHIELD

## 2015-05-29 ENCOUNTER — Encounter (HOSPITAL_COMMUNITY): Payer: Self-pay

## 2015-05-29 LAB — COMPREHENSIVE METABOLIC PANEL
ALT: 53 U/L (ref 14–54)
AST: 52 U/L — ABNORMAL HIGH (ref 15–41)
Albumin: 4.4 g/dL (ref 3.5–5.0)
Alkaline Phosphatase: 60 U/L (ref 38–126)
Anion gap: 5 (ref 5–15)
BUN: 13 mg/dL (ref 6–20)
CALCIUM: 9.3 mg/dL (ref 8.9–10.3)
CO2: 31 mmol/L (ref 22–32)
CREATININE: 0.67 mg/dL (ref 0.44–1.00)
Chloride: 104 mmol/L (ref 101–111)
GFR calc non Af Amer: >60 mL/min (ref >60)
Glucose, Bld: 100 mg/dL — ABNORMAL HIGH (ref 65–99)
Potassium: 3.6 mmol/L (ref 3.5–5.1)
SODIUM: 140 mmol/L (ref 135–145)
Total Bilirubin: 0.5 mg/dL (ref 0.3–1.2)
Total Protein: 7 g/dL (ref 6.5–8.1)

## 2015-05-29 LAB — CBC WITH DIFFERENTIAL/PLATELET
BASOS ABS: 0.1 10*3/uL (ref 0.0–0.1)
BASOS PCT: 1 % (ref 0–1)
EOS ABS: 0.4 10*3/uL (ref 0.0–0.7)
Eosinophils Relative: 3 % (ref 0–5)
HCT: 41.1 % (ref 36.0–46.0)
Hemoglobin: 13.9 g/dL (ref 12.0–15.0)
Lymphocytes Relative: 39 % (ref 12–46)
Lymphs Abs: 5.2 10*3/uL — ABNORMAL HIGH (ref 0.7–4.0)
MCH: 30.6 pg (ref 26.0–34.0)
MCHC: 33.8 g/dL (ref 30.0–36.0)
MCV: 90.5 fL (ref 78.0–100.0)
Monocytes Absolute: 1 10*3/uL (ref 0.1–1.0)
Monocytes Relative: 7 % (ref 3–12)
NEUTROS ABS: 6.6 10*3/uL (ref 1.7–7.7)
NEUTROS PCT: 50 % (ref 43–77)
PLATELETS: 261 10*3/uL (ref 150–400)
RBC: 4.54 MIL/uL (ref 3.87–5.11)
RDW: 12.5 % (ref 11.5–15.5)
WBC: 13.3 10*3/uL — AB (ref 4.0–10.5)

## 2015-05-29 LAB — URINALYSIS, ROUTINE W REFLEX MICROSCOPIC
BILIRUBIN URINE: NEGATIVE
Glucose, UA: NEGATIVE mg/dL
Hgb urine dipstick: NEGATIVE
Ketones, ur: NEGATIVE mg/dL
LEUKOCYTES UA: NEGATIVE
Nitrite: NEGATIVE
PH: 7 (ref 5.0–8.0)
Protein, ur: NEGATIVE mg/dL
SPECIFIC GRAVITY, URINE: 1.01 (ref 1.005–1.030)
Urobilinogen, UA: 0.2 mg/dL (ref 0.0–1.0)

## 2015-05-29 LAB — SEDIMENTATION RATE: Sed Rate: 1 mm/hr (ref 0–30)

## 2015-05-29 LAB — WET PREP, GENITAL
Clue Cells Wet Prep HPF POC: NONE SEEN
Trich, Wet Prep: NONE SEEN
Yeast Wet Prep HPF POC: NONE SEEN

## 2015-05-29 MED ORDER — OXYCODONE-ACETAMINOPHEN 5-325 MG PO TABS: 1.0000 | ORAL_TABLET | Freq: Four times a day (QID) | ORAL | Status: DC | PRN

## 2015-05-29 MED ORDER — HYDROMORPHONE HCL 2 MG/ML IJ SOLN
1.5000 mg | Freq: Once | INTRAMUSCULAR | Status: AC
  Administered 2015-05-29: 1.5 mg via INTRAVENOUS
  Filled 2015-05-29: qty 1

## 2015-05-29 MED ORDER — MORPHINE SULFATE 4 MG/ML IJ SOLN
4.0000 mg | Freq: Once | INTRAMUSCULAR | Status: AC
  Administered 2015-05-29: 4 mg via INTRAVENOUS
  Filled 2015-05-29: qty 1

## 2015-05-29 MED ORDER — SODIUM CHLORIDE 0.9 % IV BOLUS (SEPSIS)
1000.0000 mL | Freq: Once | INTRAVENOUS | Status: AC
  Administered 2015-05-29: 1000 mL via INTRAVENOUS

## 2015-05-29 MED ORDER — HYDROMORPHONE HCL 1 MG/ML IJ SOLN
1.0000 mg | Freq: Once | INTRAMUSCULAR | Status: AC
  Administered 2015-05-29: 1 mg via INTRAVENOUS
  Filled 2015-05-29: qty 1

## 2015-05-29 MED ORDER — IOHEXOL 300 MG/ML  SOLN
100.0000 mL | Freq: Once | INTRAMUSCULAR | Status: AC | PRN
  Administered 2015-05-29: 100 mL via INTRAVENOUS

## 2015-05-29 MED ORDER — ONDANSETRON HCL 4 MG/2ML IJ SOLN
4.0000 mg | Freq: Once | INTRAMUSCULAR | Status: AC
  Administered 2015-05-29: 4 mg via INTRAVENOUS
  Filled 2015-05-29: qty 2

## 2015-05-29 MED ORDER — IOHEXOL 300 MG/ML  SOLN
50.0000 mL | Freq: Once | INTRAMUSCULAR | Status: AC | PRN
  Administered 2015-05-29: 50 mL via ORAL

## 2015-05-29 MED ORDER — ONDANSETRON 4 MG PO TBDP: 4.0000 mg | ORAL_TABLET | Freq: Three times a day (TID) | ORAL | Status: DC | PRN

## 2015-05-29 NOTE — Discharge Instructions (Signed)
Abdominal Pain, Women °Abdominal (stomach, pelvic, or belly) pain can be caused by many things. It is important to tell your doctor: °· The location of the pain. °· Does it come and go or is it present all the time? °· Are there things that start the pain (eating certain foods, exercise)? °· Are there other symptoms associated with the pain (fever, nausea, vomiting, diarrhea)? °All of this is helpful to know when trying to find the cause of the pain. °CAUSES  °· Stomach: virus or bacteria infection, or ulcer. °· Intestine: appendicitis (inflamed appendix), regional ileitis (Crohn's disease), ulcerative colitis (inflamed colon), irritable bowel syndrome, diverticulitis (inflamed diverticulum of the colon), or cancer of the stomach or intestine. °· Gallbladder disease or stones in the gallbladder. °· Kidney disease, kidney stones, or infection. °· Pancreas infection or cancer. °· Fibromyalgia (pain disorder). °· Diseases of the female organs: °¨ Uterus: fibroid (non-cancerous) tumors or infection. °¨ Fallopian tubes: infection or tubal pregnancy. °¨ Ovary: cysts or tumors. °¨ Pelvic adhesions (scar tissue). °¨ Endometriosis (uterus lining tissue growing in the pelvis and on the pelvic organs). °¨ Pelvic congestion syndrome (female organs filling up with blood just before the menstrual period). °¨ Pain with the menstrual period. °¨ Pain with ovulation (producing an egg). °¨ Pain with an IUD (intrauterine device, birth control) in the uterus. °¨ Cancer of the female organs. °· Functional pain (pain not caused by a disease, may improve without treatment). °· Psychological pain. °· Depression. °DIAGNOSIS  °Your doctor will decide the seriousness of your pain by doing an examination. °· Blood tests. °· X-rays. °· Ultrasound. °· CT scan (computed tomography, special type of X-ray). °· MRI (magnetic resonance imaging). °· Cultures, for infection. °· Barium enema (dye inserted in the large intestine, to better view it with  X-rays). °· Colonoscopy (looking in intestine with a lighted tube). °· Laparoscopy (minor surgery, looking in abdomen with a lighted tube). °· Major abdominal exploratory surgery (looking in abdomen with a large incision). °TREATMENT  °The treatment will depend on the cause of the pain.  °· Many cases can be observed and treated at home. °· Over-the-counter medicines recommended by your caregiver. °· Prescription medicine. °· Antibiotics, for infection. °· Birth control pills, for painful periods or for ovulation pain. °· Hormone treatment, for endometriosis. °· Nerve blocking injections. °· Physical therapy. °· Antidepressants. °· Counseling with a psychologist or psychiatrist. °· Minor or major surgery. °HOME CARE INSTRUCTIONS  °· Do not take laxatives, unless directed by your caregiver. °· Take over-the-counter pain medicine only if ordered by your caregiver. Do not take aspirin because it can cause an upset stomach or bleeding. °· Try a clear liquid diet (broth or water) as ordered by your caregiver. Slowly move to a bland diet, as tolerated, if the pain is related to the stomach or intestine. °· Have a thermometer and take your temperature several times a day, and record it. °· Bed rest and sleep, if it helps the pain. °· Avoid sexual intercourse, if it causes pain. °· Avoid stressful situations. °· Keep your follow-up appointments and tests, as your caregiver orders. °· If the pain does not go away with medicine or surgery, you may try: °¨ Acupuncture. °¨ Relaxation exercises (yoga, meditation). °¨ Group therapy. °¨ Counseling. °SEEK MEDICAL CARE IF:  °· You notice certain foods cause stomach pain. °· Your home care treatment is not helping your pain. °· You need stronger pain medicine. °· You want your IUD removed. °· You feel faint or   lightheaded. °· You develop nausea and vomiting. °· You develop a rash. °· You are having side effects or an allergy to your medicine. °SEEK IMMEDIATE MEDICAL CARE IF:  °· Your  pain does not go away or gets worse. °· You have a fever. °· Your pain is felt only in portions of the abdomen. The right side could possibly be appendicitis. The left lower portion of the abdomen could be colitis or diverticulitis. °· You are passing blood in your stools (bright red or black tarry stools, with or without vomiting). °· You have blood in your urine. °· You develop chills, with or without a fever. °· You pass out. °MAKE SURE YOU:  °· Understand these instructions. °· Will watch your condition. °· Will get help right away if you are not doing well or get worse. °Document Released: 09/06/2007 Document Revised: 03/26/2014 Document Reviewed: 09/26/2009 °ExitCare® Patient Information ©2015 [employer]. This information is not intended to replace advice given to you by your health care provider. Make sure you discuss any questions you have with your health care provider. ° °

## 2015-05-29 NOTE — Telephone Encounter (Signed)
Patient called last night and was advised to return to [facility] for evaluation

## 2015-05-29 NOTE — ED Notes (Signed)
Radiology called. Pt tender and unable to sit still for [place], would like something for pain. Dr. Leonides Schanz made aware of same. Verbal orders for 1mg  Dilaudid IV.

## 2015-05-29 NOTE — ED Provider Notes (Addendum)
TIME SEEN: 12:40 AM  CHIEF COMPLAINT: Abdominal pain  HPI: Pt is a 54 y.o. female with history of hypertension, hyperlipidemia who presents to the emergency department with complaints of right middle and lower abdominal pain for the past week it is worse with movement and palpation, one episode of vomiting, slightly loose stools, chills without fever. Is status post cholecystectomy, Nissen 2, hysterectomy. Denies dysuria, hematuria. No vaginal bleeding or discharge. No sick contacts or recent travel. States she has been renovating a house with her family but denies any increased physical exertion, lifting anything has been heavy.  ROS: See HPI Constitutional: no fever  Eyes: no drainage  ENT: no runny nose   Cardiovascular:  no chest pain  Resp: no SOB  GI:  vomiting GU: no dysuria Integumentary: no rash  Allergy: no hives  Musculoskeletal: no leg swelling  Neurological: no slurred speech ROS otherwise negative  PAST MEDICAL HISTORY/PAST SURGICAL HISTORY:  Past Medical History  Diagnosis Date  . Hypercholesteremia   . Reflux   . Gastroparesis   . VAIN I (vaginal intraepithelial neoplasia grade I) 09/2013, 11/2014     with positive high-risk HPV screen   . GERD (gastroesophageal reflux disease)   . Hypertension   . Osteopenia 06/2013    T score -1.2 FRAX 8.6%/0.6%  . Hypothyroidism   . Thyroid cancer     papillary  . PONV (postoperative nausea and vomiting)   . Osteoarthritis   . Asthma   . Gastroparesis     history of  . Headache(784.0)     migraines  . Seizures     1 seizure secondary to wellbutrin-none since  . Allergy   . Anaphylaxis   . Depression   . Overdose, drug 10/30/2011    MEDICATIONS:  Prior to Admission medications   Medication Sig Start Date End Date Taking? Authorizing Provider  diclofenac sodium (VOLTAREN) 1 % GEL Apply 2 g topically 4 (four) times daily as needed (pain).    Yes Historical Provider, MD  estradiol (VIVELLE-DOT) 0.075 MG/24HR Place 1  patch onto the skin 2 (two) times a week. 12/04/14  Yes Anastasio Auerbach, MD  lansoprazole (PREVACID SOLUTAB) 30 MG disintegrating tablet Take 30 mg by mouth daily as needed (for stomach problems). Take 30- 60 min before your first and last meals of the day 03/30/13  Yes Tanda Rockers, MD  metoprolol succinate (TOPROL-XL) 25 MG 24 hr tablet Take 1 tablet by mouth every morning. If blood pressure still elevated take 1 tab in afternoon 11/13/14  Yes Darlyne Russian, MD  mometasone (NASONEX) 50 MCG/ACT nasal spray 2 sprays into each nares once a day 12/24/14  Yes Darlyne Russian, MD  ondansetron (ZOFRAN-ODT) 8 MG disintegrating tablet DISSOLVE ONE TABLET IN MOUTH EVERY SIX TO EIGHT HOURS AS NEEDED  03/12/14  Yes Darlyne Russian, MD  PRESCRIPTION MEDICATION Place 1 drop into both eyes every morning. Eye drop for glaucoma   Yes Historical Provider, MD  RESTASIS 0.05 % ophthalmic emulsion Place 1 drop into both eyes 2 (two) times daily.  11/08/14  Yes Historical Provider, MD  rosuvastatin (CRESTOR) 20 MG tablet Take 1 tablet (20 mg total) by mouth daily. 02/07/15  Yes Darlyne Russian, MD  SYNTHROID 75 MCG tablet TAKE ONE TABLET BY MOUTH ONE TIME DAILY BEFORE BREAKFAST 03/08/15  Yes Darlyne Russian, MD  valACYclovir (VALTREX) 500 MG tablet Take 500 mg by mouth 2 (two) times daily as needed (fever blisters).   Yes Historical  Provider, MD  VENTOLIN HFA 108 (90 BASE) MCG/ACT inhaler INHALE TWO PUFFS BY MOUTH EVERY FOUR HOURS AS NEEDED FOR WHEEZING, SHORTNESS OF BREATH OR COUGH 04/25/15  Yes Darlyne Russian, MD  vitamin B-12 (CYANOCOBALAMIN) 1000 MCG tablet Take 1,000 mcg by mouth daily.     Yes Historical Provider, MD  ZETIA 10 MG tablet TAKE ONE TABLET BY MOUTH ONE TIME DAILY  11/14/14  Yes Darlyne Russian, MD  acyclovir ointment (ZOVIRAX) 5 % Apply 1 application topically every 3 (three) hours. 01/04/15   Darlyne Russian, MD  EPINEPHrine 0.3 mg/0.3 mL IJ SOAJ injection Inject 0.3 mLs (0.3 mg total) into the muscle once. 10/24/14    Darlyne Russian, MD  valACYclovir (VALTREX) 500 MG tablet TAKE ONE TABLET BY MOUTH TWICE DAILY Patient not taking: Reported on 05/28/2015 05/01/15   Anastasio Auerbach, MD    ALLERGIES:  Allergies  Allergen Reactions  . Lunesta [Eszopiclone] Other (See Comments)    Causes sleep walking events  . Nutritional Supplements Anaphylaxis  . Pneumococcal Vaccines Anaphylaxis  . Tetanus Toxoids Swelling  . Wellbutrin [Bupropion Hcl] Other (See Comments)    seizure  . Zolpidem     Causes sleep walking events  . Bactrim Hives and Other (See Comments)    Severe headache  . Clarithromycin Itching and Other (See Comments)    Severe headache  . Doxycycline Other (See Comments)    Severe GI upset  . Hydone [Chlorthalidone] Other (See Comments)    Vasculitis   . Ranitidine Other (See Comments)    Acts like a diuretic   . Reglan [Metoclopramide] Other (See Comments)    Tremors and ticks, possible permanence of effects  . Sulfa Antibiotics Hives and Other (See Comments)    Severe headache  . Tetracyclines & Related Other (See Comments)    Severe GI upset    SOCIAL HISTORY:  History  Substance Use Topics  . Smoking status: Never Smoker   . Smokeless tobacco: Never Used  . Alcohol Use: No    FAMILY HISTORY: Family History  Problem Relation Age of Onset  . Heart disease Mother   . Hypertension Mother   . Heart disease Father   . Hypertension Father   . Emphysema Father 35    was a smoker  . Heart disease Sister   . Hypertension Sister   . Diabetes Paternal Grandmother   . Cancer Other   . Cancer Maternal Aunt     leukemia  . Cancer Maternal Grandmother   . Heart disease Brother   . Leukemia Maternal Grandfather     EXAM: BP 132/80 mmHg  Pulse 82  Temp(Src) 98.3 F (36.8 C) (Oral)  Resp 18  SpO2 97%  LMP 05/23/2006 CONSTITUTIONAL: Alert and oriented and responds appropriately to questions. Well-appearing; well-nourished HEAD: Normocephalic EYES: Conjunctivae clear,  PERRL ENT: normal nose; no rhinorrhea; moist mucous membranes; pharynx without lesions noted NECK: Supple, no meningismus, no LAD  CARD: RRR; S1 and S2 appreciated; no murmurs, no clicks, no rubs, no gallops RESP: Normal chest excursion without splinting or tachypnea; breath sounds clear and equal bilaterally; no wheezes, no rhonchi, no rales, no hypoxia or respiratory distress, speaking full sentences ABD/GI: Normal bowel sounds; non-distended; soft, tender to palpation in the right lower quadrant at McBurney's point with rebound and minimal voluntary guarding, no peritoneal signs GU:  Normal external genitalia, patient has right adnexal tenderness without fullness, no left adnexal tenderness or fullness, no cervix present, pt has vaginal cuff,  minimal amount of thin white vaginal discharge without odor, no vaginal bleeding BACK:  The back appears normal and is non-tender to palpation, there is no CVA tenderness EXT: Normal ROM in all joints; non-tender to palpation; no edema; normal capillary refill; no cyanosis, no calf tenderness or swelling    SKIN: Normal color for age and race; warm NEURO: Moves all extremities equally, sensation to light touch intact diffusely, cranial nerves II through XII intact PSYCH: The patient's mood and manner are appropriate. Grooming and personal hygiene are appropriate.  MEDICAL DECISION MAKING: Patient here with right lower quadrant abdominal pain at McBurney's point with guarding and rebound. Nonsurgical abdomen. We'll obtain labs, urine, CT of her abdomen and pelvis. We'll give pain and nausea medication.  ED PROGRESS: Patient's labs show leukocytosis without left shift that appears chronic for patient. She has predominant lymphocytes. CT scan shows no acute abnormality. Appendix appears normal. Adnexa appear normal. Pelvic exam performed the patient does have significant right adnexal tenderness without fullness and no cervical motion tenderness. We'll perform  transvaginal ultrasound with Doppler.   5:40 AM  Pt's transvaginal ultrasound is unremarkable. Right ovary appears normal with good Doppler flow. Left ovary was not visualized but she is not having any pain in this area. She is status post hysterectomy. Her wet prep shows moderate  WBCs but no other sign of infection. Gonorrhea and Chlamydia test pending but have low suspicion that this positive.  Unclear etiology for patient's right lower abdominal pain. She does state that she has been renovating a house recently and this may be musculoskeletal in nature. She has been able to tolerate fluids without difficulty. No vomiting or diarrhea since arriving in the emergency department. She is afebrile.  She does not have a surgical abdomen. I feel this time she is safe to be discharged home. Discussed with her return precautions and will discharge her with pain medication. She verbalized understanding and is comfortable with this plan.  [place], DO 05/29/15 Hagan Kashina Mecum, DO 05/29/15 [place], DO 05/29/15 [id number]

## 2015-05-29 NOTE — ED Notes (Signed)
Pt cannot use restroom at this time, aware urine sample is needed

## 2015-05-30 LAB — GC/CHLAMYDIA PROBE AMP (~~LOC~~) NOT AT ARMC
CHLAMYDIA, DNA PROBE: NEGATIVE
NEISSERIA GONORRHEA: NEGATIVE

## 2015-06-01 ENCOUNTER — Other Ambulatory Visit: Payer: Self-pay | Admitting: Emergency Medicine

## 2015-06-07 ENCOUNTER — Other Ambulatory Visit: Payer: Self-pay

## 2015-06-07 NOTE — Telephone Encounter (Signed)
Dr Everlene Farrier, pt's ins mandates 90 day supplies, but pt hasn't had a BP check up recently. Do you want to OK 90 day supply?

## 2015-06-08 ENCOUNTER — Telehealth: Payer: Self-pay

## 2015-06-08 MED ORDER — METOPROLOL SUCCINATE ER 25 MG PO TB24: ORAL_TABLET | ORAL | Status: DC

## 2015-06-08 NOTE — Telephone Encounter (Signed)
Called patient to inform her that I had faxed over her RX for her metoprolol to [facility] [employer] on [address]

## 2015-06-13 ENCOUNTER — Other Ambulatory Visit: Payer: Self-pay | Admitting: Emergency Medicine

## 2015-07-29 ENCOUNTER — Other Ambulatory Visit: Payer: Self-pay | Admitting: *Deleted

## 2015-07-29 ENCOUNTER — Telehealth: Payer: Self-pay | Admitting: *Deleted

## 2015-07-29 DIAGNOSIS — I10 Essential (primary) hypertension: Secondary | ICD-10-CM

## 2015-07-29 DIAGNOSIS — E785 Hyperlipidemia, unspecified: Secondary | ICD-10-CM

## 2015-07-29 DIAGNOSIS — C73 Malignant neoplasm of thyroid gland: Secondary | ICD-10-CM

## 2015-07-29 DIAGNOSIS — E039 Hypothyroidism, unspecified: Secondary | ICD-10-CM

## 2015-07-29 MED ORDER — SYNTHROID 75 MCG PO TABS: ORAL_TABLET | ORAL | Status: DC

## 2015-07-29 NOTE — Telephone Encounter (Signed)
Dr. Everlene Farrier, Patient states she just had a TSH done, I don't see anything after 12/15, can she just come in and have labs done or would you like to see her.

## 2015-07-29 NOTE — Telephone Encounter (Signed)
Please put in a future order for patient to have a lab only encounter. She needs a CBC, comprehensive metabolic panel, lipid panel, and TSH level. Also add a thyroglobulin level. I would like her to go ahead and have her labs done and then make an appointment for sometime in the fall for a checkup at [facility]

## 2015-07-29 NOTE — Telephone Encounter (Signed)
What diagnosis did you want me to use?

## 2015-07-30 NOTE — Telephone Encounter (Signed)
Spoke with pt, advised her orders in.

## 2015-07-30 NOTE — Telephone Encounter (Signed)
She has hypertension, hyperlipidemia, and  thyroid cancer, . This should be enough to cover her labs.

## 2015-07-31 ENCOUNTER — Other Ambulatory Visit: Payer: Self-pay | Admitting: Physician Assistant

## 2015-07-31 ENCOUNTER — Other Ambulatory Visit (INDEPENDENT_AMBULATORY_CARE_PROVIDER_SITE_OTHER): Payer: BLUE CROSS/BLUE SHIELD

## 2015-07-31 DIAGNOSIS — E785 Hyperlipidemia, unspecified: Secondary | ICD-10-CM

## 2015-07-31 DIAGNOSIS — I10 Essential (primary) hypertension: Secondary | ICD-10-CM

## 2015-07-31 DIAGNOSIS — C73 Malignant neoplasm of thyroid gland: Secondary | ICD-10-CM

## 2015-07-31 LAB — COMPREHENSIVE METABOLIC PANEL
ALT: 27 U/L (ref 6–29)
AST: 28 U/L (ref 10–35)
Albumin: 4.4 g/dL (ref 3.6–5.1)
Alkaline Phosphatase: 48 U/L (ref 33–130)
BUN: 11 mg/dL (ref 7–25)
CALCIUM: 9.3 mg/dL (ref 8.6–10.4)
CO2: 27 mmol/L (ref 20–31)
Chloride: 104 mmol/L (ref 98–110)
Creat: 0.69 mg/dL (ref 0.50–1.05)
GLUCOSE: 92 mg/dL (ref 65–99)
POTASSIUM: 4.1 mmol/L (ref 3.5–5.3)
Sodium: 141 mmol/L (ref 135–146)
Total Bilirubin: 0.6 mg/dL (ref 0.2–1.2)
Total Protein: 6.7 g/dL (ref 6.1–8.1)

## 2015-07-31 LAB — CBC
HEMATOCRIT: 42.8 % (ref 36.0–46.0)
HEMOGLOBIN: 14.5 g/dL (ref 12.0–15.0)
MCH: 31.3 pg (ref 26.0–34.0)
MCHC: 33.9 g/dL (ref 30.0–36.0)
MCV: 92.2 fL (ref 78.0–100.0)
MPV: 9.6 fL (ref 8.6–12.4)
Platelets: 268 10*3/uL (ref 150–400)
RBC: 4.64 MIL/uL (ref 3.87–5.11)
RDW: 13.1 % (ref 11.5–15.5)
WBC: 9.5 10*3/uL (ref 4.0–10.5)

## 2015-07-31 LAB — LIPID PANEL
CHOL/HDL RATIO: 3.4 ratio (ref <=5.0)
CHOLESTEROL: 170 mg/dL (ref 125–200)
HDL: 50 mg/dL (ref >=46)
LDL CALC: 83 mg/dL (ref <130)
Triglycerides: 185 mg/dL — ABNORMAL HIGH (ref <150)
VLDL: 37 mg/dL — AB (ref <30)

## 2015-07-31 LAB — TSH: TSH: 1.498 u[IU]/mL (ref 0.350–4.500)

## 2015-08-01 ENCOUNTER — Ambulatory Visit (INDEPENDENT_AMBULATORY_CARE_PROVIDER_SITE_OTHER): Payer: BLUE CROSS/BLUE SHIELD | Admitting: Emergency Medicine

## 2015-08-01 ENCOUNTER — Encounter: Payer: Self-pay | Admitting: Emergency Medicine

## 2015-08-01 VITALS — BP 139/92 | HR 85 | Temp 98.4°F | Resp 16

## 2015-08-01 DIAGNOSIS — R110 Nausea: Secondary | ICD-10-CM

## 2015-08-01 LAB — THYROGLOBULIN ANTIBODY PANEL
THYROGLOBULIN: 4.5 ng/mL (ref 2.8–40.9)
Thyroperoxidase Ab SerPl-aCnc: <1 IU/mL (ref <9)

## 2015-08-01 MED ORDER — ONDANSETRON 4 MG PO TBDP
4.0000 mg | ORAL_TABLET | Freq: Three times a day (TID) | ORAL | Status: DC | PRN
Start: 2015-08-01 — End: 2016-05-14

## 2015-08-01 MED ORDER — LANSOPRAZOLE 30 MG PO TBDP: 30.0000 mg | ORAL_TABLET | Freq: Two times a day (BID) | ORAL | Status: DC

## 2015-08-01 MED ORDER — ROSUVASTATIN CALCIUM 20 MG PO TABS: 20.0000 mg | ORAL_TABLET | Freq: Every day | ORAL | Status: DC

## 2015-08-01 MED ORDER — METOPROLOL SUCCINATE ER 25 MG PO TB24: ORAL_TABLET | ORAL | Status: DC

## 2015-08-01 MED ORDER — SUCRALFATE 1 GM/10ML PO SUSP: ORAL | Status: DC

## 2015-08-01 MED ORDER — EZETIMIBE 10 MG PO TABS: 10.0000 mg | ORAL_TABLET | Freq: Every day | ORAL | Status: DC

## 2015-08-01 MED ORDER — SYNTHROID 75 MCG PO TABS: ORAL_TABLET | ORAL | Status: DC

## 2015-08-01 NOTE — Telephone Encounter (Signed)
Meds refilled today.

## 2015-08-01 NOTE — Progress Notes (Signed)
Patient ID: Michelle [name], female   DOB: 01-27-1961, 54 y.o.   MRN: [id number]     This chart was scribed for Arlyss Queen, MD by Zola Button, Medical Scribe. This patient was seen in room 21 and the patient's care was started at 11:54 AM.   Chief Complaint:  Chief Complaint  Patient presents with  . Medication Refill    synthroid prescription    HPI: Michelle [name] is a 54 y.o. female with a history of thyroid cancer, GERD and gastroparesis who reports to [facility] today for a follow-up with medication refill. She has still been dealing with GI issues due to GERD. Patient has tried probiotics, but they make her nauseous. She does not take Xanax anymore. She has had 2 Nissen fundoplication surgeries.  Patient reports having a purple-colored area beneath the skin behind her right ear.  Things are going well with her and her family. Her mother started a new medication for A Fib. Rodman Key has started back at work.  Past Medical History  Diagnosis Date  . Hypercholesteremia   . Reflux   . Gastroparesis   . VAIN I (vaginal intraepithelial neoplasia grade I) 09/2013, 11/2014     with positive high-risk HPV screen   . GERD (gastroesophageal reflux disease)   . Hypertension   . Osteopenia 06/2013    T score -1.2 FRAX 8.6%/0.6%  . Hypothyroidism   . Thyroid cancer     papillary  . PONV (postoperative nausea and vomiting)   . Osteoarthritis   . Asthma   . Gastroparesis     history of  . Headache(784.0)     migraines  . Seizures     1 seizure secondary to wellbutrin-none since  . Allergy   . Anaphylaxis   . Depression   . Overdose, drug 10/30/2011   Past Surgical History  Procedure Laterality Date  . Cosmetic surgery      buttock liposuction  . Thyroid surgery      for thyroid cancer  . Nissen fundoplication  1884, 1660    REFLUX  . Hernia repair  2004, 2012  . Breast surgery      reduc tion mammoplasty  . Cholecystectomy    . Upper gastrointestinal endoscopy  08/26/2004  .  Varicose vein surgery  03/20/2003    right leg  . Colposcopy  02/2012    VAIN I  . Vaginal hysterectomy  2007    irregular bleeding   Social History   Social History  . Marital Status: Married    Spouse Name: [name]  . Number of Children: 1  . Years of Education: College   Occupational History  . homemaker    Social History Main Topics  . Smoking status: Never Smoker   . Smokeless tobacco: Never Used  . Alcohol Use: No  . Drug Use: No  . Sexual Activity:    Partners: Male    Birth Control/ Protection: Surgical   Other Topics Concern  . None   Social History Narrative   Exercise: Yes.   Lives with her husband. Marital discord.   Receiving therapy with Vivia Budge.   Son lives locally.   Family History  Problem Relation Age of Onset  . Heart disease Mother   . Hypertension Mother   . Heart disease Father   . Hypertension Father   . Emphysema Father 57    was a smoker  . Heart disease Sister   . Hypertension Sister   . Diabetes Paternal Grandmother   .  Cancer Other   . Cancer Maternal Aunt     leukemia  . Cancer Maternal Grandmother   . Heart disease Brother   . Leukemia Maternal Grandfather    Allergies  Allergen Reactions  . Lunesta [Eszopiclone] Other (See Comments)    Causes sleep walking events  . Nutritional Supplements Anaphylaxis  . Pneumococcal Vaccines Anaphylaxis  . Tetanus Toxoids Swelling  . Wellbutrin [Bupropion Hcl] Other (See Comments)    seizure  . Zolpidem     Causes sleep walking events  . Bactrim Hives and Other (See Comments)    Severe headache  . Clarithromycin Itching and Other (See Comments)    Severe headache  . Doxycycline Other (See Comments)    Severe GI upset  . Hydone [Chlorthalidone] Other (See Comments)    Vasculitis   . Ranitidine Other (See Comments)    Acts like a diuretic   . Reglan [Metoclopramide] Other (See Comments)    Tremors and ticks, possible permanence of effects  . Sulfa Antibiotics Hives and  Other (See Comments)    Severe headache  . Tetracyclines & Related Other (See Comments)    Severe GI upset   Prior to Admission medications   Medication Sig Start Date End Date Taking? Authorizing Provider  acyclovir ointment (ZOVIRAX) 5 % Apply 1 application topically every 3 (three) hours. 01/04/15  Yes Darlyne Russian, MD  diclofenac sodium (VOLTAREN) 1 % GEL Apply 2 g topically 4 (four) times daily as needed (pain).    Yes Historical Provider, MD  EPINEPHrine 0.3 mg/0.3 mL IJ SOAJ injection Inject 0.3 mLs (0.3 mg total) into the muscle once. 10/24/14  Yes Darlyne Russian, MD  estradiol (VIVELLE-DOT) 0.075 MG/24HR Place 1 patch onto the skin 2 (two) times a week. 12/04/14  Yes Anastasio Auerbach, MD  lansoprazole (PREVACID SOLUTAB) 30 MG disintegrating tablet Take 30 mg by mouth daily as needed (for stomach problems). Take 30- 60 min before your first and last meals of the day 03/30/13  Yes Tanda Rockers, MD  metoprolol succinate (TOPROL-XL) 25 MG 24 hr tablet TAKE ONE TAB BY MOUTH EVERY MORN, IF BLOOD PRESSURE STILL ELEVATED TAKE 1 TAB IN AFTERNOON. PATIENT NEEDS OFFICE VISIT FOR ADDITIONAL REFILLS 06/08/15  Yes Darlyne Russian, MD  mometasone (NASONEX) 50 MCG/ACT nasal spray 2 sprays into each nares once a day 12/24/14  Yes Darlyne Russian, MD  ondansetron (ZOFRAN ODT) 4 MG disintegrating tablet Take 1 tablet (4 mg total) by mouth every 8 (eight) hours as needed for nausea or vomiting. 05/29/15  Yes Kristen N Ward, DO  PRESCRIPTION MEDICATION Place 1 drop into both eyes every morning. Eye drop for glaucoma   Yes Historical Provider, MD  rosuvastatin (CRESTOR) 20 MG tablet TAKE ONE TABLET BY MOUTH ONE TIME DAILY 06/14/15  Yes Darlyne Russian, MD  SYNTHROID 75 MCG tablet TAKE ONE TABLET BY MOUTH ONE TIME DAILY BEFORE BREAKFAST.  "OV NEEDED" 07/29/15  Yes Chelle Jeffery, PA-C  valACYclovir (VALTREX) 500 MG tablet TAKE ONE TABLET BY MOUTH TWICE DAILY 05/01/15  Yes Anastasio Auerbach, MD  valACYclovir (VALTREX) 500 MG  tablet Take 500 mg by mouth 2 (two) times daily as needed (fever blisters).   Yes Historical Provider, MD  VENTOLIN HFA 108 (90 BASE) MCG/ACT inhaler INHALE TWO PUFFS BY MOUTH EVERY FOUR HOURS AS NEEDED FOR WHEEZING, SHORTNESS OF BREATH OR COUGH 04/25/15  Yes Darlyne Russian, MD  vitamin B-12 (CYANOCOBALAMIN) 1000 MCG tablet Take 1,000 mcg by mouth  daily.     Yes Historical Provider, MD  ZETIA 10 MG tablet TAKE ONE TABLET BY MOUTH ONE TIME DAILY  11/14/14  Yes Darlyne Russian, MD  oxyCODONE-acetaminophen (PERCOCET/ROXICET) 5-325 MG per tablet Take 1-2 tablets by mouth every 6 (six) hours as needed. Patient not taking: Reported on 08/01/2015 05/29/15   Delice Bison Ward, DO     ROS: The patient denies fevers, chills, night sweats, unintentional weight loss, chest pain, palpitations, wheezing, dyspnea on exertion, abdominal pain, dysuria, hematuria, melena, numbness, weakness, or tingling.   All other systems have been reviewed and were otherwise negative with the exception of those mentioned in the HPI and as above.    PHYSICAL EXAM: Filed Vitals:   08/01/15 1121  BP: 139/92  Pulse: 85  Temp: 98.4 F (36.9 C)  Resp: 16   There is no weight on file to calculate BMI.   General: Alert, no acute distress HEENT:  Normocephalic, atraumatic, oropharynx patent. Eye: [facility] M[facility] Cardiovascular:  Regular rate and rhythm, no rubs murmurs or gallops.  No Carotid bruits, radial pulse intact. No pedal edema.  Respiratory: Clear to auscultation bilaterally.  No wheezes, rales, or rhonchi.  No cyanosis, no use of accessory musculature Abdominal: No organomegaly, abdomen is soft and non-tender, positive bowel sounds.  No masses. Musculoskeletal: Gait intact. No edema, tenderness Skin: Discoloration behind the right ear beneath the skin which is not palpable and has a bluish hue. There is no induration in this area nor skin changes. Neurologic: Facial musculature symmetric. Psychiatric: Patient acts  appropriately throughout our interaction. Lymphatic: No cervical or submandibular lymphadenopathy BREAST there is a large bruise medial right breast.   LABS:    EKG/XRAY:   Primary read interpreted by Dr. Everlene Farrier at [facility].   ASSESSMENT/PLAN: Patient having more reflux symptoms. She also has delayed gastric emptying. I told her she could take a maximum of 2 Prevacid a day. I also a for a prescription for Carafate so she can have 2 teaspoons twice a day. Her other medications were refilled blood work looks good except for a low HDL with high triglycerides. Otherwise labs were excellent. I'm going to add a vitamin D B12 magnesium level to bloodwork she has at the lab.    Gross sideeffects, risk and benefits, and alternatives of medications d/w patient. Patient is aware that all medications have potential sideeffects and we are unable to predict every sideeffect or drug-drug interaction that may occur.  Arlyss Queen MD 08/01/2015 12:14 PM

## 2015-08-01 NOTE — Telephone Encounter (Signed)
Dr Everlene Farrier, I denied RFs of Synthroid and metoprolol today, because pt has appt w/you today. Please discuss and give RFs on these meds at appt.

## 2015-08-27 ENCOUNTER — Encounter: Payer: Self-pay | Admitting: Emergency Medicine

## 2015-08-27 ENCOUNTER — Telehealth: Payer: Self-pay

## 2015-08-27 DIAGNOSIS — G43A0 Cyclical vomiting, not intractable: Secondary | ICD-10-CM

## 2015-08-27 NOTE — Telephone Encounter (Signed)
Pt is wanting a referral to dr Leta Baptist at [facility]

## 2015-08-27 NOTE — Telephone Encounter (Signed)
That referral would be fine I just need to know what issues she is having.

## 2015-08-27 NOTE — Telephone Encounter (Signed)
Pain in her calf, nausea, and abdominal pain/ She also has to get glasses every 6 months. They can never find anything on the scans she already had done. She would like to see what this doctor can do for her.

## 2015-08-28 ENCOUNTER — Other Ambulatory Visit: Payer: Self-pay | Admitting: Emergency Medicine

## 2015-08-28 DIAGNOSIS — R112 Nausea with vomiting, unspecified: Secondary | ICD-10-CM

## 2015-08-28 DIAGNOSIS — M79606 Pain in leg, unspecified: Secondary | ICD-10-CM

## 2015-08-28 NOTE — Telephone Encounter (Signed)
Spoke with Michelle [name], advised Dr. Everlene Farrier message. Michelle [name] agreed to do the CT scan. What diagnosis do you want me to attach to the order?

## 2015-08-28 NOTE — Telephone Encounter (Signed)
I would suggest we do a CT head no contrast first. I have also placed a referral to Dr. Jae Dire for evaluation. Call patient and see if she is agreeable to that and if she is going ahead and order the CT head no contrast make sure the scan is no contrast she is highly allergic to everything.

## 2015-08-28 NOTE — Telephone Encounter (Signed)
Associate it with recurrent episodes of vomiting.

## 2015-08-29 NOTE — Telephone Encounter (Signed)
Order placed

## 2015-09-05 ENCOUNTER — Telehealth: Payer: Self-pay

## 2015-09-05 NOTE — Telephone Encounter (Signed)
Call [facility] and tell her if Dr. Cornelious Bryant prefers an MRI that is certainly fine. She should not have the CT Please check and be sure she does not have any metal in her body that would prevent her from having an MRI

## 2015-09-05 NOTE — Telephone Encounter (Signed)
The patient called to discuss her referral and radiology orders.  She wanted to inform us that Dr Leta Baptist will want to order an MRI of the head, since that is what he always orders for his patients.  Dr Everlene Farrier ordered a CT scan of the head last week.  She doesn't want to schedule yet because of the potential OOP cost through her insurance for getting two scans.  If the MRI would suffice, she would prefer to only get that scan once it is ordered by Dr Leta Baptist.  If Dr Everlene Farrier still deems it necessary to get the CT scan, she is willing, but she doesn't want to get two scans if one scan will give results needed for treatment.  Please advise, thank you.  CB#: [phone] (home) or [phone] (cell)

## 2015-09-05 NOTE — Telephone Encounter (Signed)
See message, ok for me to advise patient ok to go for the MRI only not the CT ?

## 2015-09-10 NOTE — Telephone Encounter (Signed)
Callie did you call pt, you took the message but i am unsure.

## 2015-09-17 ENCOUNTER — Ambulatory Visit (INDEPENDENT_AMBULATORY_CARE_PROVIDER_SITE_OTHER): Payer: BLUE CROSS/BLUE SHIELD | Admitting: Diagnostic Neuroimaging

## 2015-09-17 ENCOUNTER — Encounter: Payer: Self-pay | Admitting: Diagnostic Neuroimaging

## 2015-09-17 VITALS — BP 110/78 | HR 80 | Ht 64.0 in | Wt 150.0 lb

## 2015-09-17 DIAGNOSIS — R42 Dizziness and giddiness: Secondary | ICD-10-CM | POA: Diagnosis not present

## 2015-09-17 DIAGNOSIS — H539 Unspecified visual disturbance: Secondary | ICD-10-CM | POA: Diagnosis not present

## 2015-09-17 DIAGNOSIS — R269 Unspecified abnormalities of gait and mobility: Secondary | ICD-10-CM | POA: Diagnosis not present

## 2015-09-17 DIAGNOSIS — R930 Abnormal findings on diagnostic imaging of skull and head, not elsewhere classified: Secondary | ICD-10-CM

## 2015-09-17 DIAGNOSIS — R9089 Other abnormal findings on diagnostic imaging of central nervous system: Secondary | ICD-10-CM

## 2015-09-17 NOTE — Patient Instructions (Addendum)

## 2015-09-17 NOTE — Progress Notes (Signed)
[facility]  PATIENT: Michelle [name] DOB: 1961-08-20  REFERRING CLINICIAN: Daub HISTORY FROM: patient  REASON FOR VISIT: new consult    HISTORICAL  CHIEF COMPLAINT:  Chief Complaint  Patient presents with  . Pain    rm 6, New patient    HISTORY OF PRESENT ILLNESS:   54 year old right-handed female here for evaluation of vertigo, vision changes, nausea, constipation, balance problems, foot pain, eye pain. Patient has history of asthma, papillary carcinoma of thyroid, hyper sensitivity to multiple medications, glaucoma, hypercholesteremia, depression.  Around age 29 years old patient had onset of numerous constellation of symptoms including GI problems such as nausea, constipation, acid reflux. She also had intermittent episodes of dizziness and vertigo. She would have intermittent episodes of vision changes, feeling of her visual field moving in front of her. Her balance was poor at times. Symptoms would occur for hours or days at a time. Sometimes she would have 3-6 months of significant symptoms followed by several months of remission of symptoms.  Around age 67 years old patient sought medical attention. She was ultimately referred to a neurologist who ordered a CT scan of the head which showed "shadows and plaques" and possibility of multiple sclerosis was raised. However patient was in the process of moving from [place] to [place] at that time and did not follow-up with a neurologist in [place]. This was around 1999.  Over the past 17 years patient had continued to have similar symptoms. She's been following up with primary care and GI specialist. She decided not to pursue neurologic follow-up. However within the last few months her sister's daughter (the patient's niece; who also happens to be a patient of mine) was diagnosed with multiple sclerosis by me. Therefore the patient's niece mentioned to the patient to come and get a neurologic evaluation  because they felt that their symptoms were quite similar.   REVIEW OF SYSTEMS: Full 14 system review of systems performed and notable only for memory loss headache difficulty swallowing passing out with bowel movement tremor insomnia restless legs depression not asleep decreased energy disinterest in activities racing thoughts joint pain cramps aching muscles feeling hot flushing easy bruising blurred vision eye pain shortness of breath constipation weight gain fatigue ringing in ears spinning sensation itching.   ALLERGIES: Allergies  Allergen Reactions  . Lunesta [Eszopiclone] Other (See Comments)    Causes sleep walking events  . Nutritional Supplements Anaphylaxis  . Pneumococcal Vaccines Anaphylaxis  . Tetanus Toxoids Swelling  . Wellbutrin [Bupropion Hcl] Other (See Comments)    seizure  . Zolpidem     Causes sleep walking events  . Bactrim Hives and Other (See Comments)    Severe headache  . Clarithromycin Itching and Other (See Comments)    Severe headache  . Doxycycline Other (See Comments)    Severe GI upset  . Hydone [Chlorthalidone] Other (See Comments)    Vasculitis   . Ranitidine Other (See Comments)    Acts like a diuretic   . Reglan [Metoclopramide] Other (See Comments)    Tremors and ticks, possible permanence of effects  . Sulfa Antibiotics Hives and Other (See Comments)    Severe headache  . Tetracyclines & Related Other (See Comments)    Severe GI upset    HOME MEDICATIONS: Outpatient Prescriptions Prior to Visit  Medication Sig Dispense Refill  . acyclovir ointment (ZOVIRAX) 5 % Apply 1 application topically every 3 (three) hours. 5 g 2  . diclofenac sodium (VOLTAREN) 1 %  GEL Apply 2 g topically 4 (four) times daily as needed (pain).     Marland Kitchen EPINEPHrine 0.3 mg/0.3 mL IJ SOAJ injection Inject 0.3 mLs (0.3 mg total) into the muscle once. 2 Device 2  . estradiol (VIVELLE-DOT) 0.075 MG/24HR Place 1 patch onto the skin 2 (two) times a week. 8 patch 12  .  ezetimibe (ZETIA) 10 MG tablet Take 1 tablet (10 mg total) by mouth daily. 90 tablet 2  . lansoprazole (PREVACID SOLUTAB) 30 MG disintegrating tablet Take 1 tablet (30 mg total) by mouth 2 (two) times daily. Take 30- 60 min before your first and last meals of the day 180 tablet 2  . metoprolol succinate (TOPROL-XL) 25 MG 24 hr tablet TAKE ONE TAB BY MOUTH EVERY MORN, IF BLOOD PRESSURE STILL ELEVATED TAKE 1 TAB IN AFTERNOON. 180 tablet 2  . mometasone (NASONEX) 50 MCG/ACT nasal spray 2 sprays into each nares once a day 17 g 11  . ondansetron (ZOFRAN ODT) 4 MG disintegrating tablet Take 1 tablet (4 mg total) by mouth every 8 (eight) hours as needed for nausea or vomiting. 20 tablet 2  . PRESCRIPTION MEDICATION Place 1 drop into both eyes every morning. Eye drop for glaucoma    . rosuvastatin (CRESTOR) 20 MG tablet Take 1 tablet (20 mg total) by mouth daily. 90 tablet 2  . sucralfate (CARAFATE) 1 GM/10ML suspension Take 2 teaspoonfuls twice a day on empty stomach to help with reflux 420 mL 2  . SYNTHROID 75 MCG tablet TAKE ONE TABLET BY MOUTH ONE TIME DAILY BEFORE BREAKFAST. 90 tablet 2  . valACYclovir (VALTREX) 500 MG tablet Take 500 mg by mouth 2 (two) times daily as needed (fever blisters).    . VENTOLIN HFA 108 (90 BASE) MCG/ACT inhaler INHALE TWO PUFFS BY MOUTH EVERY FOUR HOURS AS NEEDED FOR WHEEZING, SHORTNESS OF BREATH OR COUGH 18 Inhaler 1  . vitamin B-12 (CYANOCOBALAMIN) 1000 MCG tablet Take 1,000 mcg by mouth daily.      Marland Kitchen oxyCODONE-acetaminophen (PERCOCET/ROXICET) 5-325 MG per tablet Take 1-2 tablets by mouth every 6 (six) hours as needed. (Patient not taking: Reported on 08/01/2015) 20 tablet 0  . valACYclovir (VALTREX) 500 MG tablet TAKE ONE TABLET BY MOUTH TWICE DAILY 20 tablet 3   No facility-administered medications prior to visit.    PAST MEDICAL HISTORY: Past Medical History  Diagnosis Date  . Hypercholesteremia   . Reflux   . Gastroparesis   . VAIN I (vaginal intraepithelial  neoplasia grade I) 09/2013, 11/2014     with positive high-risk HPV screen   . GERD (gastroesophageal reflux disease)   . Hypertension   . Osteopenia 06/2013    T score -1.2 FRAX 8.6%/0.6%  . Hypothyroidism   . Thyroid cancer ([place])     papillary  . PONV (postoperative nausea and vomiting)   . Osteoarthritis   . Asthma   . Gastroparesis     history of  . Headache(784.0)     migraines  . Seizures ([place])     1 seizure secondary to wellbutrin-none since  . Allergy   . Anaphylaxis   . Depression   . Overdose, drug 10/30/2011    PAST SURGICAL HISTORY: Past Surgical History  Procedure Laterality Date  . Cosmetic surgery      buttock liposuction  . Thyroid surgery      for thyroid cancer  . Nissen fundoplication  1751, 0258    REFLUX  . Hernia repair  2004, 2012  . Breast surgery  reduc tion mammoplasty  . Cholecystectomy    . Upper gastrointestinal endoscopy  08/26/2004  . Varicose vein surgery  03/20/2003    right leg  . Colposcopy  02/2012    VAIN I  . Vaginal hysterectomy  2007    irregular bleeding    FAMILY HISTORY: Family History  Problem Relation Age of Onset  . Heart disease Mother   . Hypertension Mother   . Heart disease Father   . Hypertension Father   . Emphysema Father 38    was a smoker  . Heart disease Sister   . Hypertension Sister   . Diabetes Paternal Grandmother   . Cancer Other   . Cancer Maternal Aunt     leukemia  . Cancer Maternal Grandmother   . Heart disease Brother   . Leukemia Maternal Grandfather     SOCIAL HISTORY:  Social History   Social History  . Marital Status: Married    Spouse Name: [name]  . Number of Children: 1  . Years of Education: College   Occupational History  . homemaker    Social History Main Topics  . Smoking status: Never Smoker   . Smokeless tobacco: Never Used  . Alcohol Use: No  . Drug Use: No  . Sexual Activity:    Partners: Male    Birth Control/ Protection: Surgical   Other Topics  Concern  . Not on file   Social History Narrative   Exercise: Yes.   Lives with her husband. Marital discord.   Receiving therapy with Vivia Budge.   Son lives locally.     PHYSICAL EXAM  GENERAL EXAM/CONSTITUTIONAL: Vitals:  Filed Vitals:   09/17/15 0915  BP: 110/78  Pulse: 80  Height: 5\' 4"  (1.626 m)  Weight: 150 lb (68.04 kg)     Body mass index is 25.73 kg/(m^2).  Visual Acuity Screening   Right eye Left eye Both eyes  Without correction:     With correction: 20/40 20/30      Patient is in no distress; well developed, nourished and groomed; neck is supple  CARDIOVASCULAR:  Examination of carotid arteries is normal; no carotid bruits  Regular rate and rhythm, no murmurs  Examination of peripheral vascular system by observation and palpation is normal  EYES:  Ophthalmoscopic exam of optic discs and posterior segments is normal; no papilledema or hemorrhages  MUSCULOSKELETAL:  Gait, strength, tone, movements noted in Neurologic exam below  NEUROLOGIC: MENTAL STATUS:  No flowsheet data found.  awake, alert, oriented to person, place and time  recent and remote memory intact  normal attention and concentration  language fluent, comprehension intact, naming intact,   fund of knowledge appropriate  CRANIAL NERVE:   2nd - no papilledema on fundoscopic exam; SUBJECTIVE DECR LIGHT SENS IN LEFT EYE (75% OF RIGHT EYE)  2nd, 3rd, 4th, 6th - pupils equal and reactive to light, visual fields full to confrontation, extraocular muscles intact --> SUBJECTIVE DOUBLE VISION ON LEFT GAZE, no nystagmus  5th - facial sensation symmetric  7th - facial strength symmetric  8th - hearing intact  9th - palate elevates symmetrically, uvula midline  11th - shoulder shrug symmetric  12th - tongue protrusion midline  MOTOR:   normal bulk and tone, full strength in the BUE, BLE  SENSORY:   normal and symmetric to light touch, temperature,  vibration  COORDINATION:   finger-nose-finger, fine finger movements --> SLOW; MILD DYSMETRIA ON LEFT HAND  REFLEXES:   deep tendon reflexes present and  symmetric; DOWN GOING TOES  GAIT/STATION:   narrow based gait; UNSTEADY GAIT; able to walk on toes and heels; romberg is negative    DIAGNOSTIC DATA (LABS, IMAGING, TESTING) - I reviewed patient records, labs, notes, testing and imaging myself where available.  Lab Results  Component Value Date   WBC 9.5 07/31/2015   HGB 14.5 07/31/2015   HCT 42.8 07/31/2015   MCV 92.2 07/31/2015   PLT 268 07/31/2015      Component Value Date/Time   NA 141 07/31/2015 0816   NA 143 10/18/2012 1526   K 4.1 07/31/2015 0816   K 3.4* 10/18/2012 1526   CL 104 07/31/2015 0816   CL 106 10/18/2012 1526   CO2 27 07/31/2015 0816   CO2 28 10/18/2012 1526   GLUCOSE 92 07/31/2015 0816   GLUCOSE 109* 10/18/2012 1526   BUN 11 07/31/2015 0816   BUN 7.0 10/18/2012 1526   CREATININE 0.69 07/31/2015 0816   CREATININE 0.67 05/29/2015 0101   CREATININE 0.8 10/18/2012 1526   CALCIUM 9.3 07/31/2015 0816   CALCIUM 9.2 10/18/2012 1526   PROT 6.7 07/31/2015 0816   PROT 7.1 10/18/2012 1526   ALBUMIN 4.4 07/31/2015 0816   ALBUMIN 3.8 10/18/2012 1526   AST 28 07/31/2015 0816   AST 34 10/18/2012 1526   ALT 27 07/31/2015 0816   ALT 36 10/18/2012 1526   ALKPHOS 48 07/31/2015 0816   ALKPHOS 69 10/18/2012 1526   BILITOT 0.6 07/31/2015 0816   BILITOT 0.52 10/18/2012 1526   GFRNONAA >60 05/29/2015 0101   GFRNONAA >89 12/17/2014 1113   GFRAA >60 05/29/2015 0101   GFRAA >89 12/17/2014 1113   Lab Results  Component Value Date   CHOL 170 07/31/2015   HDL 50 07/31/2015   LDLCALC 83 07/31/2015   TRIG 185* 07/31/2015   CHOLHDL 3.4 07/31/2015   No results found for: HGBA1C No results found for: [id number] Lab Results  Component Value Date   TSH 1.498 07/31/2015      ASSESSMENT AND PLAN  54 y.o. year old female here with constellation of  neurologic and GI symptoms since age 37 years old, with relapsing and remitting pattern of symptoms. Apparently patient had abnormal CT scan in 1999 raising possibility of multiple sclerosis. Will pursue further workup with MRI of the brain and additional lab testing.   Dx:  Dizziness and giddiness - Plan: MR Brain W Wo Contrast, ANA w/Reflex if Positive, Pan-ANCA, Vit D  25 hydroxy (rtn osteoporosis monitoring), Angiotensin converting enzyme, HIV antibody (with reflex), Sjogren's syndrome antibods(ssa + ssb)  Vision changes - Plan: MR Brain W Wo Contrast, ANA w/Reflex if Positive, Pan-ANCA, Vit D  25 hydroxy (rtn osteoporosis monitoring), Angiotensin converting enzyme, HIV antibody (with reflex), Sjogren's syndrome antibods(ssa + ssb)  Abnormal CT of brain  Gait difficulty     PLAN:  Orders Placed This Encounter  Procedures  . MR Brain W Wo Contrast  . ANA w/Reflex if Positive  . Pan-ANCA  . Vit D  25 hydroxy (rtn osteoporosis monitoring)  . Angiotensin converting enzyme  . HIV antibody (with reflex)  . Sjogren's syndrome antibods(ssa + ssb)   Return in about 6 weeks (around 10/29/2015).    Penni Bombard, MD 99/35/7017, 79:39 AM Certified in Neurology, Neurophysiology and Neuroimaging  [facility] [address] [place], [place] [address] [phone]

## 2015-09-18 LAB — HIV ANTIBODY (ROUTINE TESTING W REFLEX): HIV SCREEN 4TH GENERATION: NONREACTIVE

## 2015-09-18 LAB — ANA W/REFLEX IF POSITIVE: Anti Nuclear Antibody(ANA): NEGATIVE

## 2015-09-18 LAB — SJOGREN'S SYNDROME ANTIBODS(SSA + SSB): ENA SSB (LA) Ab: <0.2 AI (ref 0.0–0.9)

## 2015-09-18 LAB — VITAMIN D 25 HYDROXY (VIT D DEFICIENCY, FRACTURES): VIT D 25 HYDROXY: 35.7 ng/mL (ref 30.0–100.0)

## 2015-09-18 LAB — ANGIOTENSIN CONVERTING ENZYME: Angio Convert Enzyme: 63 U/L (ref 14–82)

## 2015-09-19 ENCOUNTER — Telehealth: Payer: Self-pay | Admitting: *Deleted

## 2015-09-19 LAB — PAN-ANCA
C-ANCA: <1:20 {titer}
Myeloperoxidase Ab: <9 U/mL (ref 0.0–9.0)
P-ANCA: <1:20 {titer}

## 2015-09-19 NOTE — Telephone Encounter (Signed)
Left VM informing patient her lab results are normal. Left this caller's name, number for any questions.

## 2015-09-25 IMAGING — CT CT ABD-PELV W/ CM
2 of 5 series · 16 of 46 positions shown, 18 images · IV contrast (APPLIED)
Comparison: 10/12/2012

CLINICAL DATA: Epigastric and right lower quadrant pain. Vomiting
and diarrhea. History of partial hysterectomy, cholecystectomy, and
reflux.

EXAM:
CT ABDOMEN AND PELVIS WITH CONTRAST
TECHNIQUE: Multidetector CT imaging of the abdomen and pelvis was performed
using the standard protocol following bolus administration of
intravenous contrast.
CONTRAST:  25mL OMNIPAQUE IOHEXOL 300 MG/ML SOLN, 100mL OMNIPAQUE
IOHEXOL 300 MG/ML SOLN

[Series 2: abd/pelvis 5.0 b31f · axial · 0.72mm/px · z∈[+963,+1388]mm · 13 of 97 slices shown, 15 images]
[im 6/97  soft-tissue]
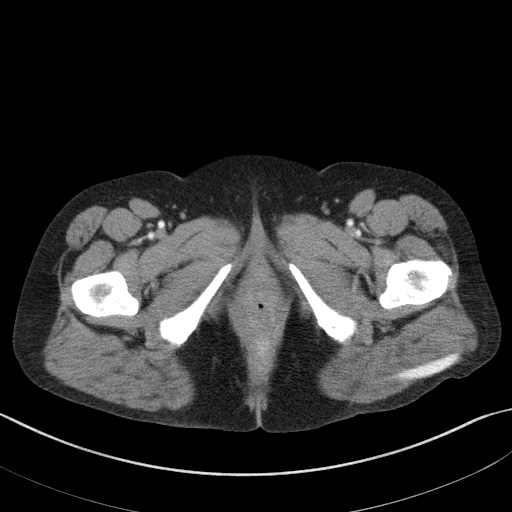
[im 6/97  bone]
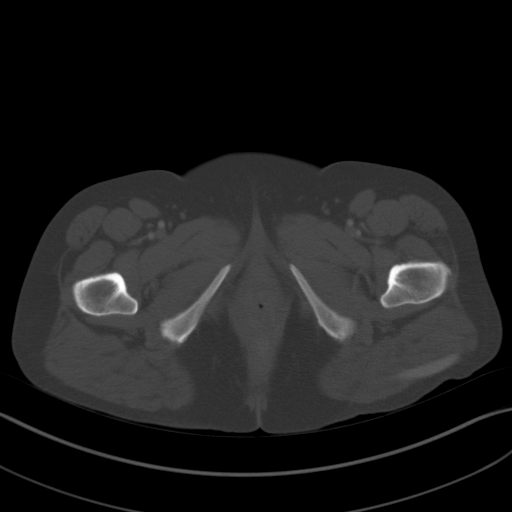
[im 16/97  soft-tissue]
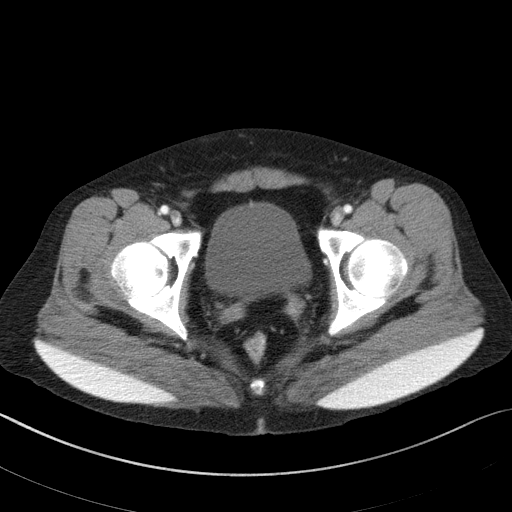
[im 21/97  soft-tissue]
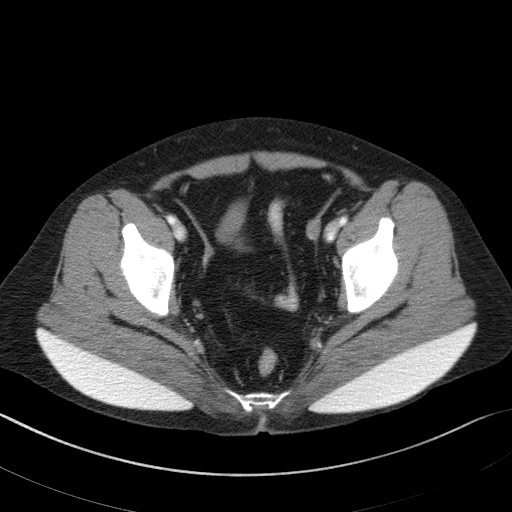
[im 26/97  soft-tissue]
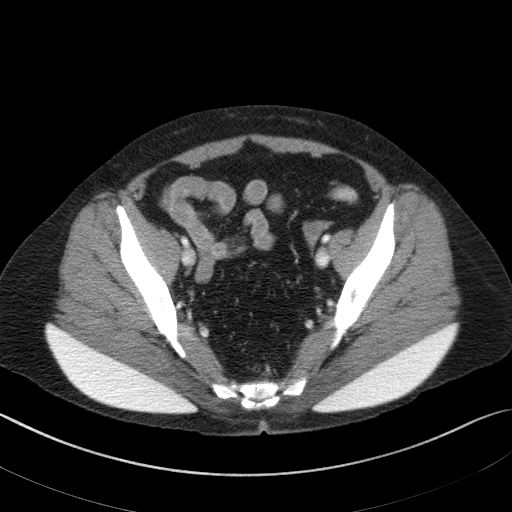
[im 36/97  soft-tissue]
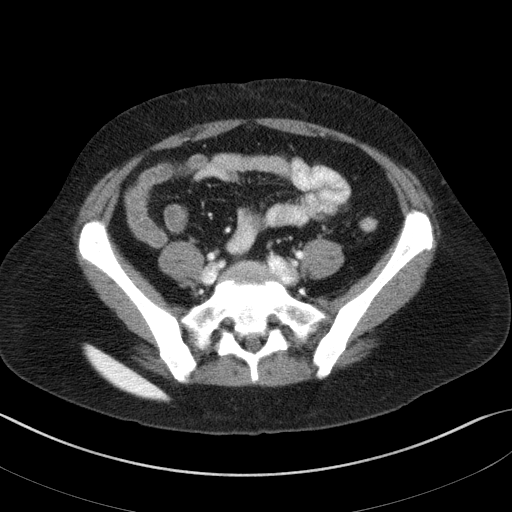
[im 41/97  soft-tissue]
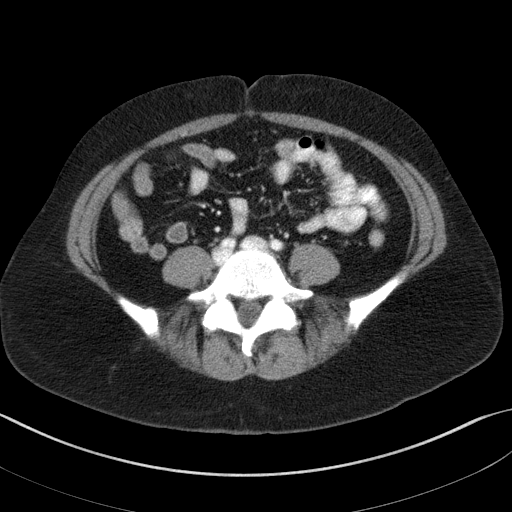
[im 51/97  soft-tissue]
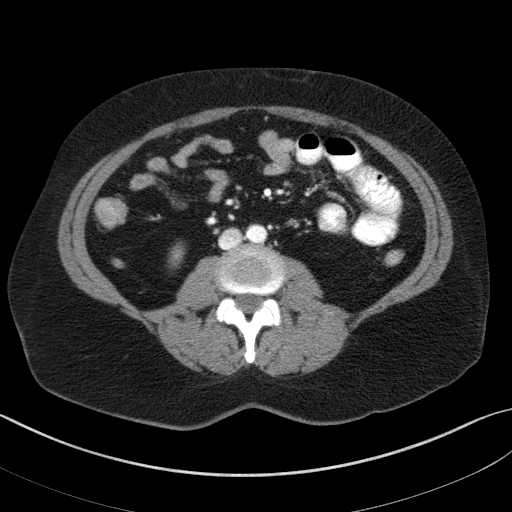
[im 56/97  soft-tissue]
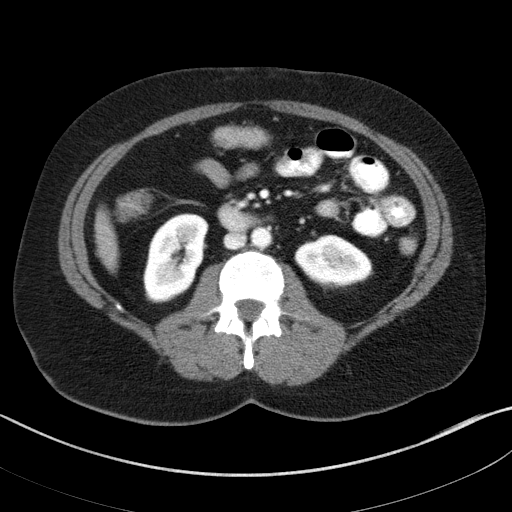
[im 61/97  soft-tissue]
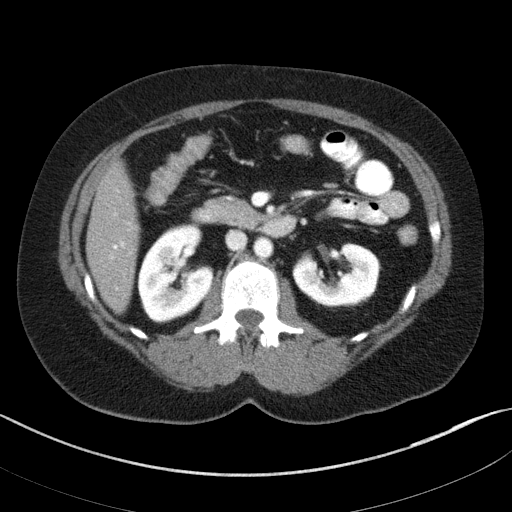
[im 61/97  bone]
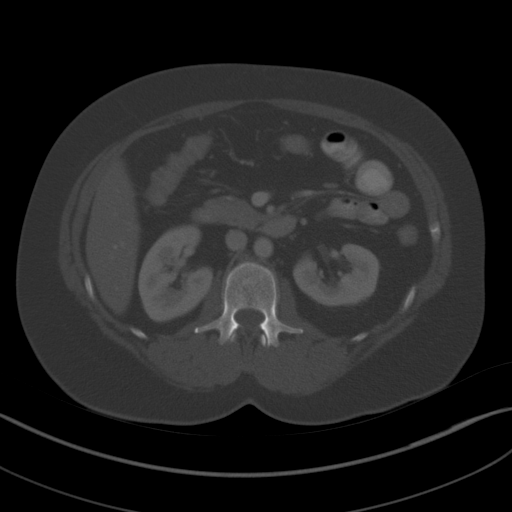
[im 71/97  soft-tissue]
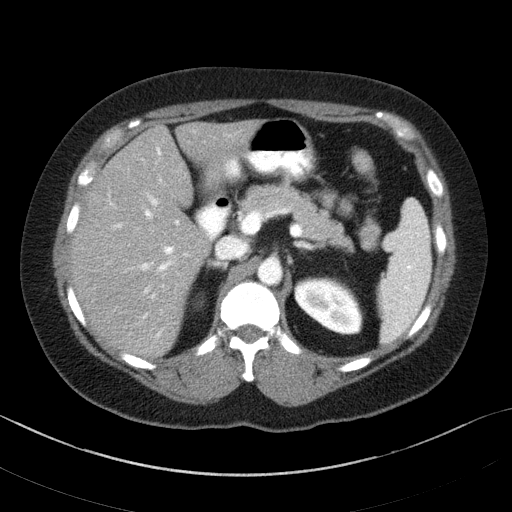
[im 76/97  soft-tissue]
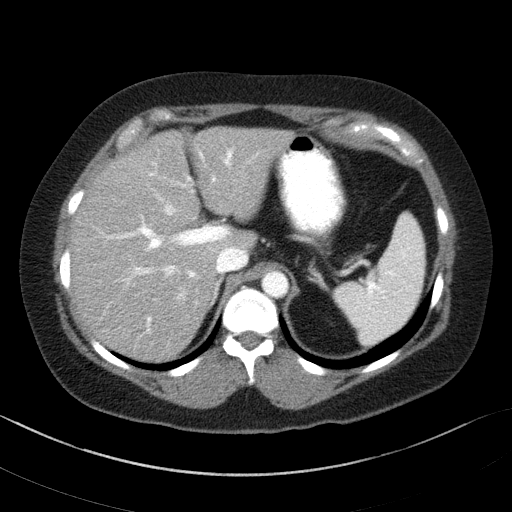
[im 81/97  soft-tissue]
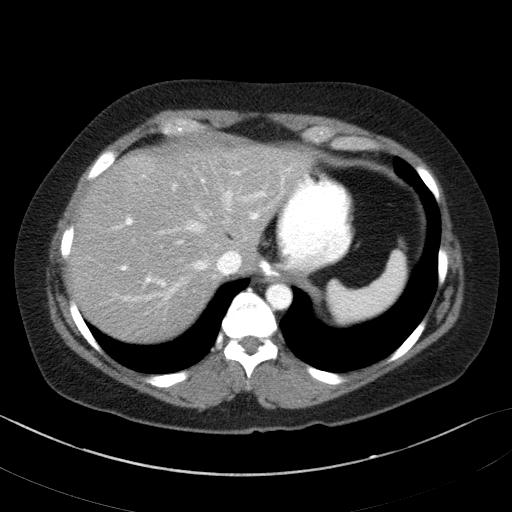
[im 91/97  soft-tissue]
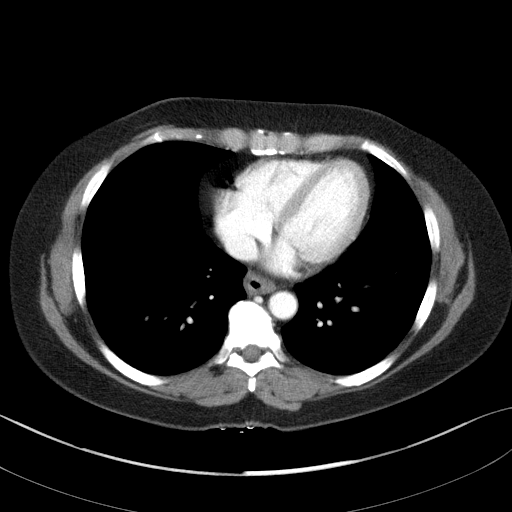

[Series 5: abd/pelvis 3.0 coronal · coronal · 0.65mm/px · 3 of 89 slices shown]
[im 30/89  soft-tissue]
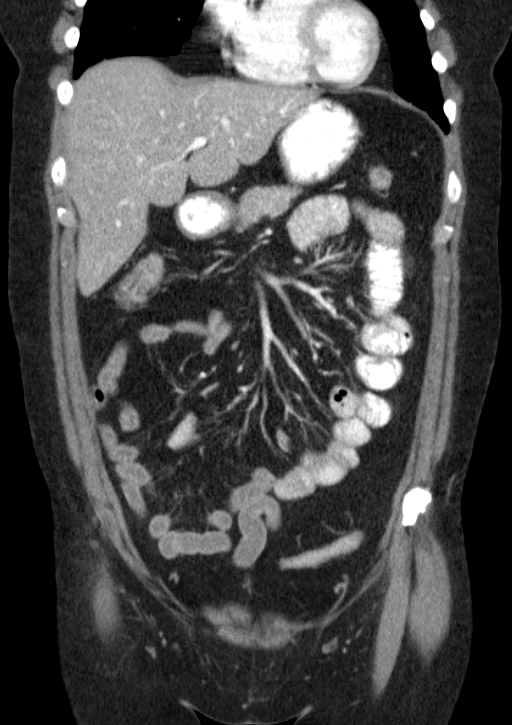
[im 40/89  soft-tissue]
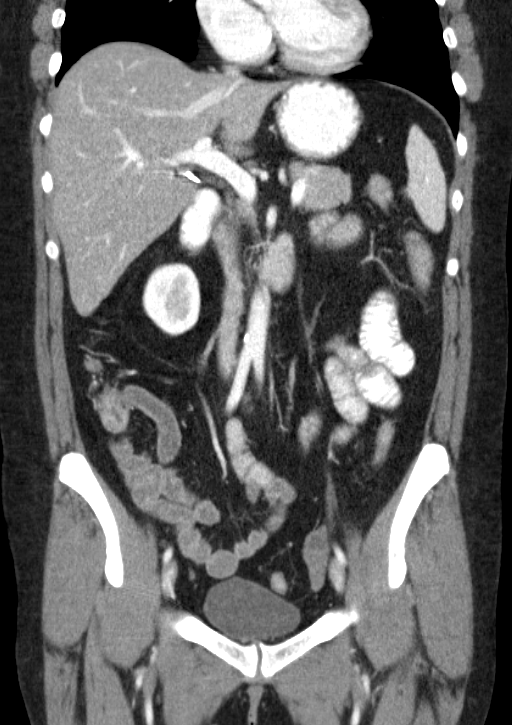
[im 49/89  soft-tissue]
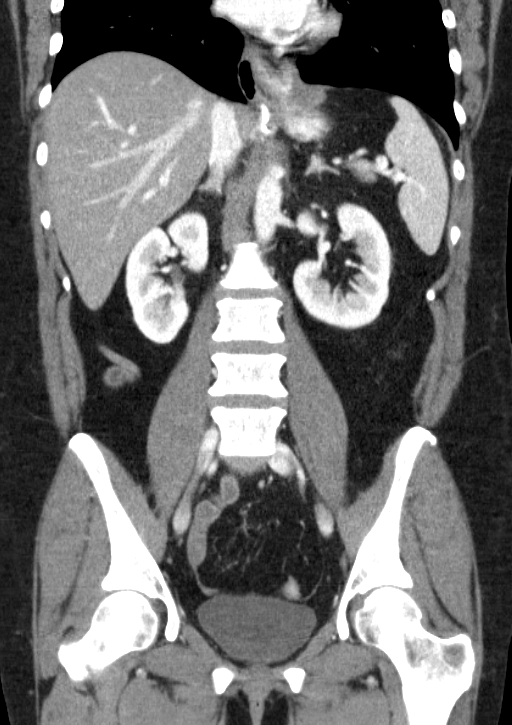

[16 of 46 positions shown; findings below may reference images not displayed]

FINDINGS: Lower chest: Lung bases are unremarkable in appearance.

Upper abdomen: No focal abnormality identified within the liver,
spleen, pancreas, adrenal glands the stomach or kidneys. The ureters
are normal in appearance. Gallbladder is surgically absent.

Gastrointestinal tract: There are surgical clips in the region of
the gastroesophageal junction, most likely indicating previous
Nissen fundoplication. Otherwise the stomach and small bowel loops
are normal in appearance. The appendix is well seen and has a normal
appearance. Colonic loops are normal in appearance.

Pelvis: The uterus is surgically absent. No adnexal mass or free
pelvic fluid.

Retroperitoneum: No retroperitoneal or mesenteric adenopathy. No
evidence for aortic aneurysm.

Abdominal wall: Unremarkable.

Osseous structures: No suspicious lytic or blastic lesions are
identified.
IMPRESSION: 1.  No evidence for acute  abnormality.
2. Status post cholecystectomy, hysterectomy.
3. Normal appendix.

## 2015-09-30 ENCOUNTER — Ambulatory Visit (INDEPENDENT_AMBULATORY_CARE_PROVIDER_SITE_OTHER): Payer: BLUE CROSS/BLUE SHIELD | Admitting: Family Medicine

## 2015-09-30 ENCOUNTER — Encounter: Payer: Self-pay | Admitting: Family Medicine

## 2015-09-30 VITALS — BP 127/87 | HR 78 | Temp 98.6°F | Resp 16 | Ht 64.0 in

## 2015-09-30 DIAGNOSIS — Z915 Personal history of self-harm: Secondary | ICD-10-CM

## 2015-09-30 DIAGNOSIS — Z9189 Other specified personal risk factors, not elsewhere classified: Secondary | ICD-10-CM

## 2015-09-30 DIAGNOSIS — E7800 Pure hypercholesterolemia, unspecified: Secondary | ICD-10-CM | POA: Diagnosis not present

## 2015-09-30 DIAGNOSIS — K219 Gastro-esophageal reflux disease without esophagitis: Secondary | ICD-10-CM

## 2015-09-30 DIAGNOSIS — Z9151 Personal history of suicidal behavior: Secondary | ICD-10-CM

## 2015-09-30 DIAGNOSIS — G4700 Insomnia, unspecified: Secondary | ICD-10-CM

## 2015-09-30 DIAGNOSIS — Z85850 Personal history of malignant neoplasm of thyroid: Secondary | ICD-10-CM

## 2015-09-30 DIAGNOSIS — I10 Essential (primary) hypertension: Secondary | ICD-10-CM | POA: Diagnosis not present

## 2015-09-30 DIAGNOSIS — K3184 Gastroparesis: Secondary | ICD-10-CM | POA: Diagnosis not present

## 2015-09-30 MED ORDER — LEVOTHYROXINE SODIUM 100 MCG PO TABS: 100.0000 ug | ORAL_TABLET | Freq: Every day | ORAL | Status: DC

## 2015-09-30 NOTE — Progress Notes (Signed)
Subjective:    Patient ID: [name], female    DOB: June 21, 1961, 54 y.o.   MRN: [id number]  09/30/2015  Establish Care and Depression   HPI This 54 y.o. female presents to establish care.    Last physical: Pap smear: 05-31-2013  [facility].  LGSIL with HPV+. Mammogram:  05-21-2015 Colonoscopy: 02-14-15 WNL; hung Bone density:  07-13-13 [facility] TDAP: Pneumovax:   Zostavax: Influenza: Eye exam: Dental exam:  Insomnia: Dr. Nelva Bush for MRI because; came from [place]; Concern regarding MS.  Crespin has recently been diagnosed; can be familial.  Has gastroparesis which can be an indicator.  With and without constrast MRI.  There were lesions 15 years ago.  May warrant MRI neck and spine.  Not sleeping.  Has gained 15 pounds in past year; has not been this heavy in past year. Cannot take sleep meds; can take Trazodone but does not take any; cannot take Ambien due to amnesia.  Xanax had been taking everything; no dreams.  Does not desire dreams.  SI attempt thus Dr. Everlene Farrier refuses to prescribe; no Xanax in two years.  Very difficult; took 7 weeks to wean.    Depression: cannot take antidepressants; has taken Wellbutrin but did not feel right.  Saw [facility]; thinks had seizure on Wellbutrin. Not sure what all else has been prescribed.  Prozac caused paranoia.  Very sensitive to medication.  Sees Dr. Nicole Kindred; no recent visit. 35 years.  Marital counseling.  Corky Sox is therapist.    GI issues; Most health issues are due to GI dysfunction.  Chronic constipation; Amitiza makes pt have explosive diarrhea.  Even with smallest dose.  Capsule also does the same thing.  Can take Miralax which is really rough. Will vomit with Miralax; GI system is really sensitive.   Prevacid 30mg  bid. Carafate PRN.   Zofran PRN which worsens constipation.  Hypercholesterol:  Crestor 20mg  and Zetia 10mg  to control levels.  Muscle aches.    Thyroid cancer: nine years ago; no endocrinology at this point; previous  [place]; does not like office; Synthroid 13mcg.  Dropped because of missing appointment.  Glaucoma: no glaucoma specialist.  Tachycardia: 125 heart rate off of Metoprolol.  Chronic issue since 40s.  Cannot take two Metroprolol 25mg  daily.  S/p flu vaccine this season in October 2016 at [facility].  Review of Systems  Constitutional: Negative for fever, chills, diaphoresis and fatigue.  Eyes: Negative for visual disturbance.  Respiratory: Negative for cough and shortness of breath.   Cardiovascular: Negative for chest pain, palpitations and leg swelling.  Gastrointestinal: Negative for nausea, vomiting, abdominal pain, diarrhea and constipation.  Endocrine: Negative for cold intolerance, heat intolerance, polydipsia, polyphagia and polyuria.  Neurological: Negative for dizziness, tremors, seizures, syncope, facial asymmetry, speech difficulty, weakness, light-headedness, numbness and headaches.    Past Medical History  Diagnosis Date  . Hypercholesteremia   . Reflux   . Gastroparesis   . VAIN I (vaginal intraepithelial neoplasia grade I) 09/2013, 11/2014     with positive high-risk HPV screen   . GERD (gastroesophageal reflux disease)   . Hypertension   . Osteopenia 06/2013    T score -1.2 FRAX 8.6%/0.6%  . Hypothyroidism   . Thyroid cancer ([place])     papillary  . PONV (postoperative nausea and vomiting)   . Osteoarthritis   . Asthma   . Gastroparesis     history of  . Headache(784.0)     migraines  . Seizures ([place])     1 seizure secondary  to wellbutrin-none since  . Allergy   . Anaphylaxis   . Depression   . Overdose, drug 10/30/2011   Past Surgical History  Procedure Laterality Date  . Cosmetic surgery      buttock liposuction  . Thyroid surgery      for thyroid cancer  . Nissen fundoplication  3557, 3220    REFLUX  . Hernia repair  2004, 2012  . Breast surgery      reduc tion mammoplasty  . Cholecystectomy    . Upper gastrointestinal endoscopy  08/26/2004  . Varicose  vein surgery  03/20/2003    right leg  . Colposcopy  02/2012    VAIN I  . Vaginal hysterectomy  2007    irregular bleeding   Allergies  Allergen Reactions  . Lunesta [Eszopiclone] Other (See Comments)    Causes sleep walking events  . Nutritional Supplements Anaphylaxis  . Pneumococcal Vaccines Anaphylaxis  . Tetanus Toxoids Swelling  . Wellbutrin [Bupropion Hcl] Other (See Comments)    seizure  . Zolpidem     Causes sleep walking events  . Bactrim Hives and Other (See Comments)    Severe headache  . Clarithromycin Itching and Other (See Comments)    Severe headache  . Doxycycline Other (See Comments)    Severe GI upset  . Hydone [Chlorthalidone] Other (See Comments)    Vasculitis   . Ranitidine Other (See Comments)    Acts like a diuretic   . Reglan [Metoclopramide] Other (See Comments)    Tremors and ticks, possible permanence of effects  . Sulfa Antibiotics Hives and Other (See Comments)    Severe headache  . Tetracyclines & Related Other (See Comments)    Severe GI upset    Social History   Social History  . Marital Status: Married    Spouse Name: [name]  . Number of Children: 1  . Years of Education: College   Occupational History  . homemaker    Social History Main Topics  . Smoking status: Never Smoker   . Smokeless tobacco: Never Used  . Alcohol Use: No  . Drug Use: No  . Sexual Activity:    Partners: Male    Birth Control/ Protection: Surgical   Other Topics Concern  . Not on file   Social History Narrative   Exercise: Yes.   Lives with her husband. Marital discord.   Receiving therapy with Vivia Budge.   Son lives locally.   Family History  Problem Relation Age of Onset  . Heart disease Mother   . Hypertension Mother   . Heart disease Father   . Hypertension Father   . Emphysema Father 51    was a smoker  . Heart disease Sister   . Hypertension Sister   . Diabetes Paternal Grandmother   . Multiple sclerosis Other   . Cancer  Maternal Aunt     leukemia  . Cancer Maternal Grandmother   . Heart disease Brother   . Leukemia Maternal Grandfather        Objective:    BP 127/87 mmHg  Pulse 78  Temp(Src) 98.6 F (37 C) (Oral)  Resp 16  Ht 5\' 4"  (1.626 m)  Wt   LMP 05/23/2006 Physical Exam  Constitutional: She is oriented to person, place, and time. She appears well-developed and well-nourished. No distress.  HENT:  Head: Normocephalic and atraumatic.  Right Ear: External ear normal.  Left Ear: External ear normal.  Nose: Nose normal.  Mouth/Throat: Oropharynx is clear and  moist.  Eyes: Conjunctivae and EOM are normal. Pupils are equal, round, and reactive to light.  Neck: Normal range of motion. Neck supple. Carotid bruit is not present. No thyromegaly present.  Cardiovascular: Normal rate, regular rhythm, normal heart sounds and intact distal pulses.  Exam reveals no gallop and no friction rub.   No murmur heard. Pulmonary/Chest: Effort normal and breath sounds normal. She has no wheezes. She has no rales.  Abdominal: Soft. Bowel sounds are normal. She exhibits no distension and no mass. There is no tenderness. There is no rebound and no guarding.  Lymphadenopathy:    She has no cervical adenopathy.  Neurological: She is alert and oriented to person, place, and time. No cranial nerve deficit.  Skin: Skin is warm and dry. No rash noted. She is not diaphoretic. No erythema. No pallor.  Psychiatric: She has a normal mood and affect. Her behavior is normal.   Results for orders placed or performed in visit on 09/17/15  ANA w/Reflex if Positive  Result Value Ref Range   Anit Nuclear Antibody(ANA) Negative Negative  Pan-ANCA  Result Value Ref Range   Myeloperoxidase Ab <9.0 0.0 - 9.0 U/mL   ANCA Proteinase 3 <3.5 0.0 - 3.5 U/mL   C-ANCA <1:20 Neg:<1:20 titer   P-ANCA <1:20 Neg:<1:20 titer   Atypical pANCA <1:20 Neg:<1:20 titer  Vit D  25 hydroxy (rtn osteoporosis monitoring)  Result Value Ref Range     Vit D, 25-Hydroxy 35.7 30.0 - 100.0 ng/mL  Angiotensin converting enzyme  Result Value Ref Range   Angio Convert Enzyme 63 14 - 82 U/L  HIV antibody (with reflex)  Result Value Ref Range   HIV Screen 4th Generation wRfx Non Reactive Non Reactive  Sjogren's syndrome antibods(ssa + ssb)  Result Value Ref Range   ENA SSA (RO) Ab <0.2 0.0 - 0.9 AI   ENA SSB (LA) Ab <0.2 0.0 - 0.9 AI       Assessment & Plan:   1. Hypercholesteremia   2. Gastroesophageal reflux disease without esophagitis   3. Gastroparesis   4. Hx of thyroid cancer   5. H/O suicide attempt   6. Essential hypertension   7. Insomnia     1. Hypercholesterolemia: stable; with Crestor and Zetia.  2.  GERD: stable with Prevacid; followed closely by GI. 3.  Gastroparesis: stable; followed closely by GI. 4.  History of thyroid cancer/hypothryoidism surgical: Agreeable to increasing Synthroid to 15mcg daily. 5.  History of suicide attempt via drug overdose: AVOID ALL BENZOS AND CONTROLLED SUBSTANCES.  6.  HTN: controlled; no changes to management. 7.  Insomnia: uncontrolled; intolerant to multiple medications and must avoid controlled substances. 8, Depression; uncontrolled; marital strain; continue with psychotherapy.  No serious SI at this time; contracted safety.    No orders of the defined types were placed in this encounter.   Meds ordered this encounter  Medications  . levothyroxine (SYNTHROID) 100 MCG tablet    Sig: Take 1 tablet (100 mcg total) by mouth daily before breakfast.    Dispense:  90 tablet    Refill:  1    Return in about 3 months (around 12/31/2015).    Autumnrose Yore Elayne Guerin, M.D. [facility] [address] [place], [place]  [address] [phone] phone [phone] fax

## 2015-10-02 ENCOUNTER — Other Ambulatory Visit: Payer: BLUE CROSS/BLUE SHIELD

## 2015-10-04 ENCOUNTER — Ambulatory Visit: Payer: BLUE CROSS/BLUE SHIELD | Admitting: Emergency Medicine

## 2015-10-16 ENCOUNTER — Ambulatory Visit (INDEPENDENT_AMBULATORY_CARE_PROVIDER_SITE_OTHER): Payer: BLUE CROSS/BLUE SHIELD

## 2015-10-16 DIAGNOSIS — H539 Unspecified visual disturbance: Secondary | ICD-10-CM | POA: Diagnosis not present

## 2015-10-16 DIAGNOSIS — R42 Dizziness and giddiness: Secondary | ICD-10-CM | POA: Diagnosis not present

## 2015-10-16 MED ORDER — GADOPENTETATE DIMEGLUMINE 469.01 MG/ML IV SOLN: 15.0000 mL | Freq: Once | INTRAVENOUS | Status: AC | PRN

## 2015-11-07 ENCOUNTER — Encounter: Payer: Self-pay | Admitting: Diagnostic Neuroimaging

## 2015-11-07 ENCOUNTER — Ambulatory Visit (INDEPENDENT_AMBULATORY_CARE_PROVIDER_SITE_OTHER): Payer: BLUE CROSS/BLUE SHIELD | Admitting: Diagnostic Neuroimaging

## 2015-11-07 VITALS — BP 121/82 | HR 81

## 2015-11-07 DIAGNOSIS — R42 Dizziness and giddiness: Secondary | ICD-10-CM | POA: Diagnosis not present

## 2015-11-07 DIAGNOSIS — R930 Abnormal findings on diagnostic imaging of skull and head, not elsewhere classified: Secondary | ICD-10-CM | POA: Diagnosis not present

## 2015-11-07 DIAGNOSIS — R269 Unspecified abnormalities of gait and mobility: Secondary | ICD-10-CM | POA: Diagnosis not present

## 2015-11-07 DIAGNOSIS — R9089 Other abnormal findings on diagnostic imaging of central nervous system: Secondary | ICD-10-CM

## 2015-11-07 NOTE — Patient Instructions (Signed)
I will check MRI cervical and thoracic spine.  I will check visual evoke potentials.

## 2015-11-07 NOTE — Progress Notes (Signed)
[facility]  PATIENT: Michelle [name] DOB: 1961-08-02  REFERRING CLINICIAN: Daub HISTORY FROM: patient  REASON FOR VISIT: follow up    HISTORICAL  CHIEF COMPLAINT:  Chief Complaint  Patient presents with  . Dizziness and giddiness    rm 7  . Follow-up    2 month    HISTORY OF PRESENT ILLNESS:   UPDATE 11/07/15: Since last visit, stable sxs. Still feels unsteady with gait, esp uneven surfaces and dim lighting situations. In last 1 year has been more "jumpy" and easily startled.   PRIOR HPI (09/17/15): 54 year old right-handed female here for evaluation of vertigo, vision changes, nausea, constipation, balance problems, foot pain, eye pain. Patient has history of asthma, papillary carcinoma of thyroid, hyper sensitivity to multiple medications, glaucoma, hypercholesteremia, depression. Around age 14 years old patient had onset of numerous constellation of symptoms including GI problems such as nausea, constipation, acid reflux. She also had intermittent episodes of dizziness and vertigo. She would have intermittent episodes of vision changes, feeling of her visual field moving in front of her. Her balance was poor at times. Symptoms would occur for hours or days at a time. Sometimes she would have 3-6 months of significant symptoms followed by several months of remission of symptoms. Around age 54 years old patient sought medical attention. She was ultimately referred to a neurologist who ordered a CT scan of the head which showed "shadows and plaques" and possibility of multiple sclerosis was raised. However patient was in the process of moving from [place] to [place] at that time and did not follow-up with a neurologist in [place]. This was around 1999. Over the past 17 years patient had continued to have similar symptoms. She's been following up with primary care and GI specialist. She decided not to pursue neurologic follow-up. However within the last few  months her sister's daughter (the patient's niece; who also happens to be a patient of mine) was diagnosed with multiple sclerosis by me. Therefore the patient's niece mentioned to the patient to come and get a neurologic evaluation because they felt that their symptoms were quite similar.   REVIEW OF SYSTEMS: Full 14 system review of systems performed and notable only for numbness light sens cough aching muscles restless legs insomnia excessive sweating ringing in ears runny nose.    ALLERGIES: Allergies  Allergen Reactions  . Clarithromycin Anaphylaxis  . Lunesta [Eszopiclone] Other (See Comments)    Causes sleep walking events  . Nutritional Supplements Anaphylaxis  . Pneumococcal Vaccines Anaphylaxis  . Sulfamethoxazole-Trimethoprim Anaphylaxis  . Tetanus Toxoids Swelling  . Wellbutrin [Bupropion Hcl] Other (See Comments)    seizure  . Zolpidem     Causes sleep walking events  . Bactrim Hives and Other (See Comments)    Severe headache  . Clarithromycin Itching and Other (See Comments)    Severe headache  . Doxycycline Other (See Comments)    Severe GI upset  . Hydone [Chlorthalidone] Other (See Comments)    Vasculitis   . Ranitidine Other (See Comments)    Acts like a diuretic   . Reglan [Metoclopramide] Other (See Comments)    Tremors and ticks, possible permanence of effects  . Sulfa Antibiotics Hives and Other (See Comments)    Severe headache  . Tetracyclines & Related Other (See Comments)    Severe GI upset    HOME MEDICATIONS: Outpatient Prescriptions Prior to Visit  Medication Sig Dispense Refill  . acyclovir ointment (ZOVIRAX) 5 % Apply 1 application topically  every 3 (three) hours. 5 g 2  . diclofenac sodium (VOLTAREN) 1 % GEL Apply 2 g topically 4 (four) times daily as needed (pain).     Marland Kitchen EPINEPHrine 0.3 mg/0.3 mL IJ SOAJ injection Inject 0.3 mLs (0.3 mg total) into the muscle once. 2 Device 2  . estradiol (VIVELLE-DOT) 0.075 MG/24HR Place 1 patch onto the  skin 2 (two) times a week. 8 patch 12  . ezetimibe (ZETIA) 10 MG tablet Take 1 tablet (10 mg total) by mouth daily. 90 tablet 2  . lansoprazole (PREVACID SOLUTAB) 30 MG disintegrating tablet Take 1 tablet (30 mg total) by mouth 2 (two) times daily. Take 30- 60 min before your first and last meals of the day 180 tablet 2  . latanoprost (XALATAN) 0.005 % ophthalmic solution USE 1 DROP IN EACH EYE AT BEDTIME AS DIRECTED  6  . levothyroxine (SYNTHROID) 100 MCG tablet Take 1 tablet (100 mcg total) by mouth daily before breakfast. 90 tablet 1  . metoprolol succinate (TOPROL-XL) 25 MG 24 hr tablet TAKE ONE TAB BY MOUTH EVERY MORN, IF BLOOD PRESSURE STILL ELEVATED TAKE 1 TAB IN AFTERNOON. 180 tablet 2  . mometasone (NASONEX) 50 MCG/ACT nasal spray 2 sprays into each nares once a day 17 g 11  . ondansetron (ZOFRAN ODT) 4 MG disintegrating tablet Take 1 tablet (4 mg total) by mouth every 8 (eight) hours as needed for nausea or vomiting. 20 tablet 2  . PRESCRIPTION MEDICATION Place 1 drop into both eyes every morning. Eye drop for glaucoma    . rosuvastatin (CRESTOR) 20 MG tablet Take 1 tablet (20 mg total) by mouth daily. 90 tablet 2  . sucralfate (CARAFATE) 1 GM/10ML suspension Take 2 teaspoonfuls twice a day on empty stomach to help with reflux 420 mL 2  . valACYclovir (VALTREX) 500 MG tablet Take 500 mg by mouth 2 (two) times daily as needed (fever blisters).    . VENTOLIN HFA 108 (90 BASE) MCG/ACT inhaler INHALE TWO PUFFS BY MOUTH EVERY FOUR HOURS AS NEEDED FOR WHEEZING, SHORTNESS OF BREATH OR COUGH 18 Inhaler 1  . vitamin B-12 (CYANOCOBALAMIN) 1000 MCG tablet Take 1,000 mcg by mouth daily.       Facility-Administered Medications Prior to Visit  Medication Dose Route Frequency Provider Last Rate Last Dose  . gadopentetate dimeglumine (MAGNEVIST) injection 15 mL  15 mL Intravenous Once PRN Penni Bombard, MD        PAST MEDICAL HISTORY: Past Medical History  Diagnosis Date  . Hypercholesteremia     . Reflux   . Gastroparesis   . VAIN I (vaginal intraepithelial neoplasia grade I) 09/2013, 11/2014     with positive high-risk HPV screen   . GERD (gastroesophageal reflux disease)   . Hypertension   . Osteopenia 06/2013    T score -1.2 FRAX 8.6%/0.6%  . Hypothyroidism   . Thyroid cancer ([place])     papillary  . PONV (postoperative nausea and vomiting)   . Osteoarthritis   . Asthma   . Gastroparesis     history of  . Headache(784.0)     migraines  . Seizures ([place])     1 seizure secondary to wellbutrin-none since  . Allergy   . Anaphylaxis   . Depression   . Overdose, drug 10/30/2011    PAST SURGICAL HISTORY: Past Surgical History  Procedure Laterality Date  . Cosmetic surgery      buttock liposuction  . Thyroid surgery      for thyroid cancer  .  Nissen fundoplication  [id number]    REFLUX  . Hernia repair  2004, 2012  . Breast surgery      reduc tion mammoplasty  . Cholecystectomy    . Upper gastrointestinal endoscopy  08/26/2004  . Varicose vein surgery  03/20/2003    right leg  . Colposcopy  02/2012    VAIN I  . Vaginal hysterectomy  2007    irregular bleeding    FAMILY HISTORY: Family History  Problem Relation Age of Onset  . Heart disease Mother   . Hypertension Mother   . Heart disease Father   . Hypertension Father   . Emphysema Father 44    was a smoker  . Heart disease Sister   . Hypertension Sister   . Diabetes Paternal Grandmother   . Multiple sclerosis Other   . Cancer Maternal Aunt     leukemia  . Cancer Maternal Grandmother   . Heart disease Brother   . Leukemia Maternal Grandfather     SOCIAL HISTORY:  Social History   Social History  . Marital Status: Married    Spouse Name: [name]  . Number of Children: 1  . Years of Education: College   Occupational History  . homemaker    Social History Main Topics  . Smoking status: Never Smoker   . Smokeless tobacco: Never Used  . Alcohol Use: No  . Drug Use: No  . Sexual Activity:     Partners: Male    Birth Control/ Protection: Surgical   Other Topics Concern  . Not on file   Social History Narrative   Exercise: Yes.   Lives with her husband. Marital discord.   Receiving therapy with Vivia Budge.   Son lives locally.     PHYSICAL EXAM  GENERAL EXAM/CONSTITUTIONAL: Vitals:  Filed Vitals:   11/07/15 1406  BP: 121/82  Pulse: 81   There is no weight on file to calculate BMI. No exam data present  Patient is in no distress; well developed, nourished and groomed; neck is supple  CARDIOVASCULAR:  Examination of carotid arteries is normal; no carotid bruits  Regular rate and rhythm, no murmurs  Examination of peripheral vascular system by observation and palpation is normal  EYES:  Ophthalmoscopic exam of optic discs and posterior segments is normal; no papilledema or hemorrhages  MUSCULOSKELETAL:  Gait, strength, tone, movements noted in Neurologic exam below  NEUROLOGIC: MENTAL STATUS:  No flowsheet data found.  awake, alert, oriented to person, place and time  recent and remote memory intact  normal attention and concentration  language fluent, comprehension intact, naming intact,   fund of knowledge appropriate  CRANIAL NERVE:   2nd - no papilledema on fundoscopic exam; INCREASED LIGHT BRIGHTNESS IN LEFT EYE  2nd, 3rd, 4th, 6th - pupils equal and reactive to light, visual fields full to confrontation, extraocular muscles intact no nystagmus  5th - facial sensation symmetric  7th - facial strength symmetric  8th - hearing intact  9th - palate elevates symmetrically, uvula midline  11th - shoulder shrug symmetric  12th - tongue protrusion midline  MOTOR:   normal bulk and tone, full strength in the BUE, BLE; SLIGHTLY DECR RIGHT FOOT DORSIFLEXION  SENSORY:   normal and symmetric to light touch, temperature, vibration  COORDINATION:   finger-nose-finger, fine finger movements --> SLOW; MILD DYSMETRIA ON LEFT >  RIGHT HAND  REFLEXES:   deep tendon reflexes present and symmetric  GAIT/STATION:   narrow based gait; UNSTEADY GAIT; DIFFICULTY WITH TANDEM  DIAGNOSTIC DATA (LABS, IMAGING, TESTING) - I reviewed patient records, labs, notes, testing and imaging myself where available.  Lab Results  Component Value Date   WBC 9.5 07/31/2015   HGB 14.5 07/31/2015   HCT 42.8 07/31/2015   MCV 92.2 07/31/2015   PLT 268 07/31/2015      Component Value Date/Time   NA 141 07/31/2015 0816   NA 143 10/18/2012 1526   K 4.1 07/31/2015 0816   K 3.4* 10/18/2012 1526   CL 104 07/31/2015 0816   CL 106 10/18/2012 1526   CO2 27 07/31/2015 0816   CO2 28 10/18/2012 1526   GLUCOSE 92 07/31/2015 0816   GLUCOSE 109* 10/18/2012 1526   BUN 11 07/31/2015 0816   BUN 7.0 10/18/2012 1526   CREATININE 0.69 07/31/2015 0816   CREATININE 0.67 05/29/2015 0101   CREATININE 0.8 10/18/2012 1526   CALCIUM 9.3 07/31/2015 0816   CALCIUM 9.2 10/18/2012 1526   PROT 6.7 07/31/2015 0816   PROT 7.1 10/18/2012 1526   ALBUMIN 4.4 07/31/2015 0816   ALBUMIN 3.8 10/18/2012 1526   AST 28 07/31/2015 0816   AST 34 10/18/2012 1526   ALT 27 07/31/2015 0816   ALT 36 10/18/2012 1526   ALKPHOS 48 07/31/2015 0816   ALKPHOS 69 10/18/2012 1526   BILITOT 0.6 07/31/2015 0816   BILITOT 0.52 10/18/2012 1526   GFRNONAA >60 05/29/2015 0101   GFRNONAA >89 12/17/2014 1113   GFRAA >60 05/29/2015 0101   GFRAA >89 12/17/2014 1113   Lab Results  Component Value Date   CHOL 170 07/31/2015   HDL 50 07/31/2015   LDLCALC 83 07/31/2015   TRIG 185* 07/31/2015   CHOLHDL 3.4 07/31/2015   No results found for: HGBA1C No results found for: [id number] Lab Results  Component Value Date   TSH 1.498 07/31/2015    10/16/15 MRI BRAIN (with and without) [I reviewed images myself and agree with interpretation. -VRP]  1.There are no acute findings and no enhancing lesions. 2.There are scattered small round T2/FLAIR hyperintense foci,  predominantly in the subcortical and deep white matter of the frontal lobes. One focus is in the corpus callosum and another is pericallosal. They do not appear to be acute. These are nonspecific foci and could be idiopathic or due to chronic microvascular ischemic change, cardio emboli, migraine or be the sequela of remote infection, inflammation or trauma. The pattern is not characteristic for demyelination.Comparison with prior MRIs difficult due to metal artifact and some progression may have occurred.    ASSESSMENT AND PLAN  54 y.o. year old female here with constellation of neurologic and GI symptoms since age 20 years old, with relapsing and remitting pattern of symptoms. Apparently patient had abnormal CT scan in 1999 raising possibility of multiple sclerosis. MRI brain also shows several lesions, but non-specific. Will pursue further workup with MRI cervial and thoracic spine and visual evoked potentials. Then may consider lumbar puncture.    Dx:  Dizziness and giddiness - Plan: MR Cervical Spine W Wo Contrast, MR Thoracic Spine W Wo Contrast, Visual evoked potential test  Gait difficulty - Plan: MR Cervical Spine W Wo Contrast, MR Thoracic Spine W Wo Contrast, Visual evoked potential test  Abnormal brain MRI     PLAN:  Orders Placed This Encounter  Procedures  . MR Cervical Spine W Wo Contrast  . MR Thoracic Spine W Wo Contrast  . Visual evoked potential test   Return in about 6 weeks (around 12/19/2015).    Penni Bombard,  MD [id number], [id number] PM Certified in Neurology, Neurophysiology and Neuroimaging  [facility] [address] [place], [place] [address] [phone]

## 2015-11-12 ENCOUNTER — Ambulatory Visit (INDEPENDENT_AMBULATORY_CARE_PROVIDER_SITE_OTHER): Payer: BLUE CROSS/BLUE SHIELD

## 2015-11-12 ENCOUNTER — Ambulatory Visit (INDEPENDENT_AMBULATORY_CARE_PROVIDER_SITE_OTHER): Payer: Self-pay

## 2015-11-12 DIAGNOSIS — R42 Dizziness and giddiness: Secondary | ICD-10-CM

## 2015-11-12 DIAGNOSIS — Z0289 Encounter for other administrative examinations: Secondary | ICD-10-CM

## 2015-11-12 DIAGNOSIS — R269 Unspecified abnormalities of gait and mobility: Secondary | ICD-10-CM

## 2015-11-19 ENCOUNTER — Telehealth: Payer: Self-pay | Admitting: *Deleted

## 2015-11-19 ENCOUNTER — Ambulatory Visit (INDEPENDENT_AMBULATORY_CARE_PROVIDER_SITE_OTHER): Payer: BLUE CROSS/BLUE SHIELD | Admitting: Diagnostic Neuroimaging

## 2015-11-19 ENCOUNTER — Other Ambulatory Visit: Payer: Self-pay | Admitting: Emergency Medicine

## 2015-11-19 ENCOUNTER — Telehealth: Payer: Self-pay

## 2015-11-19 DIAGNOSIS — R42 Dizziness and giddiness: Secondary | ICD-10-CM

## 2015-11-19 DIAGNOSIS — E041 Nontoxic single thyroid nodule: Secondary | ICD-10-CM

## 2015-11-19 DIAGNOSIS — R269 Unspecified abnormalities of gait and mobility: Secondary | ICD-10-CM

## 2015-11-19 NOTE — Telephone Encounter (Signed)
Patient is calling back to make sure Dr. Everlene Farrier and Dr. Tamala Julian gets this message. She states that she was got her thyroid check and a nodule was found. She states that this is an urgent matter

## 2015-11-19 NOTE — Telephone Encounter (Signed)
Received call back from patient. Informed her, Per Dr Leta Baptist her cervical spine scan is normal but right thyroid cyst noted.  Advised she FU with PCP or ENT. She requested results of T spine scan; advised those results are normal.  She stated she would call Dr Everlene Farrier, PCP right away re: thyroid cyst. She verbalized understanding, appreciation for call.

## 2015-11-19 NOTE — Telephone Encounter (Signed)
Tried home phone, no answer, no voice mail to leave message.

## 2015-11-19 NOTE — Telephone Encounter (Signed)
Pt had a scan done and she would like to speak with Dr Everlene Farrier about it. Please call [phone]

## 2015-11-19 NOTE — Telephone Encounter (Signed)
Noted.  Message reviewed in detail. MRI cervical spine results reviewed. Agree with assessment and plan as outlined by Dr. Everlene Farrier.

## 2015-11-19 NOTE — Telephone Encounter (Signed)
I called the patient and discussed the situation with her. She used to see Dr. Chalmers Cater but  she  no longer sees her. her last endocrine visit was with Dr. Loanne Drilling a couple of years ago. Her papillary thyroid cancer was in 2004. We'll go ahead and repeat her ultrasound . She did have a thyroglobulin level done 6 months ago and this was okay. She has chosen you as her PCP so I wanted you to be aware of this.

## 2015-11-20 ENCOUNTER — Ambulatory Visit
Admission: RE | Admit: 2015-11-20 | Discharge: 2015-11-20 | Disposition: A | Discharge status: Home / Self Care | Payer: BLUE CROSS/BLUE SHIELD | Source: Ambulatory Visit | Attending: Emergency Medicine | Admitting: Emergency Medicine

## 2015-11-20 DIAGNOSIS — E041 Nontoxic single thyroid nodule: Secondary | ICD-10-CM

## 2015-11-20 NOTE — Procedures (Signed)
    [facility]  VEP (VISUAL EVOKED POTENTIAL) REPORT   STUDY DATE: 11/19/15 PATIENT NAME: Michelle [name] DOB: 1961/10/15 MRN: [id number]  ORDERING CLINICIAN: Andrey Spearman, MD   TECHNOLOGIST: Laretta Alstrom   TECHNIQUE: The visual evoked potential test was performed using 32 x 32 check sizes with full pattern reversal. CLINICAL INFORMATION: 54 year old female with abnormal MRI brain. Evaluate for demyelinating disease.  FINDINGS: The visual acuity was 20/30 OD and 20/20 OS.  There are well formed evoked potential wave forms bilaterally.   P100 latency with right eye stimulation: 103 ms.   P100 latency with left eye stimulation: 98 ms.  The amplitudes for the P100 waveforms were also within normal limits bilaterally.    IMPRESSION: This visual evoked potential study is normal. No definite evidence of conduction problems of the visual pathways at this time.     INTERPRETING PHYSICIAN:  Penni Bombard, MD Certified in Neurology, Neurophysiology and Neuroimaging  [facility] [address] [place], [place] [address] [phone]

## 2015-11-26 ENCOUNTER — Other Ambulatory Visit: Payer: Self-pay | Admitting: *Deleted

## 2015-11-28 ENCOUNTER — Other Ambulatory Visit: Payer: Self-pay | Admitting: Emergency Medicine

## 2015-11-28 DIAGNOSIS — C73 Malignant neoplasm of thyroid gland: Secondary | ICD-10-CM

## 2015-12-08 ENCOUNTER — Ambulatory Visit (INDEPENDENT_AMBULATORY_CARE_PROVIDER_SITE_OTHER): Payer: BLUE CROSS/BLUE SHIELD | Admitting: Physician Assistant

## 2015-12-08 VITALS — BP 124/80 | HR 105 | Temp 97.6°F | Resp 20 | Ht 64.0 in

## 2015-12-08 DIAGNOSIS — J209 Acute bronchitis, unspecified: Secondary | ICD-10-CM

## 2015-12-08 DIAGNOSIS — J040 Acute laryngitis: Secondary | ICD-10-CM

## 2015-12-08 MED ORDER — HYDROCOD POLST-CPM POLST ER 10-8 MG/5ML PO SUER: 5.0000 mL | Freq: Two times a day (BID) | ORAL | Status: DC | PRN

## 2015-12-08 MED ORDER — AZITHROMYCIN 250 MG PO TABS: ORAL_TABLET | ORAL | Status: AC

## 2015-12-08 MED ORDER — PREDNISONE 20 MG PO TABS: ORAL_TABLET | ORAL | Status: DC

## 2015-12-08 NOTE — Progress Notes (Signed)
Patient ID: Michelle [name], female    DOB: Apr 20, 1961, 55 y.o.   MRN: [id number]  PCP: Jenny Reichmann, MD  Subjective:   Chief Complaint  Patient presents with  . Cough    x 2 week  . Laryngitis    HPI Presents for evaluation of cough x 2 weeks.  "It's not letting up. Keeping me up." Not able to sleep. Pain in the chest wall with coughing. Worse on the LEFT. Coughs to gag/vomit. No nausea. Produces green sputum, thick.  Mild nasal congestion in the morning, usually clear. No fever, chills. But feels hot. Lost her voice on day two, which is typical for her with similar illnesses. No diarrhea.  "My mother in law is now living with me" (her father-in-law died on 2015-12-12, her mother-in-law moved in on 11/23/2015 after realizing that she couldn't take care of herself). Their home has had a lot of mold problems.  Review of Systems  As above.   Patient Active Problem List   Diagnosis Date Noted  . Dizziness and giddiness 11/07/2015  . Gait difficulty 11/07/2015  . Abnormal brain MRI 11/07/2015  . Cough 04/02/2013  . Abnormal CXR 04/02/2013  . Leukocytosis 10/18/2012  . History of anaphylaxis 03/01/2012  . Depression 10/30/2011  . H/O suicide attempt 10/30/2011  . Hx of thyroid cancer   . Hypothyroidism   . Hypertension   . Asthma   . Gastroparesis   . Hypercholesteremia   . VAIN (vaginal intraepithelial neoplasia)   . GERD (gastroesophageal reflux disease) 07/16/2011     Prior to Admission medications   Medication Sig Start Date End Date Taking? Authorizing Provider  EPINEPHrine 0.3 mg/0.3 mL IJ SOAJ injection Inject 0.3 mLs (0.3 mg total) into the muscle once. 10/24/14  Yes Darlyne Russian, MD  estradiol (VIVELLE-DOT) 0.075 MG/24HR Place 1 patch onto the skin 2 (two) times a week. 12/04/14  Yes Anastasio Auerbach, MD  ezetimibe (ZETIA) 10 MG tablet Take 1 tablet (10 mg total) by mouth daily. 08/01/15  Yes Darlyne Russian, MD  lansoprazole (PREVACID SOLUTAB) 30 MG  disintegrating tablet Take 1 tablet (30 mg total) by mouth 2 (two) times daily. Take 30- 60 min before your first and last meals of the day 08/01/15  Yes Darlyne Russian, MD  levothyroxine (SYNTHROID) 100 MCG tablet Take 1 tablet (100 mcg total) by mouth daily before breakfast. 09/30/15  Yes Wardell Honour, MD  metoprolol succinate (TOPROL-XL) 25 MG 24 hr tablet TAKE ONE TAB BY MOUTH EVERY MORN, IF BLOOD PRESSURE STILL ELEVATED TAKE 1 TAB IN AFTERNOON. 08/01/15  Yes Darlyne Russian, MD  ondansetron (ZOFRAN ODT) 4 MG disintegrating tablet Take 1 tablet (4 mg total) by mouth every 8 (eight) hours as needed for nausea or vomiting. 08/01/15  Yes Darlyne Russian, MD  rosuvastatin (CRESTOR) 20 MG tablet Take 1 tablet (20 mg total) by mouth daily. 08/01/15  Yes Darlyne Russian, MD  sucralfate (CARAFATE) 1 GM/10ML suspension Take 2 teaspoonfuls twice a day on empty stomach to help with reflux 08/01/15  Yes Darlyne Russian, MD  valACYclovir (VALTREX) 500 MG tablet Take 500 mg by mouth 2 (two) times daily as needed (fever blisters).   Yes Historical Provider, MD  VENTOLIN HFA 108 (90 BASE) MCG/ACT inhaler INHALE TWO PUFFS BY MOUTH EVERY FOUR HOURS AS NEEDED FOR WHEEZING, SHORTNESS OF BREATH OR COUGH 04/25/15  Yes Darlyne Russian, MD  vitamin B-12 (CYANOCOBALAMIN) 1000 MCG tablet Take 1,000 mcg  by mouth daily.     Yes Historical Provider, MD  acyclovir ointment (ZOVIRAX) 5 % Apply 1 application topically every 3 (three) hours. Patient not taking: Reported on 12/08/2015 01/04/15   Darlyne Russian, MD  diclofenac sodium (VOLTAREN) 1 % GEL Apply 2 g topically 4 (four) times daily as needed (pain). Reported on 12/08/2015    Historical Provider, MD  latanoprost (XALATAN) 0.005 % ophthalmic solution Reported on 12/08/2015 06/10/15   Historical Provider, MD  mometasone (NASONEX) 50 MCG/ACT nasal spray 2 sprays into each nares once a day Patient not taking: Reported on 12/08/2015 12/24/14   Darlyne Russian, MD  PRESCRIPTION MEDICATION Place 1 drop into  both eyes every morning. Reported on 12/08/2015    Historical Provider, MD     Allergies  Allergen Reactions  . Clarithromycin Anaphylaxis  . Lunesta [Eszopiclone] Other (See Comments)    Causes sleep walking events  . Nutritional Supplements Anaphylaxis  . Pneumococcal Vaccines Anaphylaxis  . Sulfamethoxazole-Trimethoprim Anaphylaxis  . Tetanus Toxoids Swelling  . Wellbutrin [Bupropion Hcl] Other (See Comments)    seizure  . Zolpidem     Causes sleep walking events  . Bactrim Hives and Other (See Comments)    Severe headache  . Clarithromycin Itching and Other (See Comments)    Severe headache  . Doxycycline Other (See Comments)    Severe GI upset  . Hydone [Chlorthalidone] Other (See Comments)    Vasculitis   . Ranitidine Other (See Comments)    Acts like a diuretic   . Reglan [Metoclopramide] Other (See Comments)    Tremors and ticks, possible permanence of effects  . Sulfa Antibiotics Hives and Other (See Comments)    Severe headache  . Tetracyclines & Related Other (See Comments)    Severe GI upset       Objective:  Physical Exam  Constitutional: She is oriented to person, place, and time. She appears well-developed and well-nourished. No distress.  BP 124/80 mmHg  Pulse 105  Temp(Src) 97.6 F (36.4 C) (Oral)  Resp 20  Ht 5\' 4"  (1.626 m)  Wt   SpO2 97%  LMP 05/23/2006   HENT:  Head: Normocephalic and atraumatic.  Right Ear: Hearing, tympanic membrane, external ear and ear canal normal.  Left Ear: Hearing, tympanic membrane, external ear and ear canal normal.  Nose: Mucosal edema and rhinorrhea present.  No foreign bodies. Right sinus exhibits no maxillary sinus tenderness and no frontal sinus tenderness. Left sinus exhibits no maxillary sinus tenderness and no frontal sinus tenderness.  Mouth/Throat: Uvula is midline, oropharynx is clear and moist and mucous membranes are normal. No uvula swelling. No oropharyngeal exudate.  Eyes: Conjunctivae and EOM are  normal. Pupils are equal, round, and reactive to light. Right eye exhibits no discharge. Left eye exhibits no discharge. No scleral icterus.  Neck: Normal range of motion and full passive range of motion without pain. Neck supple. No thyroid mass and no thyromegaly present.  Voice is very hoarse.  Cardiovascular: Normal rate, regular rhythm and normal heart sounds.   Pulmonary/Chest: Effort normal and breath sounds normal.  Lymphadenopathy:       Head (right side): No submandibular, no tonsillar, no preauricular, no posterior auricular and no occipital adenopathy present.       Head (left side): No submandibular, no tonsillar, no preauricular and no occipital adenopathy present.    She has no cervical adenopathy.       Right: No supraclavicular adenopathy present.  Left: No supraclavicular adenopathy present.  Neurological: She is alert and oriented to person, place, and time. She has normal strength. No cranial nerve deficit or sensory deficit.  Skin: Skin is warm, dry and intact. No rash noted.  Psychiatric: She has a normal mood and affect. Her speech is normal and behavior is normal.           Assessment & Plan:   1. Acute bronchitis, unspecified organism 2. Laryngitis Previously had good results for these symptoms with Zpak, oral prednisone taper (though she doesn't tolerate doses higher than 50 mg due to GI upset), and Tussionex. Proceed with that. In addition, rest, voice rest and oral hydration. - azithromycin (ZITHROMAX) 250 MG tablet; Take 2 tabs PO x 1 dose, then 1 tab PO QD x 4 days  Dispense: 6 tablet; Refill: 0 - chlorpheniramine-HYDROcodone (TUSSIONEX PENNKINETIC ER) 10-8 MG/5ML SUER; Take 5 mLs by mouth every 12 (twelve) hours as needed for cough.  Dispense: 100 mL; Refill: 0 - predniSONE (DELTASONE) 20 MG tablet; Take 2.5 PO QAM x 4 days, then 2 PO QAM x 3 days, then 1 PO QAM x3days  Dispense: 19 tablet; Refill: 0    Fara Chute, PA-C Physician  Assistant-Certified [facility]

## 2015-12-08 NOTE — Patient Instructions (Signed)
Get plenty of rest and drink at least 64 ounces of water daily. 

## 2015-12-09 ENCOUNTER — Telehealth: Payer: Self-pay

## 2015-12-09 NOTE — Telephone Encounter (Signed)
[facility] sent fax advising that plan only covers 1 tab daily. Completed PA on covermymeds. Pending.

## 2015-12-09 NOTE — Telephone Encounter (Signed)
Got fax from Ins stating that no PA is needed, it is a covered med. Fax stated that if pharm has trouble, they should call pharm help desk for override. Faxed the letter to pharm.

## 2015-12-15 ENCOUNTER — Other Ambulatory Visit: Payer: Self-pay | Admitting: Emergency Medicine

## 2015-12-15 ENCOUNTER — Ambulatory Visit (INDEPENDENT_AMBULATORY_CARE_PROVIDER_SITE_OTHER): Payer: BLUE CROSS/BLUE SHIELD | Admitting: Emergency Medicine

## 2015-12-15 VITALS — BP 112/80 | HR 84 | Temp 97.9°F | Resp 16 | Ht 64.0 in | Wt 136.0 lb

## 2015-12-15 DIAGNOSIS — Z111 Encounter for screening for respiratory tuberculosis: Secondary | ICD-10-CM

## 2015-12-15 DIAGNOSIS — Z0282 Encounter for adoption services: Secondary | ICD-10-CM | POA: Diagnosis not present

## 2015-12-15 DIAGNOSIS — Z7681 Expectant parent(s) prebirth pediatrician visit: Secondary | ICD-10-CM

## 2015-12-15 DIAGNOSIS — Z0000 Encounter for general adult medical examination without abnormal findings: Secondary | ICD-10-CM

## 2015-12-15 LAB — POCT URINALYSIS DIP (MANUAL ENTRY)
BILIRUBIN UA: NEGATIVE
GLUCOSE UA: NEGATIVE
Leukocytes, UA: NEGATIVE
Nitrite, UA: NEGATIVE
RBC UA: NEGATIVE
UROBILINOGEN UA: 0.2
pH, UA: 5

## 2015-12-15 LAB — POC MICROSCOPIC URINALYSIS (UMFC)

## 2015-12-15 LAB — LIPID PANEL
CHOL/HDL RATIO: 2.8 ratio (ref <=5.0)
CHOLESTEROL: 184 mg/dL (ref 125–200)
HDL: 65 mg/dL (ref >=46)
LDL CALC: 68 mg/dL (ref <130)
TRIGLYCERIDES: 254 mg/dL — AB (ref <150)
VLDL: 51 mg/dL — AB (ref <30)

## 2015-12-15 LAB — POCT CBC
GRANULOCYTE PERCENT: 71.6 % (ref 37–80)
HEMATOCRIT: 41.5 % (ref 37.7–47.9)
Hemoglobin: 14.6 g/dL (ref 12.2–16.2)
Lymph, poc: 5.3 — AB (ref 0.6–3.4)
MCH: 32.1 pg — AB (ref 27–31.2)
MCHC: 35.2 g/dL (ref 31.8–35.4)
MCV: 91.1 fL (ref 80–97)
MID (CBC): 1.3 — AB (ref 0–0.9)
MPV: 6.3 fL (ref 0–99.8)
PLATELET COUNT, POC: 304 10*3/uL (ref 142–424)
POC GRANULOCYTE: 16.6 — AB (ref 2–6.9)
POC LYMPH PERCENT: 22.7 %L (ref 10–50)
POC MID %: 5.7 %M (ref 0–12)
RBC: 4.55 M/uL (ref 4.04–5.48)
RDW, POC: 12.1 %
WBC: 23.2 10*3/uL — AB (ref 4.6–10.2)

## 2015-12-15 LAB — TSH: TSH: 0.094 u[IU]/mL — ABNORMAL LOW (ref 0.350–4.500)

## 2015-12-15 LAB — HIV ANTIBODY (ROUTINE TESTING W REFLEX): HIV 1&2 Ab, 4th Generation: NONREACTIVE

## 2015-12-15 LAB — HEPATITIS B SURFACE ANTIGEN: Hepatitis B Surface Ag: NEGATIVE

## 2015-12-15 NOTE — Progress Notes (Addendum)
By signing my name below, I, Judithe Modest, attest that this documentation has been prepared under the direction and in the presence of Nena Jordan, MD. Electronically Signed: Judithe Modest, ER Scribe. 12/15/2015. 8:46 AM.  Chief Complaint:  Chief Complaint  Patient presents with  . Forms Completed    Adoptions papers    HPI: Michelle [name] is a 55 y.o. female who reports to [facility] today to have health forms reviewed for adoption proceedings.    Past Medical History  Diagnosis Date  . Hypercholesteremia   . Reflux   . Gastroparesis   . VAIN I (vaginal intraepithelial neoplasia grade I) 09/2013, 11/2014     with positive high-risk HPV screen   . GERD (gastroesophageal reflux disease)   . Hypertension   . Osteopenia 06/2013    T score -1.2 FRAX 8.6%/0.6%  . Hypothyroidism   . Thyroid cancer ([place])     papillary  . PONV (postoperative nausea and vomiting)   . Osteoarthritis   . Asthma   . Gastroparesis     history of  . Headache(784.0)     migraines  . Seizures ([place])     1 seizure secondary to wellbutrin-none since  . Allergy   . Anaphylaxis   . Depression   . Overdose, drug 10/30/2011   Past Surgical History  Procedure Laterality Date  . Cosmetic surgery      buttock liposuction  . Thyroid surgery      for thyroid cancer  . Nissen fundoplication  [id number]    REFLUX  . Hernia repair  2004, 2012  . Breast surgery      reduc tion mammoplasty  . Cholecystectomy    . Upper gastrointestinal endoscopy  08/26/2004  . Varicose vein surgery  03/20/2003    right leg  . Colposcopy  02/2012    VAIN I  . Vaginal hysterectomy  2007    irregular bleeding   Social History   Social History  . Marital Status: Married    Spouse Name: [name]  . Number of Children: 1  . Years of Education: College   Occupational History  . homemaker    Social History Main Topics  . Smoking status: Never Smoker   . Smokeless tobacco: Never Used  . Alcohol Use: No  . Drug Use:  No  . Sexual Activity:    Partners: Male    Birth Control/ Protection: Surgical   Other Topics Concern  . None   Social History Narrative   Exercise: Yes.   Lives with her husband. Marital discord.   Receiving therapy with Vivia Budge.   Son lives locally.   Family History  Problem Relation Age of Onset  . Heart disease Mother   . Hypertension Mother   . Heart disease Father   . Hypertension Father   . Emphysema Father 36    was a smoker  . Heart disease Sister   . Hypertension Sister   . Diabetes Paternal Grandmother   . Multiple sclerosis Other   . Cancer Maternal Aunt     leukemia  . Cancer Maternal Grandmother   . Heart disease Brother   . Leukemia Maternal Grandfather    Allergies  Allergen Reactions  . Clarithromycin Anaphylaxis  . Lunesta [Eszopiclone] Other (See Comments)    Causes sleep walking events  . Nutritional Supplements Anaphylaxis  . Pneumococcal Vaccines Anaphylaxis  . Sulfamethoxazole-Trimethoprim Anaphylaxis  . Tetanus Toxoids Swelling  . Wellbutrin [Bupropion Hcl] Other (See Comments)  seizure  . Zolpidem     Causes sleep walking events  . Bactrim Hives and Other (See Comments)    Severe headache  . Clarithromycin Itching and Other (See Comments)    Severe headache  . Doxycycline Other (See Comments)    Severe GI upset  . Hydone [Chlorthalidone] Other (See Comments)    Vasculitis   . Ranitidine Other (See Comments)    Acts like a diuretic   . Reglan [Metoclopramide] Other (See Comments)    Tremors and ticks, possible permanence of effects  . Sulfa Antibiotics Hives and Other (See Comments)    Severe headache  . Tetracyclines & Related Other (See Comments)    Severe GI upset   Prior to Admission medications   Medication Sig Start Date End Date Taking? Authorizing Provider  chlorpheniramine-HYDROcodone (TUSSIONEX PENNKINETIC ER) 10-8 MG/5ML SUER Take 5 mLs by mouth every 12 (twelve) hours as needed for cough. 12/08/15  Yes Chelle  Jeffery, PA-C  diclofenac sodium (VOLTAREN) 1 % GEL Apply 2 g topically 4 (four) times daily as needed (pain). Reported on 12/08/2015   Yes Historical Provider, MD  EPINEPHrine 0.3 mg/0.3 mL IJ SOAJ injection Inject 0.3 mLs (0.3 mg total) into the muscle once. 10/24/14  Yes Darlyne Russian, MD  estradiol (VIVELLE-DOT) 0.075 MG/24HR Place 1 patch onto the skin 2 (two) times a week. 12/04/14  Yes Anastasio Auerbach, MD  ezetimibe (ZETIA) 10 MG tablet Take 1 tablet (10 mg total) by mouth daily. 08/01/15  Yes Darlyne Russian, MD  lansoprazole (PREVACID SOLUTAB) 30 MG disintegrating tablet Take 1 tablet (30 mg total) by mouth 2 (two) times daily. Take 30- 60 min before your first and last meals of the day 08/01/15  Yes Darlyne Russian, MD  latanoprost (XALATAN) 0.005 % ophthalmic solution Reported on 12/08/2015 06/10/15  Yes Historical Provider, MD  levothyroxine (SYNTHROID) 100 MCG tablet Take 1 tablet (100 mcg total) by mouth daily before breakfast. 09/30/15  Yes Wardell Honour, MD  metoprolol succinate (TOPROL-XL) 25 MG 24 hr tablet TAKE ONE TAB BY MOUTH EVERY MORN, IF BLOOD PRESSURE STILL ELEVATED TAKE 1 TAB IN AFTERNOON. 08/01/15  Yes Darlyne Russian, MD  ondansetron (ZOFRAN ODT) 4 MG disintegrating tablet Take 1 tablet (4 mg total) by mouth every 8 (eight) hours as needed for nausea or vomiting. 08/01/15  Yes Darlyne Russian, MD  predniSONE (DELTASONE) 20 MG tablet Take 2.5 PO QAM x 4 days, then 2 PO QAM x 3 days, then 1 PO QAM x3days 12/08/15  Yes Chelle Jeffery, PA-C  PRESCRIPTION MEDICATION Place 1 drop into both eyes every morning. Reported on 12/08/2015   Yes Historical Provider, MD  rosuvastatin (CRESTOR) 20 MG tablet Take 1 tablet (20 mg total) by mouth daily. 08/01/15  Yes Darlyne Russian, MD  sucralfate (CARAFATE) 1 GM/10ML suspension Take 2 teaspoonfuls twice a day on empty stomach to help with reflux 08/01/15  Yes Darlyne Russian, MD  valACYclovir (VALTREX) 500 MG tablet Take 500 mg by mouth 2 (two) times daily as needed  (fever blisters).   Yes Historical Provider, MD  VENTOLIN HFA 108 (90 BASE) MCG/ACT inhaler INHALE TWO PUFFS BY MOUTH EVERY FOUR HOURS AS NEEDED FOR WHEEZING, SHORTNESS OF BREATH OR COUGH 04/25/15  Yes Darlyne Russian, MD  vitamin B-12 (CYANOCOBALAMIN) 1000 MCG tablet Take 1,000 mcg by mouth daily.     Yes Historical Provider, MD  acyclovir ointment (ZOVIRAX) 5 % Apply 1 application topically every 3 (three)  hours. Patient not taking: Reported on 12/08/2015 01/04/15   Darlyne Russian, MD  mometasone (NASONEX) 50 MCG/ACT nasal spray 2 sprays into each nares once a day Patient not taking: Reported on 12/08/2015 12/24/14   Darlyne Russian, MD     ROS: The patient denies fevers, chills, night sweats, unintentional weight loss, chest pain, palpitations, wheezing, dyspnea on exertion, nausea, vomiting, abdominal pain, dysuria, hematuria, melena, numbness, weakness, or tingling.   All other systems have been reviewed and were otherwise negative with the exception of those mentioned in the HPI and as above.    PHYSICAL EXAM: Filed Vitals:   12/15/15 0827  BP: 112/80  Pulse: 84  Temp: 97.9 F (36.6 C)  Resp: 16   Body mass index is 23.33 kg/(m^2).   General: Alert, no acute distress HEENT:  Normocephalic, atraumatic, oropharynx patent. Eye: Juliette Mangle [facility] Cardiovascular:  Regular rate and rhythm, no rubs murmurs or gallops.  No Carotid bruits, radial pulse intact. No pedal edema.  Respiratory: Clear to auscultation bilaterally.  No wheezes, rales, or rhonchi.  No cyanosis, no use of accessory musculature Abdominal: No organomegaly, abdomen is soft and non-tender, positive bowel sounds.  No masses. Musculoskeletal: Gait intact. No edema, tenderness Skin: No rashes. Neurologic: Facial musculature symmetric. Psychiatric: Patient acts appropriately throughout our interaction. Lymphatic: No cervical or submandibular lymphadenopathy    LABS: Results for orders placed or performed in visit on 12/15/15    Hepatitis B surface antigen  Result Value Ref Range   Hepatitis B Surface Ag NEGATIVE NEGATIVE  TSH  Result Value Ref Range   TSH 0.094 (L) 0.350 - 4.500 uIU/mL  Lipid panel  Result Value Ref Range   Cholesterol 184 125 - 200 mg/dL   Triglycerides 254 (H) <150 mg/dL   HDL 65 >=46 mg/dL   Total CHOL/HDL Ratio 2.8 <=5.0 Ratio   VLDL 51 (H) <30 mg/dL   LDL Cholesterol 68 <130 mg/dL  HIV antibody  Result Value Ref Range   HIV 1&2 Ab, 4th Generation NONREACTIVE NONREACTIVE  POCT CBC  Result Value Ref Range   WBC 23.2 (A) 4.6 - 10.2 K/uL   Lymph, poc 5.3 (A) 0.6 - 3.4   POC LYMPH PERCENT 22.7 10 - 50 %L   MID (cbc) 1.3 (A) 0 - 0.9   POC MID % 5.7 0 - 12 %M   POC Granulocyte 16.6 (A) 2 - 6.9   Granulocyte percent 71.6 37 - 80 %G   RBC 4.55 4.04 - 5.48 M/uL   Hemoglobin 14.6 12.2 - 16.2 g/dL   HCT, POC 41.5 37.7 - 47.9 %   MCV 91.1 80 - 97 fL   MCH, POC 32.1 (A) 27 - 31.2 pg   MCHC 35.2 31.8 - 35.4 g/dL   RDW, POC 12.1 %   Platelet Count, POC 304 142 - 424 K/uL   MPV 6.3 0 - 99.8 fL  POCT urinalysis dipstick  Result Value Ref Range   Color, UA orange (A) yellow   Clarity, UA clear clear   Glucose, UA negative negative   Bilirubin, UA small (A) negative   Ketones, POC UA negative negative   Spec Grav, UA >=1.030    Blood, UA negative negative   pH, UA 5.0    Protein Ur, POC trace (A) negative   Urobilinogen, UA 0.2    Nitrite, UA Negative Negative   Leukocytes, UA Negative Negative  POCT Microscopic Urinalysis ([facility])  Result Value Ref Range   WBC,UR,HPF,POC Few (A) None WBC/hpf  RBC,UR,HPF,POC None None RBC/hpf   Bacteria Few (A) None, Too numerous to count   Mucus Present (A) Absent   Epithelial Cells, UR Per Microscopy Moderate (A) None, Too numerous to count cells/hpf    EKG/XRAY:   Primary read interpreted by Dr. Everlene Farrier at [facility].   ASSESSMENT/PLAN: physical exam prior to proceeding with adoption in [place]. The labs requested by her organization were done. PPD  was applied. Her white count is significantly elevated. She has had this issue in the past. We'll check and see if she has been on steroids recently. Referral made to Dr.Ennever  to review her blood smear. TSH returned markedly elevated this was forwarded to her endocrinologist Dr. Cruzita Lederer. Forms will be completed once her labs return.I personally performed the services described in this documentation, which was scribed in my presence. The recorded information has been reviewed and is accurate.  Gross sideeffects, risk and benefits, and alternatives of medications d/w patient. Patient is aware that all medications have potential sideeffects and we are unable to predict every sideeffect or drug-drug interaction that may occur.  Arlyss Queen MD 12/15/2015 8:46 AM

## 2015-12-15 NOTE — Progress Notes (Signed)

## 2015-12-16 ENCOUNTER — Other Ambulatory Visit: Payer: Self-pay | Admitting: Emergency Medicine

## 2015-12-16 DIAGNOSIS — D72829 Elevated white blood cell count, unspecified: Secondary | ICD-10-CM

## 2015-12-17 ENCOUNTER — Other Ambulatory Visit: Payer: Self-pay

## 2015-12-17 DIAGNOSIS — D72829 Elevated white blood cell count, unspecified: Secondary | ICD-10-CM

## 2015-12-17 DIAGNOSIS — R7309 Other abnormal glucose: Secondary | ICD-10-CM

## 2015-12-17 LAB — COMPREHENSIVE METABOLIC PANEL
ALBUMIN: 4.4 g/dL (ref 3.6–5.1)
ALT: 39 U/L — AB (ref 6–29)
AST: 22 U/L (ref 10–35)
Alkaline Phosphatase: 51 U/L (ref 33–130)
BILIRUBIN TOTAL: 0.5 mg/dL (ref 0.2–1.2)
BUN: 14 mg/dL (ref 7–25)
CALCIUM: 9.5 mg/dL (ref 8.6–10.4)
CO2: 26 mmol/L (ref 20–31)
CREATININE: 0.75 mg/dL (ref 0.50–1.05)
Chloride: 101 mmol/L (ref 98–110)
Glucose, Bld: 136 mg/dL — ABNORMAL HIGH (ref 65–99)
Potassium: 3.8 mmol/L (ref 3.5–5.3)
Sodium: 139 mmol/L (ref 135–146)
Total Protein: 6.5 g/dL (ref 6.1–8.1)

## 2015-12-18 ENCOUNTER — Ambulatory Visit (INDEPENDENT_AMBULATORY_CARE_PROVIDER_SITE_OTHER): Payer: BLUE CROSS/BLUE SHIELD | Admitting: *Deleted

## 2015-12-18 ENCOUNTER — Telehealth: Payer: Self-pay | Admitting: *Deleted

## 2015-12-18 DIAGNOSIS — Z111 Encounter for screening for respiratory tuberculosis: Secondary | ICD-10-CM

## 2015-12-18 LAB — TB SKIN TEST
Induration: 0 mm
TB Skin Test: NEGATIVE

## 2015-12-18 NOTE — Telephone Encounter (Signed)
Called patient to let her know Dr Everlene Farrier will document on the forms the reason the CBC is elevated because she is taking Prednisone. Also, Pamala Hurry Investment banker, corporate) will complete the forms and she can pick them up tomorrow.

## 2015-12-19 ENCOUNTER — Encounter: Payer: Self-pay | Admitting: Internal Medicine

## 2015-12-19 ENCOUNTER — Ambulatory Visit (INDEPENDENT_AMBULATORY_CARE_PROVIDER_SITE_OTHER): Payer: BLUE CROSS/BLUE SHIELD | Admitting: Internal Medicine

## 2015-12-19 VITALS — BP 104/62 | HR 92 | Temp 98.2°F | Resp 12 | Wt 136.0 lb

## 2015-12-19 DIAGNOSIS — E039 Hypothyroidism, unspecified: Secondary | ICD-10-CM

## 2015-12-19 DIAGNOSIS — C73 Malignant neoplasm of thyroid gland: Secondary | ICD-10-CM | POA: Diagnosis not present

## 2015-12-19 MED ORDER — SYNTHROID 88 MCG PO TABS: 88.0000 ug | ORAL_TABLET | Freq: Every day | ORAL | Status: DC

## 2015-12-19 NOTE — Progress Notes (Signed)
Patient ID: Michelle [name], female   DOB: 07-07-1961, 55 y.o.   MRN: [id number]   HPI  Michelle [name] is a 55 y.o.-year-old female, referred by her PCP, Dr. Everlene Farrier, for management of papillary thyroid cancer and hypothyroidism. Previously seen by Dr Loanne Drilling in 2014. She also saw Dr. Chalmers Cater before.   Pt. has been dx with thyroid cancer in 2004 - reviewed Dr. Cordelia Pen note from 06/13/2013:  In 2004, pt had resection of thyroid isthmus and adjacent nodule. A small focus of papillary carcinoma was found. She has been followed with serial ultrasounds since then.   Reviewed pathology (03/20/2003):  THYROID, ISTHMUS AND NODULE: PAPILLARY THYROID CARCINOMA, 0.5 CM IN GREATEST DIMENSION, pT1, pNX, pMX.  1. Maximum tumor size (cm): 0.5 cm 2. Tumor location: Right 3. Multicentricity: Not identified 4. Histology: Cystic papillary carcinoma 5. Margins: Free of tumor 6. Capsular invasion: Not identified 7. Extrathyroid extension: Not identified 8. Vascular/Lymphatic invasion: Not identified 9. Lymph nodes: # examined 0; # positive N/A 10. TNM code: pT1, pNX, pMX 11. Non-neoplastic thyroid: Unremarkable 12. Comments: There is a cyst with fibrosis and inflammation in the wall. There is a 0.5 cm nodule within the cyst with features of papillary thyroid carcinoma including nuclear enlargement, nuclear grooves and occasional pseudo-nuclear inclusions. This nodule is within the inflamed and fibrotic cyst and no involvement of the margins by tumor is identified. (JDP:caf 03/23/03)  04/03/2013: Neck ultrasound:  Right thyroid lobe:3.6 x 1.1 x 0.7 cm, mildly heterogeneous in echotexture.  Left thyroid lobe:3.7 x 1.0 x 0.9 cm, mildly heterogeneous in echotexture. A solid hypervascular nodule in the left mid pole measures 4 mm.  Isthmus: Measures 1 mm. The patient reports partial resection of the thyroid, possibly the isthmus.  Lymphadenopathy: None. IMPRESSION: Tiny hypervascular left  thyroid nodule.  11/21/2015: Neck ultrasound:  Right thyroid lobe: 3.8 x 0.8 x 1.1 cm. No nodules visualized.  Left thyroid lobe: 3.6 x 0.7 x 1.0 cm. 5 mm hypervascular nodule in the left mid lobe is not significantly changed.  Isthmus Thickness: 1 mm. No nodules visualized.  Lymphadenopathy: Sub cm short axis diameter lymph nodes are demonstrated. IMPRESSION: Solitary nodule measuring 5 mm in the left lobe is not significantly changed.   Pt denies feeling nodules in neck, + some hoarseness and cough, dysphagia/odynophagia (but feels when she swallows), SOB with lying down.  For her hypothyroidism, she is on Levothyroxine 100 mcg (increased from 75 mcg daily), taken: - fasting - with water - separated by >30 min from b'fast  - no calcium, iron, MVI - + PPIs later in the day, prn   I reviewed pt's thyroid tests: Lab Results  Component Value Date   TSH 0.094* 12/15/2015   TSH 1.498 07/31/2015   TSH 0.691 11/13/2014   TSH 0.475 02/19/2014   TSH 0.032* 06/27/2013   TSH 0.05* 06/13/2013   TSH 0.014* 03/28/2013   TSH 2.228 09/27/2012   TSH 2.552 05/24/2012   TSH 4.579* 03/01/2012   FREET4 1.13 11/13/2014   FREET4 1.47 02/19/2014   FREET4 1.34 06/27/2013   FREET4 0.90 09/27/2012   FREET4 0.76* 05/24/2012   FREET4 0.90 03/01/2012    tUMOR MARKER: Component     Latest Ref Rng 09/27/2012 03/28/2013 02/19/2014 11/13/2014  Thyroperoxidase Ab SerPl-aCnc     <9 IU/mL    <1  Thyroglobulin Ab     <2 IU/mL <20.0  <20.0 <1  Thyroglobulin     2.8 - 40.9 ng/mL 10.1 9.1  7.0  Component     Latest Ref Rng 07/31/2015  Thyroperoxidase Ab SerPl-aCnc     <9 IU/mL <1  Thyroglobulin Ab     <2 IU/mL <1  Thyroglobulin     2.8 - 40.9 ng/mL 4.5   Pt describes: - no fatigue - + weight gain and loss - No heat or cold intolerance - + both diarrhea and constipation - + dry skin - No hair loss  - No anxiety or depression  She has no FH of thyroid disorders. No FH of thyroid cancer.   No h/o radiation tx to head or neck.  ROS: Constitutional: See history of present illness Eyes: no blurry vision, no xerophthalmia ENT: + sore throat, no nodules palpated in throat, + dysphagia/no odynophagia, + hoarseness Cardiovascular: no CP/SOB/palpitations/leg swelling Respiratory: no cough/SOB Gastrointestinal: + All: N/V/D/C/acid reflux Musculoskeletal: + Both: muscle/joint aches Skin: no rashes, + itching Neurological: no tremors/numbness/tingling/dizziness, + headache Psychiatric: no depression/anxiety  Past Medical History  Diagnosis Date  . Hypercholesteremia   . Reflux   . Gastroparesis   . VAIN I (vaginal intraepithelial neoplasia grade I) 09/2013, 11/2014     with positive high-risk HPV screen   . GERD (gastroesophageal reflux disease)   . Hypertension   . Osteopenia 06/2013    T score -1.2 FRAX 8.6%/0.6%  . Hypothyroidism   . Thyroid cancer ([place])     papillary  . PONV (postoperative nausea and vomiting)   . Osteoarthritis   . Asthma   . Gastroparesis     history of  . Headache(784.0)     migraines  . Seizures ([place])     1 seizure secondary to wellbutrin-none since  . Allergy   . Anaphylaxis   . Depression   . Overdose, drug 10/30/2011   Past Surgical History  Procedure Laterality Date  . Cosmetic surgery      buttock liposuction  . Thyroid surgery      for thyroid cancer  . Nissen fundoplication  5638, 9373    REFLUX  . Hernia repair  2004, 2012  . Breast surgery      reduc tion mammoplasty  . Cholecystectomy    . Upper gastrointestinal endoscopy  08/26/2004  . Varicose vein surgery  03/20/2003    right leg  . Colposcopy  02/2012    VAIN I  . Vaginal hysterectomy  2007    irregular bleeding   Social History   Social History  . Marital Status: Married    Spouse Name: [name]  . Number of Children: 1  . Years of Education: College   Occupational History  . phlebotomist    Social History Main Topics  . Smoking status: Never Smoker   .  Smokeless tobacco: Never Used  . Alcohol Use: No  . Drug Use: No  . Sexual Activity:    Partners: Male    Birth Control/ Protection: Surgical   Social History Narrative   Exercise: Yes.   Lives with her husband. Marital discord.   Receiving therapy with Vivia Budge.   Son lives locally.   Current Outpatient Prescriptions on File Prior to Visit  Medication Sig Dispense Refill  . EPINEPHrine 0.3 mg/0.3 mL IJ SOAJ injection Inject 0.3 mLs (0.3 mg total) into the muscle once. 2 Device 2  . estradiol (VIVELLE-DOT) 0.075 MG/24HR Place 1 patch onto the skin 2 (two) times a week. 8 patch 12  . ezetimibe (ZETIA) 10 MG tablet Take 1 tablet (10 mg total) by mouth daily. 90 tablet 2  .  lansoprazole (PREVACID SOLUTAB) 30 MG disintegrating tablet Take 1 tablet (30 mg total) by mouth 2 (two) times daily. Take 30- 60 min before your first and last meals of the day 180 tablet 2  . metoprolol succinate (TOPROL-XL) 25 MG 24 hr tablet TAKE ONE TAB BY MOUTH EVERY MORN, IF BLOOD PRESSURE STILL ELEVATED TAKE 1 TAB IN AFTERNOON. 180 tablet 2  . PRESCRIPTION MEDICATION Place 1 drop into both eyes every morning. Reported on 12/08/2015    . rosuvastatin (CRESTOR) 20 MG tablet Take 1 tablet (20 mg total) by mouth daily. 90 tablet 2  . VENTOLIN HFA 108 (90 BASE) MCG/ACT inhaler INHALE TWO PUFFS BY MOUTH EVERY FOUR HOURS AS NEEDED FOR WHEEZING, SHORTNESS OF BREATH OR COUGH 18 Inhaler 1  . vitamin B-12 (CYANOCOBALAMIN) 1000 MCG tablet Take 1,000 mcg by mouth daily.       Current Facility-Administered Medications on File Prior to Visit  Medication Dose Route Frequency Provider Last Rate Last Dose  . gadopentetate dimeglumine (MAGNEVIST) injection 15 mL  15 mL Intravenous Once PRN Penni Bombard, MD       Allergies  Allergen Reactions  . Clarithromycin Anaphylaxis  . Lunesta [Eszopiclone] Other (See Comments)    Causes sleep walking events  . Nutritional Supplements Anaphylaxis  . Pneumococcal Vaccines  Anaphylaxis  . Sulfamethoxazole-Trimethoprim Anaphylaxis  . Tetanus Toxoids Swelling  . Wellbutrin [Bupropion Hcl] Other (See Comments)    seizure  . Zolpidem     Causes sleep walking events  . Bactrim Hives and Other (See Comments)    Severe headache  . Clarithromycin Itching and Other (See Comments)    Severe headache  . Doxycycline Other (See Comments)    Severe GI upset  . Hydone [Chlorthalidone] Other (See Comments)    Vasculitis   . Ranitidine Other (See Comments)    Acts like a diuretic   . Reglan [Metoclopramide] Other (See Comments)    Tremors and ticks, possible permanence of effects  . Sulfa Antibiotics Hives and Other (See Comments)    Severe headache  . Tetracyclines & Related Other (See Comments)    Severe GI upset   Family History  Problem Relation Age of Onset  . Heart disease Mother   . Hypertension Mother   . Heart disease Father   . Hypertension Father   . Emphysema Father 15    was a smoker  . Heart disease Sister   . Hypertension Sister   . Diabetes Paternal Grandmother   . Multiple sclerosis Other   . Cancer Maternal Aunt     leukemia  . Cancer Maternal Grandmother   . Heart disease Brother   . Leukemia Maternal Grandfather     PE: BP 104/62 mmHg  Pulse 92  Temp(Src) 98.2 F (36.8 C) (Oral)  Resp 12  Wt 136 lb (61.689 kg)  SpO2 95%  LMP 05/23/2006 Wt Readings from Last 3 Encounters:  12/19/15 136 lb (61.689 kg)  12/15/15 136 lb (61.689 kg)  10/14/15 150 lb (68.04 kg)   Constitutional: overweight, in NAD Eyes: PERRLA, EOMI, no exophthalmos ENT: moist mucous membranes, no thyromegaly, no cervical lymphadenopathy Cardiovascular: RRR, No MRG Respiratory: CTA B Gastrointestinal: abdomen soft, NT, ND, BS+ Musculoskeletal: no deformities, strength intact in all 4 Skin: moist, warm, no rashes Neurological: no tremor with outstretched hands, DTR normal in all 4  ASSESSMENT: 1. Subcentimeter Papillary Thyroid cancer - see HPI  2.  Hypothyroidism  PLAN:  1. Thyroid cancer - papillary - I had a long  discussion with the patient about her thyroid cancer history. This was discovered incidentally after surgery to extract a right sided thyroid nodule that extended in the isthmus. Interestingly, she only had the mass removed, while the rest of the right thyroid lobe, isthmus, and the whole left thyroid was left in place. She had some complications after the surgery in terms of hoarseness and feeling that her voice was never the same. She was very disappointed that the thyroid was not taken out entirely at the time the cancer was found. She has been followed by different endocrinologist in the past and also by Dr. Everlene Farrier. She had a thyroid ultrasound in 2014 that showed an apparently new subcentimeter thyroid nodule, of 4 mm, in her left lobe. The ultrasound was repeated in 2016 and showed that the nodule was stable in size, at 5 mm. The 2016 ultrasound  images were reviewed with the patient >>the nodule is small, without microcalcifications, more wide than tall, isoechoic, but is not very well delimited  from the surrounding tissues and it was also felt that it is hypervascular (I cannot see it is clearly on the ultrasound). Patient is very nervous about this nodule and is worried that her cancer might have come back. She refuses a biopsy to check on this nodule, but would want thyroidectomy. She is preparing to adopt a girl from [place] in 08/2016 and would like tohave her thyroidectomy before then. I discussed with her that it is beneficial to know whether her nodule is cancer or not before the surgery, so that the surgeon can plan the surgery accordingly. However, since she is opposed a new FNA (had a bad experience with her first one), I will refer her to surgery to see if she can have the thyroidectomy without invasive investigation  - we also reviewed together the original pathology >> 0.5 cm focus of PTC, noninvasive >> stage 1TNM. I  explained that the new studies showed that this can be followed conservatively, without thyroidectomy and proceed with the surgery and maybe radioactive iodine only if metastases are detected. She is, however, adamant to have thyroidectomy now and sort out her health before adopting her baby.  -  she is aware about increased risk for scar tissue and complications with the second surgery. -  I will see the patient back 1-1.5 months after the surgery  2. Patient with hypothyroidism, on levothyroxine therapy.  reviewed recent labs >> she is over replaced with Synthroid  >> will reduce the dose to 88 g daily. She does not appear to have thyroid nodules, enlarged cervical lymph nodes, or neck compression symptoms. - We discussed about correct intake of levothyroxine, fasting, with water, separated by at least 30 minutes from breakfast, and separated by more than 4 hours from calcium, iron, multivitamins, acid reflux medications (PPIs).She is taking it correctly.  - will check thyroid tesafter the surgery, and I advised her that we may need to change the Synthroid dose at that time   - time spent with the patient: 1 hour, of which >50% was spent in obtaining information about her symptoms, reviewing her previous labs, evaluations, and treatments, counseling her about her conditions (please see the discussed topics above), and developing a plan to further treat them; she had a number of questions which I addressed.

## 2015-12-19 NOTE — Patient Instructions (Signed)
Please come back in 2.5 months.  I will refer you to Dr. Harlow Asa.  Please decrease the Synthroid dose to 88 mcg daily. For now, alternate 100 with 75 mcg every other day, but do not take a dose tomorrow.  Take the thyroid hormone every day, with water, at least 30 minutes before breakfast, separated by at least 4 hours from: - acid reflux medications - calcium - iron - multivitamins

## 2015-12-23 ENCOUNTER — Ambulatory Visit: Payer: BLUE CROSS/BLUE SHIELD | Admitting: Diagnostic Neuroimaging

## 2015-12-30 ENCOUNTER — Ambulatory Visit: Payer: BLUE CROSS/BLUE SHIELD | Admitting: Family Medicine

## 2016-01-01 ENCOUNTER — Other Ambulatory Visit: Payer: Self-pay

## 2016-01-01 MED ORDER — ESTRADIOL 0.075 MG/24HR TD PTTW: 1.0000 | MEDICATED_PATCH | TRANSDERMAL | Status: DC

## 2016-01-02 ENCOUNTER — Other Ambulatory Visit: Payer: Self-pay | Admitting: Gynecology

## 2016-01-02 NOTE — Telephone Encounter (Signed)
Annual on 02/12/16

## 2016-01-04 ENCOUNTER — Telehealth: Payer: Self-pay

## 2016-01-04 ENCOUNTER — Other Ambulatory Visit (INDEPENDENT_AMBULATORY_CARE_PROVIDER_SITE_OTHER): Payer: BLUE CROSS/BLUE SHIELD | Admitting: Family Medicine

## 2016-01-04 DIAGNOSIS — D72829 Elevated white blood cell count, unspecified: Secondary | ICD-10-CM

## 2016-01-04 LAB — CBC WITH DIFFERENTIAL/PLATELET
BASOS PCT: 1 % (ref 0–1)
Basophils Absolute: 0.1 10*3/uL (ref 0.0–0.1)
EOS ABS: 0.5 10*3/uL (ref 0.0–0.7)
EOS PCT: 7 % — AB (ref 0–5)
HCT: 42 % (ref 36.0–46.0)
Hemoglobin: 14.4 g/dL (ref 12.0–15.0)
Lymphocytes Relative: 45 % (ref 12–46)
Lymphs Abs: 3.5 10*3/uL (ref 0.7–4.0)
MCH: 31 pg (ref 26.0–34.0)
MCHC: 34.3 g/dL (ref 30.0–36.0)
MCV: 90.5 fL (ref 78.0–100.0)
MONO ABS: 0.6 10*3/uL (ref 0.1–1.0)
MPV: 9 fL (ref 8.6–12.4)
Monocytes Relative: 8 % (ref 3–12)
Neutro Abs: 3 10*3/uL (ref 1.7–7.7)
Neutrophils Relative %: 39 % — ABNORMAL LOW (ref 43–77)
PLATELETS: 250 10*3/uL (ref 150–400)
RBC: 4.64 MIL/uL (ref 3.87–5.11)
RDW: 13.1 % (ref 11.5–15.5)
WBC: 7.7 10*3/uL (ref 4.0–10.5)

## 2016-01-04 NOTE — Progress Notes (Signed)
Patient here today for lab draw only. 

## 2016-01-04 NOTE — Telephone Encounter (Signed)
Dr. Everlene Farrier    Patient dropped off information to be completed with the recent CPE  Papers are in your mail box in the MD Lounge   [phone] (M)

## 2016-01-07 ENCOUNTER — Ambulatory Visit: Payer: BLUE CROSS/BLUE SHIELD | Admitting: Family Medicine

## 2016-01-07 NOTE — Telephone Encounter (Signed)
I'll check out the form and see if it's something that I can do!

## 2016-01-07 NOTE — Telephone Encounter (Signed)
Let the patient know that Dr Everlene Farrier is out of town through 2/20.  She is really hoping this letter can be written sooner as it is holding up her paperwork for [place] for an adoption.  Chelle is it is a letter discussing her past medical history - the patient stated you know her are you comfortable writing this letter?  The form is in Dr Perfecto Kingdom box.

## 2016-01-08 NOTE — Telephone Encounter (Signed)
I am happy to complete the forms for [facility].  I can write a letter with the information on the instructions, but the instructions are for completing "this physical exam form" which is NOT attached.  I spoke with the patient, and found the form completed by Dr. Everlene Farrier and scanned into her record.  We agree that I will write a letter to serve as an addendum in clarification of the original form, specifically addressing her surgical history. Letter will be placed in an envelope for the patient to pick up.

## 2016-01-09 ENCOUNTER — Other Ambulatory Visit: Payer: Self-pay | Admitting: Emergency Medicine

## 2016-01-10 NOTE — Telephone Encounter (Signed)
Pt had CPE 1/22. OK to RF this? Allergies not discussed.

## 2016-01-13 ENCOUNTER — Ambulatory Visit: Payer: BLUE CROSS/BLUE SHIELD

## 2016-01-13 ENCOUNTER — Encounter: Payer: BLUE CROSS/BLUE SHIELD | Admitting: Hematology & Oncology

## 2016-01-13 ENCOUNTER — Other Ambulatory Visit: Payer: BLUE CROSS/BLUE SHIELD

## 2016-01-13 ENCOUNTER — Ambulatory Visit (INDEPENDENT_AMBULATORY_CARE_PROVIDER_SITE_OTHER): Payer: BLUE CROSS/BLUE SHIELD | Admitting: Physician Assistant

## 2016-01-13 VITALS — BP 128/94 | HR 92 | Temp 98.2°F | Resp 18 | Ht 64.0 in | Wt 136.0 lb

## 2016-01-13 DIAGNOSIS — Z7681 Expectant parent(s) prebirth pediatrician visit: Secondary | ICD-10-CM

## 2016-01-13 DIAGNOSIS — Z0282 Encounter for adoption services: Secondary | ICD-10-CM | POA: Diagnosis not present

## 2016-01-15 NOTE — Progress Notes (Signed)
Presents needing a letter to send as part of her application to adopt a child from [place]. She has had the physical performed, and I actually wrote a letter on her request already, but she did not realize the detail to which the information requested was needed. The information much match exactly the information already submitted on the notarized physical form.  BP 128/94 mmHg  Pulse 92  Temp(Src) 98.2 F (36.8 C) (Oral)  Resp 18  Ht 5\' 4"  (1.626 m)  Wt 136 lb (61.689 kg)  BMI 23.33 kg/m2  SpO2 98%  LMP 05/23/2006 [id number], A&O x 3. Normal behavior and speech. Appropriate mood and affect. Normal respiratory effort. Skin warm and dry.  We reviewed the documents, the instructions and her health history together and completed the letter to her satisfaction. A similar letter was provider for her husband.

## 2016-02-12 ENCOUNTER — Ambulatory Visit (INDEPENDENT_AMBULATORY_CARE_PROVIDER_SITE_OTHER): Payer: BLUE CROSS/BLUE SHIELD | Admitting: Gynecology

## 2016-02-12 ENCOUNTER — Encounter: Payer: Self-pay | Admitting: Gynecology

## 2016-02-12 VITALS — BP 120/74 | Ht 64.0 in

## 2016-02-12 DIAGNOSIS — N952 Postmenopausal atrophic vaginitis: Secondary | ICD-10-CM

## 2016-02-12 DIAGNOSIS — Z79890 Hormone replacement therapy (postmenopausal): Secondary | ICD-10-CM

## 2016-02-12 DIAGNOSIS — N893 Dysplasia of vagina, unspecified: Secondary | ICD-10-CM | POA: Diagnosis not present

## 2016-02-12 DIAGNOSIS — M8580 Other specified disorders of bone density and structure, unspecified site: Secondary | ICD-10-CM | POA: Diagnosis not present

## 2016-02-12 DIAGNOSIS — Z01419 Encounter for gynecological examination (general) (routine) without abnormal findings: Secondary | ICD-10-CM

## 2016-02-12 NOTE — Progress Notes (Signed)
    BINTU TUMMINIA 1961-06-23 [id number]        55 y.o.  G1P1  for annual exam. Several issues noted below  Past medical history,surgical history, problem list, medications, allergies, family history and social history were all reviewed and documented as reviewed in the EPIC chart.  ROS:  Performed with pertinent positives and negatives included in the history, assessment and plan.   Additional significant findings :  none   Exam: Caryn Bee assistant Filed Vitals:   02/12/16 1420  BP: 120/74  Height: 5\' 4"  (1.626 m)   General appearance:  Normal affect, orientation and appearance. Skin: Grossly normal HEENT: Without gross lesions.  No cervical or supraclavicular adenopathy. Thyroid normal.  Lungs:  Clear without wheezing, rales or rhonchi Cardiac: RR, without RMG Abdominal:  Soft, nontender, without masses, guarding, rebound, organomegaly or hernia Breasts:  Examined lying and sitting without masses, retractions, discharge or axillary adenopathy.  Well-healed bilateral reduction scars Pelvic:  Ext/BUS/vagina with atrophic changes. Pap smear/HPV of vaginal cuff  Adnexa without masses or tenderness    Anus and perineum normal   Rectovaginal normal sphincter tone without palpated masses or tenderness.    Assessment/Plan:  55 y.o. G1P1 female for annual exam.   1. Postmenopausal/atrophic genital changes. Status post [facility] 2007 for irregular bleeding. On minivelle 0.075 patches. Doing well. Again reviewed issues of HRT and risks to include stroke heart attack DVT and possible breast cancer. Lowest dose per shortest period of time recommendations reviewed. Patient does want to try to wean down. Just filled a 0.075 prescription for 3 months. Will call near the end of this prescription and will prescribe 0.05 mg patches we'll see how she does with that. If she tolerates it then will keep her on this for this year. 2. VAIN 1.  History of VAIN 1 2011 following her vaginal hysterectomy for  benign indications with no history of abnormal Pap smears previously. Colposcopy with biopsy showed VAIN 1. Subsequent colposcopy with biopsy 2013 showed VAIN 1. Pap smear 2014 showed VAIN 1 with positive high-risk HPV negative subtype 16/18. Colposcopy was normal and no biopsies done. Pap smear 2016 showed LGSIL with positive high-risk HPV. Colposcopy biopsies were benign. Pap smear/HPV today. 3. Osteopenia. DEXA 06/2013 T score -1.2 FRAX 8.6%/0.6%. Plan repeat DEXA in several years. 4. Mammography 04/2015. Continue with annual mammography when due. SBE monthly review. 5. Colonoscopy 2016. Repeat at their recommended interval. 6. Health maintenance. No routine lab work done as patient reports is done elsewhere. Follow up one year, sooner as needed.   Anastasio Auerbach MD, 2:42 PM 02/12/2016

## 2016-02-12 NOTE — Addendum Note (Signed)
Addended by: Nelva Nay on: 02/12/2016 02:51 PM   Modules accepted: Orders

## 2016-02-12 NOTE — Patient Instructions (Signed)

## 2016-02-13 LAB — PAP IG AND HPV HIGH-RISK: HPV DNA HIGH RISK: NOT DETECTED

## 2016-03-03 ENCOUNTER — Ambulatory Visit: Payer: BLUE CROSS/BLUE SHIELD | Admitting: Internal Medicine

## 2016-03-03 DIAGNOSIS — Z0289 Encounter for other administrative examinations: Secondary | ICD-10-CM

## 2016-03-23 NOTE — Progress Notes (Signed)
This encounter was created in error - please disregard.

## 2016-04-09 ENCOUNTER — Telehealth: Payer: Self-pay

## 2016-04-09 MED ORDER — ROSUVASTATIN CALCIUM 20 MG PO TABS: 20.0000 mg | ORAL_TABLET | Freq: Every day | ORAL | Status: DC

## 2016-04-09 MED ORDER — EZETIMIBE 10 MG PO TABS: 10.0000 mg | ORAL_TABLET | Freq: Every day | ORAL | Status: DC

## 2016-04-09 NOTE — Telephone Encounter (Signed)
Rx sent 

## 2016-04-09 NOTE — Telephone Encounter (Signed)
Pt would like a refill on her Zetia 10 mg and Crestor 20 mg. She would like them sent to [facility]. I realize these are discontinued scripts, she says we do them for her. Please advise at [phone]

## 2016-04-09 NOTE — Telephone Encounter (Signed)
Advised Rx sent in.

## 2016-04-09 NOTE — Telephone Encounter (Signed)
Yes it is fine to refill these 2 medications.

## 2016-04-09 NOTE — Telephone Encounter (Signed)
Ok to refill 

## 2016-05-13 ENCOUNTER — Telehealth: Payer: Self-pay

## 2016-05-13 NOTE — Telephone Encounter (Signed)
Left message for pt to call back  °

## 2016-05-13 NOTE — Telephone Encounter (Signed)
I don't understand this message, but I don't even see zoloft on pt's med list, or that we have ever Rxd it for pt. Pt is overdue for a depression follow up visit anyway, so I would advise that pt should come in to be seen to discuss medication changes. Will have to call pt when phones have been repaired.

## 2016-05-13 NOTE — Telephone Encounter (Signed)
Patient is calling to request zoloft generic but wants to increase to 30 pills so she can have a 90 days supply. Patient phone: [phone] Call into [facility] mail order

## 2016-05-13 NOTE — Telephone Encounter (Signed)
Spoke with Michelle [name], she would like 30 pills instead of 20 of Zofran. She wants a 90 day supply, so #90.

## 2016-05-14 MED ORDER — ONDANSETRON 4 MG PO TBDP: 4.0000 mg | ORAL_TABLET | Freq: Three times a day (TID) | ORAL | Status: DC | PRN

## 2016-05-14 NOTE — Telephone Encounter (Signed)
Notified pt that RFs were sent. Also, discussed new PCP. She will be coming to see Dr Tamala Julian, since she was listed as new PCP on Dr Perfecto Kingdom retirement letter. Even though she has not been in to see Dr Tamala Julian yet, she has had a consult with her. I do not see the documentation of that, but will ask Dr Tamala Julian the next time a message needs to be sent to pt's provider. Dr Everlene Farrier, [place].

## 2016-05-14 NOTE — Telephone Encounter (Signed)
It is okay to give her this. I'll thought she had transitioned over to Dr. Tamala Julian as her PCP now. Please ask her when you call. She can have #90 tablets a 3 month supply. And one refill

## 2016-06-18 ENCOUNTER — Other Ambulatory Visit: Payer: Self-pay | Admitting: Internal Medicine

## 2016-06-18 ENCOUNTER — Other Ambulatory Visit: Payer: Self-pay | Admitting: Emergency Medicine

## 2016-06-18 NOTE — Telephone Encounter (Signed)
Needs thyroid labs before further refills. 

## 2016-06-24 ENCOUNTER — Ambulatory Visit (INDEPENDENT_AMBULATORY_CARE_PROVIDER_SITE_OTHER): Payer: BLUE CROSS/BLUE SHIELD | Admitting: Physician Assistant

## 2016-06-24 VITALS — BP 122/82 | HR 81 | Temp 98.1°F | Resp 16 | Ht 64.0 in | Wt 140.0 lb

## 2016-06-24 DIAGNOSIS — K219 Gastro-esophageal reflux disease without esophagitis: Secondary | ICD-10-CM | POA: Diagnosis not present

## 2016-06-24 DIAGNOSIS — E039 Hypothyroidism, unspecified: Secondary | ICD-10-CM

## 2016-06-24 DIAGNOSIS — E7800 Pure hypercholesterolemia, unspecified: Secondary | ICD-10-CM

## 2016-06-24 DIAGNOSIS — G4700 Insomnia, unspecified: Secondary | ICD-10-CM | POA: Diagnosis not present

## 2016-06-24 DIAGNOSIS — I10 Essential (primary) hypertension: Secondary | ICD-10-CM

## 2016-06-24 LAB — COMPREHENSIVE METABOLIC PANEL
ALBUMIN: 4.6 g/dL (ref 3.6–5.1)
ALT: 45 U/L — ABNORMAL HIGH (ref 6–29)
AST: 39 U/L — ABNORMAL HIGH (ref 10–35)
Alkaline Phosphatase: 57 U/L (ref 33–130)
BUN: 9 mg/dL (ref 7–25)
CHLORIDE: 103 mmol/L (ref 98–110)
CO2: 27 mmol/L (ref 20–31)
Calcium: 9.8 mg/dL (ref 8.6–10.4)
Creat: 0.82 mg/dL (ref 0.50–1.05)
Glucose, Bld: 86 mg/dL (ref 65–99)
POTASSIUM: 4.4 mmol/L (ref 3.5–5.3)
Sodium: 140 mmol/L (ref 135–146)
TOTAL PROTEIN: 7.2 g/dL (ref 6.1–8.1)
Total Bilirubin: 0.9 mg/dL (ref 0.2–1.2)

## 2016-06-24 LAB — CBC WITH DIFFERENTIAL/PLATELET
BASOS ABS: 116 {cells}/uL (ref 0–200)
Basophils Relative: 1 %
EOS ABS: 464 {cells}/uL (ref 15–500)
Eosinophils Relative: 4 %
HCT: 42.9 % (ref 35.0–45.0)
HEMOGLOBIN: 15.1 g/dL (ref 11.7–15.5)
LYMPHS ABS: 4292 {cells}/uL — AB (ref 850–3900)
Lymphocytes Relative: 37 %
MCH: 32 pg (ref 27.0–33.0)
MCHC: 35.2 g/dL (ref 32.0–36.0)
MCV: 90.9 fL (ref 80.0–100.0)
MPV: 9.1 fL (ref 7.5–12.5)
Monocytes Absolute: 812 cells/uL (ref 200–950)
Monocytes Relative: 7 %
NEUTROS ABS: 5916 {cells}/uL (ref 1500–7800)
Neutrophils Relative %: 51 %
Platelets: 254 10*3/uL (ref 140–400)
RBC: 4.72 MIL/uL (ref 3.80–5.10)
RDW: 13.2 % (ref 11.0–15.0)
WBC: 11.6 10*3/uL — ABNORMAL HIGH (ref 3.8–10.8)

## 2016-06-24 LAB — LIPID PANEL
CHOL/HDL RATIO: 3.8 ratio (ref <=5.0)
CHOLESTEROL: 207 mg/dL — AB (ref 125–200)
HDL: 55 mg/dL (ref >=46)
LDL Cholesterol: 85 mg/dL (ref <130)
TRIGLYCERIDES: 335 mg/dL — AB (ref <150)
VLDL: 67 mg/dL — AB (ref <30)

## 2016-06-24 LAB — TSH: TSH: 0.5 mIU/L

## 2016-06-24 MED ORDER — TRAZODONE HCL 50 MG PO TABS: 50.0000 mg | ORAL_TABLET | Freq: Every evening | ORAL | 3 refills | Status: DC | PRN

## 2016-06-24 NOTE — Patient Instructions (Signed)
     IF you received an x-ray today, you will receive an invoice from [facility]. Please contact [facility] at [phone] with questions or concerns regarding your invoice.   IF you received labwork today, you will receive an invoice from [facility]. Please contact [facility] at [phone] with questions or concerns regarding your invoice.   Our billing staff will not be able to assist you with questions regarding bills from these companies.  You will be contacted with the lab results as soon as they are available. The fastest way to get your results is to activate your My Chart account. Instructions are located on the last page of this paperwork. If you have not heard from us regarding the results in 2 weeks, please contact this office.      

## 2016-06-24 NOTE — Progress Notes (Signed)
Patient ID: Michelle [name], female    DOB: 1961-07-05, 55 y.o.   MRN: [id number]  PCP: Jenny Reichmann, MD  Subjective:   Chief Complaint  Patient presents with  . Labs Only  . Medical form    Adoption paperwork    HPI Presents for fasting labs to address hypothyroidism, HTN, hyerlipidemia. She also needs updated letters for herself and her husband as they are trying to adopt a daughter from [place]. The company told them the forms have changed, and they need to be recompleted.  Her medical problems have been stable and controlled. She tolerates her medication well, has no adverse effects.    Review of Systems  Constitutional: Negative.   HENT: Negative for sore throat.   Eyes: Negative for visual disturbance.  Respiratory: Negative for cough, chest tightness, shortness of breath and wheezing.   Cardiovascular: Negative for chest pain and palpitations.  Gastrointestinal: Negative for abdominal pain, diarrhea, nausea and vomiting.  Genitourinary: Negative for dysuria, frequency, hematuria and urgency.  Musculoskeletal: Negative for arthralgias and myalgias.  Skin: Negative for rash.  Neurological: Negative for dizziness, weakness and headaches.  Psychiatric/Behavioral: Negative for decreased concentration. The patient is not nervous/anxious.        Patient Active Problem List   Diagnosis Date Noted  . Dizziness and giddiness 11/07/2015  . Gait difficulty 11/07/2015  . Abnormal brain MRI 11/07/2015  . Cough 04/02/2013  . Abnormal CXR 04/02/2013  . Leukocytosis 10/18/2012  . History of anaphylaxis 03/01/2012  . Depression 10/30/2011  . H/O suicide attempt 10/30/2011  . Hx of thyroid cancer   . Hypothyroidism   . Hypertension   . Asthma   . Gastroparesis   . Hypercholesteremia   . VAIN (vaginal intraepithelial neoplasia)   . GERD (gastroesophageal reflux disease) 07/16/2011     Prior to Admission medications   Medication Sig Start Date End Date Taking?  Authorizing Provider  cetirizine (ZYRTEC) 10 MG tablet TAKE ONE TABLET BY MOUTH AT BEDTIME 01/12/16  Yes Ezekiel Slocumb, PA-C  EPINEPHrine 0.3 mg/0.3 mL IJ SOAJ injection Inject 0.3 mLs (0.3 mg total) into the muscle once. 10/24/14  Yes Darlyne Russian, MD  ezetimibe (ZETIA) 10 MG tablet Take 1 tablet (10 mg total) by mouth daily. 04/09/16  Yes Darlyne Russian, MD  metoprolol succinate (TOPROL-XL) 25 MG 24 hr tablet TAKE ONE TAB BY MOUTH EVERY MORN, IF BLOOD PRESSURE STILL ELEVATED TAKE 1 TAB IN AFTERNOON. 08/01/15  Yes Darlyne Russian, MD  ondansetron (ZOFRAN ODT) 4 MG disintegrating tablet Take 1 tablet (4 mg total) by mouth every 8 (eight) hours as needed for nausea or vomiting. 05/14/16  Yes Darlyne Russian, MD  PRESCRIPTION MEDICATION Place 1 drop into both eyes every morning. Reported on 12/08/2015   Yes Historical Provider, MD  PREVACID SOLUTAB 30 MG disintegrating tablet FOR DIRECTIONS ON HOW TO   TAKE THIS MEDICINE, READ   THE ENCLOSED MEDICATION    INFORMATION FORM 06/19/16  Yes Darlyne Russian, MD  rosuvastatin (CRESTOR) 20 MG tablet Take 1 tablet (20 mg total) by mouth daily. 04/09/16  Yes Darlyne Russian, MD  SYNTHROID 88 MCG tablet TAKE 1 TABLET DAILY BEFORE BREAKFAST 06/18/16  Yes Philemon Kingdom, MD  VENTOLIN HFA 108 (90 BASE) MCG/ACT inhaler INHALE TWO PUFFS BY MOUTH EVERY FOUR HOURS AS NEEDED FOR WHEEZING, SHORTNESS OF BREATH OR COUGH 04/25/15  Yes Darlyne Russian, MD  vitamin B-12 (CYANOCOBALAMIN) 1000 MCG tablet Take 1,000 mcg by mouth  daily.     Yes Historical Provider, MD  VIVELLE-DOT 0.075 MG/24HR APPLY ONE PATCH EXTERNALLY TWICE WEEKLY 01/02/16  Yes Anastasio Auerbach, MD     Allergies  Allergen Reactions  . Clarithromycin Anaphylaxis  . Lunesta [Eszopiclone] Other (See Comments)    Causes sleep walking events  . Nutritional Supplements Anaphylaxis  . Pneumococcal Vaccines Anaphylaxis  . Sulfamethoxazole-Trimethoprim Anaphylaxis  . Tetanus Toxoids Swelling  . Wellbutrin [Bupropion Hcl] Other  (See Comments)    seizure  . Zolpidem     Causes sleep walking events  . Bactrim Hives and Other (See Comments)    Severe headache  . Clarithromycin Itching and Other (See Comments)    Severe headache  . Doxycycline Other (See Comments)    Severe GI upset  . Hydone [Chlorthalidone] Other (See Comments)    Vasculitis   . Ranitidine Other (See Comments)    Acts like a diuretic   . Reglan [Metoclopramide] Other (See Comments)    Tremors and ticks, possible permanence of effects  . Sulfa Antibiotics Hives and Other (See Comments)    Severe headache  . Tetracyclines & Related Other (See Comments)    Severe GI upset       Objective:  Physical Exam  Constitutional: She is oriented to person, place, and time. She appears well-developed and well-nourished. She is active and cooperative. No distress.  BP 122/82 (BP Location: Right Arm, Patient Position: Sitting, Cuff Size: Normal)   Pulse 81   Temp 98.1 F (36.7 C) (Oral)   Resp 16   Ht 5\' 4"  (1.626 m)   Wt 140 lb (63.5 kg)   LMP 05/23/2006   SpO2 98%   BMI 24.03 kg/m    Eyes: Conjunctivae are normal.  Pulmonary/Chest: Effort normal.  Neurological: She is alert and oriented to person, place, and time.  Psychiatric: She has a normal mood and affect. Her speech is normal and behavior is normal.           Assessment & Plan:   1. Essential hypertension Controlled. Stable. - CBC with Differential/Platelet - Comprehensive metabolic panel  2. Hypothyroidism, unspecified hypothyroidism type Await lab result. - TSH  3. Gastroesophageal reflux disease without esophagitis Controlled, stable.  4. Hypercholesteremia Await lab result. - Comprehensive metabolic panel - Lipid panel  5. Insomnia Controlled, PRN trazodone when increased stress prevents her from falling asleep. - traZODone (DESYREL) 50 MG tablet; Take 1-2 tablets (50-100 mg total) by mouth at bedtime as needed for sleep.  Dispense: 60 tablet; Refill: 3  New  letters written, forms recompleted.  Fara Chute, PA-C Physician Assistant-Certified [facility]

## 2016-06-25 ENCOUNTER — Encounter: Payer: Self-pay | Admitting: Physician Assistant

## 2016-07-18 ENCOUNTER — Ambulatory Visit (INDEPENDENT_AMBULATORY_CARE_PROVIDER_SITE_OTHER): Payer: BLUE CROSS/BLUE SHIELD | Admitting: Family Medicine

## 2016-07-18 VITALS — BP 138/98 | HR 102 | Temp 99.1°F | Resp 24 | Ht 64.0 in | Wt 137.0 lb

## 2016-07-18 DIAGNOSIS — J988 Other specified respiratory disorders: Secondary | ICD-10-CM

## 2016-07-18 DIAGNOSIS — J22 Unspecified acute lower respiratory infection: Secondary | ICD-10-CM

## 2016-07-18 DIAGNOSIS — J4521 Mild intermittent asthma with (acute) exacerbation: Secondary | ICD-10-CM

## 2016-07-18 MED ORDER — AZITHROMYCIN 250 MG PO TABS: ORAL_TABLET | ORAL | 0 refills | Status: DC

## 2016-07-18 MED ORDER — PREDNISONE 20 MG PO TABS: ORAL_TABLET | ORAL | 0 refills | Status: DC

## 2016-07-18 MED ORDER — HYDROCOD POLST-CPM POLST ER 10-8 MG/5ML PO SUER: 5.0000 mL | Freq: Two times a day (BID) | ORAL | 0 refills | Status: DC | PRN

## 2016-07-18 MED ORDER — ALBUTEROL SULFATE HFA 108 (90 BASE) MCG/ACT IN AERS: INHALATION_SPRAY | RESPIRATORY_TRACT | 1 refills | Status: DC

## 2016-07-18 NOTE — Progress Notes (Signed)
Subjective:  By signing my name below, I, Raven Small, attest that this documentation has been prepared under the direction and in the presence of Reginia Forts, MD.  Electronically Signed: Thea Alken, ED Scribe. 07/20/2016. 10:11 PM.   Patient ID: Michelle [name], female    DOB: 07-08-1961, 55 y.o.   MRN: [id number]  07/18/2016  Cough (x 2 weeks) and Medication Refill   HPI   Michelle [name] is a 55 y.o. female who presents to the [facility] complaining of cough for 2 weeks. Pt reports associated low grade fever of 99, fatigue, HA, wheeze, post nasal drip, congestion with yellow/tan mucous, scratchy throat, itchy ears. States symptoms were initially improving but worsened 4 days ago. She took nyquil last night. Pt reports sick contacts of her husband and his co-workers. Pt states her and her husband went to [facility] for his works a few weeks ago and once of his co workers had a cold. She denies sinus pressure, ear pain and SOB. Pt has multiple drug allergies but states she is able to take a zpack.    Pt is adopting a 5 years girl from [place] and is going to get her in October.     Review of Systems  Constitutional: Positive for chills, fatigue and fever. Negative for diaphoresis.  HENT: Positive for congestion, postnasal drip and sore throat. Negative for ear discharge, ear pain, rhinorrhea, sinus pressure and trouble swallowing.   Respiratory: Positive for cough and wheezing. Negative for shortness of breath.   Cardiovascular: Negative for chest pain, palpitations and leg swelling.  Gastrointestinal: Positive for nausea. Negative for abdominal pain, constipation, diarrhea and vomiting.    Past Medical History:  Diagnosis Date  . Allergy   . Anaphylaxis   . Asthma   . Depression   . Gastroparesis   . Gastroparesis    history of  . GERD (gastroesophageal reflux disease)   . Headache(784.0)    migraines  . Hypercholesteremia   . Hypothyroidism   .  Osteoarthritis   . Osteopenia 06/2013   T score -1.2 FRAX 8.6%/0.6%  . Overdose, drug 10/30/2011  . PONV (postoperative nausea and vomiting)   . Reflux   . Seizures ([place])    1 seizure secondary to wellbutrin-none since  . Thyroid cancer ([place])    papillary  . VAIN I (vaginal intraepithelial neoplasia grade I) 09/2013, 11/2014    with positive high-risk HPV screen    Past Surgical History:  Procedure Laterality Date  . BREAST SURGERY     reduc tion mammoplasty  . CHOLECYSTECTOMY  2004  . COLPOSCOPY  02/2012   VAIN I  . COSMETIC SURGERY     buttock liposuction  . HERNIA REPAIR  2012  . NISSEN FUNDOPLICATION  [id number], [id number]   REFLUX  . THYROID SURGERY  2007   for thyroid cancer  . UPPER GASTROINTESTINAL ENDOSCOPY  08/26/2004  . VAGINAL HYSTERECTOMY  2007   irregular bleeding  . VARICOSE VEIN SURGERY  03/20/2003   right leg   Allergies  Allergen Reactions  . Clarithromycin Anaphylaxis  . Lunesta [Eszopiclone] Other (See Comments)    Causes sleep walking events  . Nutritional Supplements Anaphylaxis  . Pneumococcal Vaccines Anaphylaxis  . Sulfamethoxazole-Trimethoprim Anaphylaxis  . Tetanus Toxoids Swelling  . Wellbutrin [Bupropion Hcl] Other (See Comments)    seizure  . Zolpidem     Causes sleep walking events  . Bactrim Hives and Other (See Comments)    Severe headache  .  Clarithromycin Itching and Other (See Comments)    Severe headache  . Doxycycline Other (See Comments)    Severe GI upset  . Hydone [Chlorthalidone] Other (See Comments)    Vasculitis   . Ranitidine Other (See Comments)    Acts like a diuretic   . Reglan [Metoclopramide] Other (See Comments)    Tremors and ticks, possible permanence of effects  . Sulfa Antibiotics Hives and Other (See Comments)    Severe headache  . Tetracyclines & Related Other (See Comments)    Severe GI upset    Social History   Social History  . Marital status: Married    Spouse name: [name]  . Number of children: 1  .  Years of education: College   Occupational History  . homemaker    Social History Main Topics  . Smoking status: Never Smoker  . Smokeless tobacco: Never Used  . Alcohol use No  . Drug use: No  . Sexual activity: Yes    Partners: Male    Birth control/ protection: Surgical     Comment: 1st intercourse 22 yo-1 partner-HYST   Other Topics Concern  . Not on file   Social History Narrative   Exercise: Yes.   Lives with her husband. Marital discord.   Receiving therapy with Vivia Budge.   Son lives locally.   Family History  Problem Relation Age of Onset  . Heart disease Mother   . Hypertension Mother   . Heart disease Father   . Hypertension Father   . Emphysema Father 34    was a smoker  . Heart disease Sister   . Hypertension Sister   . Diabetes Paternal Grandmother   . Multiple sclerosis Other   . Cancer Maternal Grandmother   . Heart disease Brother   . Leukemia Maternal Grandfather   . Cancer Maternal Aunt     leukemia      Objective:    BP (!) 138/98   Pulse (!) 102   Temp 99.1 F (37.3 C) (Oral)   Resp (!) 24   Ht 5\' 4"  (1.626 m)   Wt 137 lb (62.1 kg)   LMP 05/23/2006   SpO2 96%   BMI 23.52 kg/m  Physical Exam  Constitutional: She is oriented to person, place, and time. She appears well-developed and well-nourished. No distress.  HENT:  Head: Normocephalic and atraumatic.  Right Ear: Tympanic membrane, external ear and ear canal normal.  Left Ear: Tympanic membrane, external ear and ear canal normal.  Nose: Rhinorrhea present. Right sinus exhibits no maxillary sinus tenderness and no frontal sinus tenderness. Left sinus exhibits no maxillary sinus tenderness and no frontal sinus tenderness.  Mouth/Throat: Uvula is midline and mucous membranes are normal. Posterior oropharyngeal erythema present. No oropharyngeal exudate.  Eyes: Conjunctivae are normal. Pupils are equal, round, and reactive to light.  Neck: Normal range of motion. Neck supple.    Cardiovascular: Normal rate, regular rhythm and normal heart sounds.  Exam reveals no gallop and no friction rub.   No murmur heard. Pulmonary/Chest: Effort normal and breath sounds normal. She has no wheezes. She has no rales.  Lymphadenopathy:    She has no cervical adenopathy.  Neurological: She is alert and oriented to person, place, and time.  Skin: She is not diaphoretic.  Psychiatric: She has a normal mood and affect. Her behavior is normal.  Nursing note and vitals reviewed.   Assessment & Plan:   1. Lower respiratory infection   2. Asthma with acute  exacerbation, mild intermittent     No orders of the defined types were placed in this encounter.  Meds ordered this encounter  Medications  . predniSONE (DELTASONE) 20 MG tablet    Sig: Two tablets daily x 5 days then one tablet daily x 5 days    Dispense:  15 tablet    Refill:  0  . azithromycin (ZITHROMAX) 250 MG tablet    Sig: Two tablets daily x 1 day then one tablet daily x 4 days    Dispense:  6 tablet    Refill:  0  . chlorpheniramine-HYDROcodone (TUSSIONEX PENNKINETIC ER) 10-8 MG/5ML SUER    Sig: Take 5 mLs by mouth every 12 (twelve) hours as needed for cough.    Dispense:  140 mL    Refill:  0    GENERIC EQUIVALENT PLEASE  . albuterol (VENTOLIN HFA) 108 (90 Base) MCG/ACT inhaler    Sig: INHALE TWO PUFFS BY MOUTH EVERY FOUR HOURS AS NEEDED FOR WHEEZING, SHORTNESS OF BREATH OR COUGH    Dispense:  18 Inhaler    Refill:  1    PT NEEDS ASAP! PER PT. REQUEST.    No Follow-up on file.   I personally performed the services described in this documentation, which was scribed in my presence. The recorded information has been reviewed and considered.  Wania Longstreth Elayne Guerin, M.D. [facility] [address] [place], [place]  [address] [phone] phone [phone] fax

## 2016-07-18 NOTE — Patient Instructions (Addendum)
   IF you received an x-ray today, you will receive an invoice from [facility]. Please contact [facility] at [phone] with questions or concerns regarding your invoice.   IF you received labwork today, you will receive an invoice from [facility]. Please contact [facility] at [phone] with questions or concerns regarding your invoice.   Our billing staff will not be able to assist you with questions regarding bills from these companies.  You will be contacted with the lab results as soon as they are available. The fastest way to get your results is to activate your My Chart account. Instructions are located on the last page of this paperwork. If you have not heard from us regarding the results in 2 weeks, please contact this office.    Acute Bronchitis Bronchitis is inflammation of the airways that extend from the windpipe into the lungs (bronchi). The inflammation often causes mucus to develop. This leads to a cough, which is the most common symptom of bronchitis.  In acute bronchitis, the condition usually develops suddenly and goes away over time, usually in a couple weeks. Smoking, allergies, and asthma can make bronchitis worse. Repeated episodes of bronchitis may cause further lung problems.  CAUSES Acute bronchitis is most often caused by the same virus that causes a cold. The virus can spread from person to person (contagious) through coughing, sneezing, and touching contaminated objects. SIGNS AND SYMPTOMS   Cough.   Fever.   Coughing up mucus.   Body aches.   Chest congestion.   Chills.   Shortness of breath.   Sore throat.  DIAGNOSIS  Acute bronchitis is usually diagnosed through a physical exam. Your health care provider will also ask you questions about your medical history. Tests, such as chest X-rays, are sometimes done to rule out other conditions.  TREATMENT  Acute bronchitis usually goes away in a  couple weeks. Oftentimes, no medical treatment is necessary. Medicines are sometimes given for relief of fever or cough. Antibiotic medicines are usually not needed but may be prescribed in certain situations. In some cases, an inhaler may be recommended to help reduce shortness of breath and control the cough. A cool mist vaporizer may also be used to help thin bronchial secretions and make it easier to clear the chest.  HOME CARE INSTRUCTIONS  Get plenty of rest.   Drink enough fluids to keep your urine clear or pale yellow (unless you have a medical condition that requires fluid restriction). Increasing fluids may help thin your respiratory secretions (sputum) and reduce chest congestion, and it will prevent dehydration.   Take medicines only as directed by your health care provider.  If you were prescribed an antibiotic medicine, finish it all even if you start to feel better.  Avoid smoking and secondhand smoke. Exposure to cigarette smoke or irritating chemicals will make bronchitis worse. If you are a smoker, consider using nicotine gum or skin patches to help control withdrawal symptoms. Quitting smoking will help your lungs heal faster.   Reduce the chances of another bout of acute bronchitis by washing your hands frequently, avoiding people with cold symptoms, and trying not to touch your hands to your mouth, nose, or eyes.   Keep all follow-up visits as directed by your health care provider.  SEEK MEDICAL CARE IF: Your symptoms do not improve after 1 week of treatment.  SEEK IMMEDIATE MEDICAL CARE IF:  You develop an increased fever or chills.   You have chest pain.     You have severe shortness of breath.  You have bloody sputum.   You develop dehydration.  You faint or repeatedly feel like you are going to pass out.  You develop repeated vomiting.  You develop a severe headache. MAKE SURE YOU:   Understand these instructions.  Will watch your  condition.  Will get help right away if you are not doing well or get worse.   This information is not intended to replace advice given to you by your health care provider. Make sure you discuss any questions you have with your health care provider.   Document Released: 12/17/2004 Document Revised: 11/30/2014 Document Reviewed: 05/02/2013 Elsevier Interactive Patient Education 2016 [employer]  

## 2016-08-01 ENCOUNTER — Other Ambulatory Visit: Payer: Self-pay | Admitting: Gynecology

## 2016-08-11 ENCOUNTER — Other Ambulatory Visit: Payer: Self-pay | Admitting: Gynecology

## 2016-08-25 ENCOUNTER — Ambulatory Visit (INDEPENDENT_AMBULATORY_CARE_PROVIDER_SITE_OTHER): Payer: BLUE CROSS/BLUE SHIELD | Admitting: Physician Assistant

## 2016-08-25 ENCOUNTER — Ambulatory Visit (INDEPENDENT_AMBULATORY_CARE_PROVIDER_SITE_OTHER): Payer: BLUE CROSS/BLUE SHIELD

## 2016-08-25 ENCOUNTER — Encounter: Payer: Self-pay | Admitting: Physician Assistant

## 2016-08-25 VITALS — BP 132/78 | HR 97 | Resp 16 | Ht 64.0 in

## 2016-08-25 DIAGNOSIS — M79672 Pain in left foot: Secondary | ICD-10-CM | POA: Diagnosis not present

## 2016-08-25 DIAGNOSIS — G4700 Insomnia, unspecified: Secondary | ICD-10-CM

## 2016-08-25 MED ORDER — SUVOREXANT 20 MG PO TABS: 10.0000 mg | ORAL_TABLET | Freq: Every day | ORAL | 0 refills | Status: DC

## 2016-08-25 NOTE — Progress Notes (Signed)
Patient ID: Michelle [name], female    DOB: 11/26/1960, 55 y.o.   MRN: [id number]  PCP: Jenny Reichmann, MD  Subjective:   Chief Complaint  Patient presents with  . Foot Injury    left, off and on for a year, getting worse last 3 days    HPI Presents for evaluation of LEFT foot pain.  Intermittently experiences sudden pain in the LEFT foot, outer heel. First happened last year while visiting [place], [place]. Had been walking around all day, and then stopped at a restaurant. When she got up to go out, she felt sudden pain like she was walking on glass.  When it recurred about 3 days ago, it has persisted. Not worst when she first rises from rest. No pain on the plantar surface of the foot.  Feels like when she's had a stress fracture in the past, but it's not constant.  No OTC remedies, ice, etc have helped.  Planning to go to [place] in December to pick up the daughter they are adopting.  GERD is worsening. Prevacid intermittently. Afraid to take it daily. May need a third Nissen procedure.  Trazodone isn't working to help her sleep. Even at 200 mg.  Intolerant to Zolpidem and Lunesta. Tried Wellbutrin, adverse effects. Denies depression.    Review of Systems As above.    Patient Active Problem List   Diagnosis Date Noted  . Dizziness and giddiness 11/07/2015  . Gait difficulty 11/07/2015  . Abnormal brain MRI 11/07/2015  . Cough 04/02/2013  . Abnormal CXR 04/02/2013  . Leukocytosis 10/18/2012  . History of anaphylaxis 03/01/2012  . Depression 10/30/2011  . H/O suicide attempt 10/30/2011  . Hx of thyroid cancer   . Hypothyroidism   . Hypertension   . Asthma   . Gastroparesis   . Hypercholesteremia   . VAIN (vaginal intraepithelial neoplasia)   . GERD (gastroesophageal reflux disease) 07/16/2011     Prior to Admission medications   Medication Sig Start Date End Date Taking? Authorizing Provider  albuterol (VENTOLIN HFA) 108 (90 Base) MCG/ACT inhaler INHALE  TWO PUFFS BY MOUTH EVERY FOUR HOURS AS NEEDED FOR WHEEZING, SHORTNESS OF BREATH OR COUGH 07/18/16  Yes Wardell Honour, MD  cetirizine (ZYRTEC) 10 MG tablet TAKE ONE TABLET BY MOUTH AT BEDTIME 01/12/16  Yes Ezekiel Slocumb, PA-C  chlorpheniramine-HYDROcodone (TUSSIONEX PENNKINETIC ER) 10-8 MG/5ML SUER Take 5 mLs by mouth every 12 (twelve) hours as needed for cough. 07/18/16  Yes Wardell Honour, MD  EPINEPHrine 0.3 mg/0.3 mL IJ SOAJ injection Inject 0.3 mLs (0.3 mg total) into the muscle once. 10/24/14  Yes Darlyne Russian, MD  ezetimibe (ZETIA) 10 MG tablet Take 1 tablet (10 mg total) by mouth daily. 04/09/16  Yes Darlyne Russian, MD  metoprolol succinate (TOPROL-XL) 25 MG 24 hr tablet TAKE ONE TAB BY MOUTH EVERY MORN, IF BLOOD PRESSURE STILL ELEVATED TAKE 1 TAB IN AFTERNOON. 08/01/15  Yes Darlyne Russian, MD  MINIVELLE 0.05 MG/24HR patch APPLY ONE PATCH EXTERNALLY TWICE WEEKLY. 08/11/16  Yes Anastasio Auerbach, MD  ondansetron (ZOFRAN ODT) 4 MG disintegrating tablet Take 1 tablet (4 mg total) by mouth every 8 (eight) hours as needed for nausea or vomiting. 05/14/16  Yes Darlyne Russian, MD  PRESCRIPTION MEDICATION Place 1 drop into both eyes every morning. Reported on 12/08/2015   Yes Historical Provider, MD  PREVACID SOLUTAB 30 MG disintegrating tablet FOR DIRECTIONS ON HOW TO   TAKE THIS MEDICINE, READ  THE ENCLOSED MEDICATION    INFORMATION FORM 06/19/16  Yes Darlyne Russian, MD  rosuvastatin (CRESTOR) 20 MG tablet Take 1 tablet (20 mg total) by mouth daily. 04/09/16  Yes Darlyne Russian, MD  SYNTHROID 88 MCG tablet TAKE 1 TABLET DAILY BEFORE BREAKFAST 06/18/16  Yes Philemon Kingdom, MD  traZODone (DESYREL) 50 MG tablet Take 1-2 tablets (50-100 mg total) by mouth at bedtime as needed for sleep. 06/24/16  Yes Kyanna Mahrt, PA-C  valACYclovir (VALTREX) 500 MG tablet TAKE ONE TABLET BY MOUTH TWICE DAILY 08/03/16  Yes Anastasio Auerbach, MD  vitamin B-12 (CYANOCOBALAMIN) 1000 MCG tablet Take 1,000 mcg by mouth daily.     Yes  Historical Provider, MD  VIVELLE-DOT 0.075 MG/24HR APPLY ONE PATCH EXTERNALLY TWICE WEEKLY Patient not taking: Reported on 08/25/2016 01/02/16   Anastasio Auerbach, MD     Allergies  Allergen Reactions  . Clarithromycin Anaphylaxis  . Lunesta [Eszopiclone] Other (See Comments)    Causes sleep walking events  . Nutritional Supplements Anaphylaxis  . Pneumococcal Vaccines Anaphylaxis  . Sulfamethoxazole-Trimethoprim Anaphylaxis  . Tetanus Toxoids Swelling  . Wellbutrin [Bupropion Hcl] Other (See Comments)    seizure  . Zolpidem     Causes sleep walking events  . Bactrim Hives and Other (See Comments)    Severe headache  . Clarithromycin Itching and Other (See Comments)    Severe headache  . Doxycycline Other (See Comments)    Severe GI upset  . Hydone [Chlorthalidone] Other (See Comments)    Vasculitis   . Ranitidine Other (See Comments)    Acts like a diuretic   . Reglan [Metoclopramide] Other (See Comments)    Tremors and ticks, possible permanence of effects  . Sulfa Antibiotics Hives and Other (See Comments)    Severe headache  . Tetracyclines & Related Other (See Comments)    Severe GI upset       Objective:  Physical Exam  Constitutional: She is oriented to person, place, and time. She appears well-developed and well-nourished. She is active and cooperative. No distress.  BP 132/78 (BP Location: Left Arm, Patient Position: Sitting, Cuff Size: Normal)   Pulse 97   Resp 16   Ht 5\' 4"  (1.626 m)   LMP 05/23/2006   SpO2 99%   HENT:  Head: Normocephalic and atraumatic.  Right Ear: Hearing normal.  Left Ear: Hearing normal.  Eyes: Conjunctivae are normal. No scleral icterus.  Neck: Normal range of motion. Neck supple. No thyromegaly present.  Cardiovascular: Normal rate, regular rhythm and normal heart sounds.   Pulses:      Radial pulses are 2+ on the right side, and 2+ on the left side.  Pulmonary/Chest: Effort normal and breath sounds normal.  Musculoskeletal:        Left foot: There is normal range of motion, no tenderness, no bony tenderness, no swelling, normal capillary refill, no crepitus, no deformity and no laceration.       Feet:  Lymphadenopathy:       Head (right side): No tonsillar, no preauricular, no posterior auricular and no occipital adenopathy present.       Head (left side): No tonsillar, no preauricular, no posterior auricular and no occipital adenopathy present.    She has no cervical adenopathy.       Right: No supraclavicular adenopathy present.       Left: No supraclavicular adenopathy present.  Neurological: She is alert and oriented to person, place, and time. No sensory deficit.  Skin: Skin  is warm, dry and intact. No rash noted. No cyanosis or erythema. Nails show no clubbing.  Psychiatric: She has a normal mood and affect. Her speech is normal and behavior is normal.           Assessment & Plan:   1. Left foot pain Await radiology report. She has a CAM walker at home. Recommend using it consistently x 2 weeks and if pain persists, plan referral to orthopedics. - DG Foot Complete Left; Future  2. Insomnia, unspecified type History of prescription drug overdose, but denies that she has any thoughts of harming herselff now. Trial of Belsomra. - Suvorexant (BELSOMRA) 20 MG TABS; Take 10-20 mg by mouth at bedtime.  Dispense: 30 tablet; Refill: 0   Fara Chute, PA-C Physician Assistant-Certified [facility]

## 2016-08-25 NOTE — Patient Instructions (Signed)
     IF you received an x-ray today, you will receive an invoice from [facility]. Please contact [facility] at [phone] with questions or concerns regarding your invoice.   IF you received labwork today, you will receive an invoice from [facility]. Please contact [facility] at [phone] with questions or concerns regarding your invoice.   Our billing staff will not be able to assist you with questions regarding bills from these companies.  You will be contacted with the lab results as soon as they are available. The fastest way to get your results is to activate your My Chart account. Instructions are located on the last page of this paperwork. If you have not heard from us regarding the results in 2 weeks, please contact this office.      

## 2016-08-26 ENCOUNTER — Other Ambulatory Visit: Payer: Self-pay | Admitting: Emergency Medicine

## 2016-08-27 ENCOUNTER — Telehealth: Payer: Self-pay | Admitting: Family Medicine

## 2016-08-27 NOTE — Telephone Encounter (Signed)
Patient calling wanting a boot for her lt foot and she wants results on her foot please respond

## 2016-09-01 NOTE — Telephone Encounter (Signed)
Spoke with pt and provivded x-ray results. Also relayed message concerning boot. Pt states that she no longer has the boot. Pt states her foot is doing better and declined offer of boot as well as referral at this time.   Also, pt wanted to let you know that belsomra is working great, however she is taking 40mg  instead of 20mg   and would like to know if you can change her prescription to reflect this change. She is concerned about running out quicker due to increase in dosage.

## 2016-09-01 NOTE — Telephone Encounter (Signed)
Please advise the patient that 20 mg daily is the maximum dose of the Belsomra. I am sorry that I wasn't clear about that. The recommended doses are 10, 15 or 20 mg, but NOT MORE THAN THAT!

## 2016-09-01 NOTE — Telephone Encounter (Signed)
Please see xray result and advise on boot.

## 2016-09-01 NOTE — Telephone Encounter (Signed)
We called the patient x 2, and left messages for her regarding the radiographs of her foot.  There is NO fracture. AS we discussed at her visit, she had a CAM walker (boot) at home and was going to wear that x 2 weeks, and if the pain persisted, I was planning to send her to orthopedics for additional evaluation.  If she doesn't have a CAM walker at home, she can come in and get fitted for one. If she'd like to go ahead and see orthopedics, I am happy to refer her-who would she prefer to see?

## 2016-09-02 NOTE — Telephone Encounter (Signed)
Pt. Advised, she will stick with 20mg  only but states it does not help her sleep.

## 2016-09-02 NOTE — Telephone Encounter (Signed)
I'm not sure it's worth taking it if it doesn't help. We could set her up with neurology or psychiatry to explore other options to help her sleep, if she'd like.

## 2016-09-03 NOTE — Telephone Encounter (Signed)
Pt reported that the Belsomra does seem to help some when she combines it with Benedryl, so it is better than nothing at this point. She did agree to call [name] office who also has a psychiatrist there and see if she can get an appt to discuss other options for sleep. Pt stated that she will call back if she needs a RF of Belsomra before she gets appt w/psych.

## 2016-10-06 ENCOUNTER — Other Ambulatory Visit: Payer: Self-pay | Admitting: Internal Medicine

## 2016-10-29 ENCOUNTER — Other Ambulatory Visit: Payer: Self-pay | Admitting: Emergency Medicine

## 2016-10-29 ENCOUNTER — Other Ambulatory Visit: Payer: Self-pay | Admitting: Gynecology

## 2016-11-02 ENCOUNTER — Other Ambulatory Visit: Payer: Self-pay | Admitting: Gynecology

## 2016-11-03 ENCOUNTER — Ambulatory Visit (INDEPENDENT_AMBULATORY_CARE_PROVIDER_SITE_OTHER): Payer: BLUE CROSS/BLUE SHIELD | Admitting: Family Medicine

## 2016-11-03 ENCOUNTER — Encounter: Payer: Self-pay | Admitting: Family Medicine

## 2016-11-03 VITALS — BP 136/84 | HR 81 | Temp 98.3°F | Resp 16

## 2016-11-03 DIAGNOSIS — I10 Essential (primary) hypertension: Secondary | ICD-10-CM

## 2016-11-03 DIAGNOSIS — E041 Nontoxic single thyroid nodule: Secondary | ICD-10-CM | POA: Diagnosis not present

## 2016-11-03 DIAGNOSIS — Z9151 Personal history of suicidal behavior: Secondary | ICD-10-CM

## 2016-11-03 DIAGNOSIS — J4540 Moderate persistent asthma, uncomplicated: Secondary | ICD-10-CM | POA: Diagnosis not present

## 2016-11-03 DIAGNOSIS — K3184 Gastroparesis: Secondary | ICD-10-CM | POA: Diagnosis not present

## 2016-11-03 DIAGNOSIS — Z1231 Encounter for screening mammogram for malignant neoplasm of breast: Secondary | ICD-10-CM | POA: Diagnosis not present

## 2016-11-03 DIAGNOSIS — Z915 Personal history of self-harm: Secondary | ICD-10-CM | POA: Diagnosis not present

## 2016-11-03 DIAGNOSIS — Z85850 Personal history of malignant neoplasm of thyroid: Secondary | ICD-10-CM | POA: Diagnosis not present

## 2016-11-03 DIAGNOSIS — E7800 Pure hypercholesterolemia, unspecified: Secondary | ICD-10-CM

## 2016-11-03 DIAGNOSIS — K219 Gastro-esophageal reflux disease without esophagitis: Secondary | ICD-10-CM | POA: Diagnosis not present

## 2016-11-03 DIAGNOSIS — G4700 Insomnia, unspecified: Secondary | ICD-10-CM | POA: Diagnosis not present

## 2016-11-03 MED ORDER — ONDANSETRON 4 MG PO TBDP: 4.0000 mg | ORAL_TABLET | Freq: Three times a day (TID) | ORAL | 1 refills | Status: DC | PRN

## 2016-11-03 MED ORDER — SUVOREXANT 20 MG PO TABS: 20.0000 mg | ORAL_TABLET | Freq: Every day | ORAL | 5 refills | Status: DC

## 2016-11-03 NOTE — Patient Instructions (Signed)
     IF you received an x-ray today, you will receive an invoice from [facility]. Please contact [facility] at [phone] with questions or concerns regarding your invoice.   IF you received labwork today, you will receive an invoice from [facility]. Please contact [facility] at [phone] with questions or concerns regarding your invoice.   Our billing staff will not be able to assist you with questions regarding bills from these companies.  You will be contacted with the lab results as soon as they are available. The fastest way to get your results is to activate your My Chart account. Instructions are located on the last page of this paperwork. If you have not heard from us regarding the results in 2 weeks, please contact this office.      

## 2016-11-03 NOTE — Progress Notes (Signed)
 Subjective:    Patient ID: Michelle [name], female    DOB: 03/06/1961, 55 y.o.   MRN: [id number]  11/03/2016  Medication Refill (zofran)   HPI This 55 y.o. female presents for one year follow-up for hypercholesterolemia and thyroid nodules and anxiety with depression and GERD.  Traveling to [place] next month to adopt a six year old [place] child.  Friend has adopted four children in the past three years.  Going to have two Chinese children for the next month; complex children; couple is struggling.Corban is five year old; Down's syndrome; non-verbal. Tilley is ill-adjusted; lived in orphanage for several years.  Good support group.  Mackenzie; will be [age] in May; developmentally will be at [age] years of age.   Down's syndrome; does not self feed.  Walks on toes.  Does not drink from a cup. Right now in a foster family within the orphanage.  Cerebral palsy.  No CT scan.  Three children.  Doing very well regarding overall health; husband thinks she is doing really well.   Immunization History  Administered Date(s) Administered  . Influenza Split 08/24/2015  . Influenza-Unspecified 08/15/2016  . PPD Test 12/15/2015   BP Readings from Last 3 Encounters:  11/03/16 136/84  08/25/16 132/78  07/18/16 (!) 138/98   Wt Readings from Last 3 Encounters:  07/18/16 137 lb (62.1 kg)  06/24/16 140 lb (63.5 kg)  01/13/16 136 lb (61.7 kg)   Insomnia: Belsomra prescribed by Chelle.  Took two Belsomra 20mg and slept great.  Had been taking Gabapentin but does not want to take it; taking sister's 300mg Gabapentin.  Was in system Gabapentin.  Taking Gabapentin instead of Belsomra because ran out of it.    Gastroparesis: takes Zofran three days per week.  Chronic issue for nausea.   Review of Systems  Constitutional: Negative for chills, diaphoresis, fatigue and fever.  Eyes: Negative for visual disturbance.  Respiratory: Negative for cough and shortness of breath.   Cardiovascular: Negative for chest  pain, palpitations and leg swelling.  Gastrointestinal: Negative for abdominal pain, constipation, diarrhea, nausea and vomiting.  Endocrine: Negative for cold intolerance, heat intolerance, polydipsia, polyphagia and polyuria.  Neurological: Negative for dizziness, tremors, seizures, syncope, facial asymmetry, speech difficulty, weakness, light-headedness, numbness and headaches.  Psychiatric/Behavioral: Positive for sleep disturbance. The patient is nervous/anxious.     Past Medical History:  Diagnosis Date  . Allergy   . Anaphylaxis   . Asthma   . Depression   . Gastroparesis   . Gastroparesis    history of  . GERD (gastroesophageal reflux disease)   . Headache(784.0)    migraines  . Hypercholesteremia   . Hypothyroidism   . Osteoarthritis   . Osteopenia 06/2013   T score -1.2 FRAX 8.6%/0.6%  . Overdose, drug 10/30/2011  . PONV (postoperative nausea and vomiting)   . Reflux   . Seizures (HCC)    1 seizure secondary to wellbutrin-none since  . Thyroid cancer (HCC)    papillary  . VAIN I (vaginal intraepithelial neoplasia grade I) 09/2013, 11/2014    with positive high-risk HPV screen    Past Surgical History:  Procedure Laterality Date  . BREAST SURGERY     reduc tion mammoplasty  . CHOLECYSTECTOMY  2004  . COLPOSCOPY  02/2012   VAIN I  . COSMETIC SURGERY     buttock liposuction  . HERNIA REPAIR  2012  . NISSEN FUNDOPLICATION  2004, 2012   REFLUX  . THYROID SURGERY  2007     for thyroid cancer  . UPPER GASTROINTESTINAL ENDOSCOPY  08/26/2004  . VAGINAL HYSTERECTOMY  2007   irregular bleeding  . VARICOSE VEIN SURGERY  03/20/2003   right leg   Allergies  Allergen Reactions  . Clarithromycin Anaphylaxis  . Lunesta [Eszopiclone] Other (See Comments)    Causes sleep walking events  . Nutritional Supplements Anaphylaxis  . Pneumococcal Vaccines Anaphylaxis  . Sulfamethoxazole-Trimethoprim Anaphylaxis  . Tetanus Toxoids Swelling  . Wellbutrin [Bupropion Hcl] Other  (See Comments)    seizure  . Zolpidem     Causes sleep walking events  . Bactrim Hives and Other (See Comments)    Severe headache  . Clarithromycin Itching and Other (See Comments)    Severe headache  . Doxycycline Other (See Comments)    Severe GI upset  . Hydone [Chlorthalidone] Other (See Comments)    Vasculitis   . Ranitidine Other (See Comments)    Acts like a diuretic   . Reglan [Metoclopramide] Other (See Comments)    Tremors and ticks, possible permanence of effects  . Sulfa Antibiotics Hives and Other (See Comments)    Severe headache  . Tetracyclines & Related Other (See Comments)    Severe GI upset    Social History   Social History  . Marital status: Married    Spouse name: [name]  . Number of children: 1  . Years of education: College   Occupational History  . homemaker    Social History Main Topics  . Smoking status: Never Smoker  . Smokeless tobacco: Never Used  . Alcohol use No  . Drug use: No  . Sexual activity: Yes    Partners: Male    Birth control/ protection: Surgical     Comment: 1st intercourse 22 yo-1 partner-HYST   Other Topics Concern  . Not on file   Social History Narrative   Exercise: Yes.   Lives with her husband. Marital discord.   Receiving therapy with Vivia Budge.   Son lives locally.   Family History  Problem Relation Age of Onset  . Heart disease Mother   . Hypertension Mother   . Heart disease Father   . Hypertension Father   . Emphysema Father 23    was a smoker  . Heart disease Sister   . Hypertension Sister   . Diabetes Paternal Grandmother   . Multiple sclerosis Other   . Cancer Maternal Grandmother   . Heart disease Brother   . Leukemia Maternal Grandfather   . Cancer Maternal Aunt     leukemia       Objective:    BP 136/84 (BP Location: Left Arm, Patient Position: Sitting, Cuff Size: Normal)   Pulse 81   Temp 98.3 F (36.8 C)   Resp 16   LMP 05/23/2006   SpO2 98%  Physical Exam    Constitutional: She is oriented to person, place, and time. She appears well-developed and well-nourished. No distress.  HENT:  Head: Normocephalic and atraumatic.  Right Ear: External ear normal.  Left Ear: External ear normal.  Nose: Nose normal.  Mouth/Throat: Oropharynx is clear and moist.  Eyes: Conjunctivae and EOM are normal. Pupils are equal, round, and reactive to light.  Neck: Normal range of motion. Neck supple. Carotid bruit is not present. No thyromegaly present.  Cardiovascular: Normal rate, regular rhythm, normal heart sounds and intact distal pulses.  Exam reveals no gallop and no friction rub.   No murmur heard. Pulmonary/Chest: Effort normal and breath sounds normal. She has no  wheezes. She has no rales.  Abdominal: Soft. Bowel sounds are normal. She exhibits no distension and no mass. There is no tenderness. There is no rebound and no guarding.  Lymphadenopathy:    She has no cervical adenopathy.  Neurological: She is alert and oriented to person, place, and time. No cranial nerve deficit.  Skin: Skin is warm and dry. No rash noted. She is not diaphoretic. No erythema. No pallor.  Psychiatric: She has a normal mood and affect. Her behavior is normal.   Results for orders placed or performed in visit on 06/24/16  CBC with Differential/Platelet  Result Value Ref Range   WBC 11.6 (H) 3.8 - 10.8 K/uL   RBC 4.72 3.80 - 5.10 MIL/uL   Hemoglobin 15.1 11.7 - 15.5 g/dL   HCT 42.9 35.0 - 45.0 %   MCV 90.9 80.0 - 100.0 fL   MCH 32.0 27.0 - 33.0 pg   MCHC 35.2 32.0 - 36.0 g/dL   RDW 13.2 11.0 - 15.0 %   Platelets 254 140 - 400 K/uL   MPV 9.1 7.5 - 12.5 fL   Neutro Abs 5,916 1,500 - 7,800 cells/uL   Lymphs Abs 4,292 (H) 850 - 3,900 cells/uL   Monocytes Absolute 812 200 - 950 cells/uL   Eosinophils Absolute 464 15 - 500 cells/uL   Basophils Absolute 116 0 - 200 cells/uL   Neutrophils Relative % 51 %   Lymphocytes Relative 37 %   Monocytes Relative 7 %   Eosinophils  Relative 4 %   Basophils Relative 1 %   Smear Review Criteria for review not met   Comprehensive metabolic panel  Result Value Ref Range   Sodium 140 135 - 146 mmol/L   Potassium 4.4 3.5 - 5.3 mmol/L   Chloride 103 98 - 110 mmol/L   CO2 27 20 - 31 mmol/L   Glucose, Bld 86 65 - 99 mg/dL   BUN 9 7 - 25 mg/dL   Creat 0.82 0.50 - 1.05 mg/dL   Total Bilirubin 0.9 0.2 - 1.2 mg/dL   Alkaline Phosphatase 57 33 - 130 U/L   AST 39 (H) 10 - 35 U/L   ALT 45 (H) 6 - 29 U/L   Total Protein 7.2 6.1 - 8.1 g/dL   Albumin 4.6 3.6 - 5.1 g/dL   Calcium 9.8 8.6 - 10.4 mg/dL  Lipid panel  Result Value Ref Range   Cholesterol 207 (H) 125 - 200 mg/dL   Triglycerides 335 (H) <150 mg/dL   HDL 55 >=46 mg/dL   Total CHOL/HDL Ratio 3.8 <=5.0 Ratio   VLDL 67 (H) <30 mg/dL   LDL Cholesterol 85 <130 mg/dL  TSH  Result Value Ref Range   TSH 0.50 mIU/L   Depression screen PHQ 2/9 11/03/2016 08/25/2016 07/18/2016 06/24/2016 01/13/2016  Decreased Interest 0 0 0 0 0  Down, Depressed, Hopeless - 0 0 0 0  PHQ - 2 Score 0 0 0 0 0  Altered sleeping - - - - -  Tired, decreased energy - - - - -  Change in appetite - - - - -  Feeling bad or failure about yourself  - - - - -  Trouble concentrating - - - - -  Moving slowly or fidgety/restless - - - - -  Suicidal thoughts - - - - -  PHQ-9 Score - - - - -  Some recent data might be hidden       Assessment & Plan:   1. Hypercholesteremia     2. Insomnia, unspecified type   3. Encounter for screening mammogram for breast cancer   4. Thyroid nodule   5. Essential hypertension   6. Moderate persistent asthma without complication   7. Gastroesophageal reflux disease without esophagitis   8. Gastroparesis   9. H/O suicide attempt   10. Hx of thyroid cancer     Orders Placed This Encounter  Procedures  . US THYROID    EPIC ORDER/ [id number]/NO NEEDS/INS-BCBS/CLC/PT    Standing Status:   Future    Number of Occurrences:   1    Standing Expiration Date:    01/04/2018    Order Specific Question:   Reason for Exam (SYMPTOM  OR DIAGNOSIS REQUIRED)    Answer:   L thyroid nodule 9m in 2016    Order Specific Question:   Preferred imaging location?    Answer:   [facility]   Meds ordered this encounter  Medications  . DISCONTD: ondansetron (ZOFRAN ODT) 4 MG disintegrating tablet    Sig: Take 1 tablet (4 mg total) by mouth every 8 (eight) hours as needed for nausea or vomiting.    Dispense:  90 tablet    Refill:  1    #90 is a 3 mos supply  . Suvorexant (BELSOMRA) 20 MG TABS    Sig: Take 20 mg by mouth at bedtime.    Dispense:  30 tablet    Refill:  5  . ondansetron (ZOFRAN ODT) 4 MG disintegrating tablet    Sig: Take 1 tablet (4 mg total) by mouth every 8 (eight) hours as needed for nausea or vomiting.    Dispense:  90 tablet    Refill:  1    #90 is a 3 mos supply    Return in about 6 months (around 05/04/2017) for recheck high blood pressure, high cholesterol.   Kristi MElayne Guerin M.D. [facility]  [phone] [phone]

## 2016-11-06 ENCOUNTER — Ambulatory Visit
Admission: RE | Admit: 2016-11-06 | Discharge: 2016-11-06 | Disposition: A | Discharge status: Home / Self Care | Payer: BLUE CROSS/BLUE SHIELD | Source: Ambulatory Visit | Attending: Family Medicine | Admitting: Family Medicine

## 2016-11-06 DIAGNOSIS — E041 Nontoxic single thyroid nodule: Secondary | ICD-10-CM

## 2016-11-08 ENCOUNTER — Other Ambulatory Visit: Payer: Self-pay | Admitting: Emergency Medicine

## 2016-11-09 ENCOUNTER — Other Ambulatory Visit: Payer: Self-pay | Admitting: Family Medicine

## 2016-11-09 DIAGNOSIS — Z1231 Encounter for screening mammogram for malignant neoplasm of breast: Secondary | ICD-10-CM

## 2016-11-11 ENCOUNTER — Telehealth: Payer: Self-pay

## 2016-11-11 NOTE — Telephone Encounter (Signed)
PATIENT STATES SOMEONE FROM OUR OFFICE TRIED TO CALL HER EITHER Monday OR Tuesday TO GIVE HER THE RESULTS OF HER THYROID ULTRASOUND, BUT SHE HAS BEEN GONE FOR 2 DAYS. SHE IS CALLING BACK TO GET THOSE RESULTS. DR. Cleotis Nipper IT. BEST PHONE [phone] (CELL)  [facility]ARMACY CHOICE IS [facility] [address].  [place]

## 2016-11-11 NOTE — Telephone Encounter (Signed)
10/2016 last ov 

## 2016-11-15 ENCOUNTER — Other Ambulatory Visit: Payer: Self-pay | Admitting: Emergency Medicine

## 2016-11-17 NOTE — Telephone Encounter (Signed)
Please see result and advise. 

## 2016-11-18 NOTE — Telephone Encounter (Signed)
Please see notes below that are included on thyroid US results.  Rad tech---please attempt to call patient back with these results.    Result Notes   Notes Recorded by Carmela Hurt, RT on 11/10/2016 at 9:04 AM EST Attempted to call pt, left VM for pt to call back ------  Notes Recorded by Wardell Honour, MD on 11/09/2016 at 1:31 PM EST Call -- thyroid ultrasound stable; L thyroid nodule is unchanged in size.

## 2016-11-26 ENCOUNTER — Other Ambulatory Visit: Payer: Self-pay | Admitting: Surgery

## 2016-11-26 ENCOUNTER — Ambulatory Visit: Payer: BLUE CROSS/BLUE SHIELD

## 2016-11-26 DIAGNOSIS — E041 Nontoxic single thyroid nodule: Secondary | ICD-10-CM

## 2016-11-27 ENCOUNTER — Telehealth: Payer: Self-pay

## 2016-11-27 NOTE — Telephone Encounter (Signed)
PATIENT NEEDS TO GET A REFILL ON ROSUVASTATIN 20 MG. LAST REFILLED BY DR. Everlene Farrier. BEST PHONE [phone] ([facility])  (REFERENCE #) [id number]    [place]

## 2016-11-28 MED ORDER — ROSUVASTATIN CALCIUM 20 MG PO TABS: 20.0000 mg | ORAL_TABLET | Freq: Every day | ORAL | 1 refills | Status: DC

## 2016-11-28 NOTE — Telephone Encounter (Signed)
Spoke with pt - she states she was called 2 days ago with same report.  I apologized for repetition.  Pt req refill of rosuvastatin - Follow up due in 6 mo. 6 mo supply sent in.

## 2016-11-29 NOTE — Telephone Encounter (Signed)
Filled 11/28/16

## 2016-12-03 ENCOUNTER — Other Ambulatory Visit: Payer: Self-pay | Admitting: Family Medicine

## 2016-12-05 NOTE — Telephone Encounter (Signed)
Needs a referral to go to an international adoption clinic for all of the family.  Leaving to go over on the 25th,  Coming back on the 10th.    [phone]   Dr Tamala Julian or Domingo Mend

## 2016-12-08 ENCOUNTER — Telehealth: Payer: Self-pay | Admitting: *Deleted

## 2016-12-08 MED ORDER — ACYCLOVIR 5 % EX OINT: TOPICAL_OINTMENT | CUTANEOUS | 1 refills | Status: DC

## 2016-12-08 NOTE — Telephone Encounter (Signed)
Pt informed with the below note, Rx sent. 

## 2016-12-08 NOTE — Telephone Encounter (Signed)
Pt called requesting acyclovir ointment for fever blister, will be leaving for [place] to adopt a child and would like to have Rx on hand for this. You have prescribed valtrex before, states this takes long to work, ointment much faster. Please advise

## 2016-12-08 NOTE — Telephone Encounter (Signed)
Okay for acyclovir ointment, one tube, apply 5 times daily with outbreak refill 1

## 2016-12-10 NOTE — Telephone Encounter (Signed)
Please call for details?  Please contact the adoption clinic to determine exactly what they need from me. I cannot enter a referral to an adoption clinic in epic; thus, I will need some assistance figuring this out.

## 2016-12-12 ENCOUNTER — Telehealth: Payer: Self-pay

## 2016-12-12 NOTE — Telephone Encounter (Signed)
Patient states that she has called numerous time trying to get in touch with either Chelle or Dr Tamala Julian about some medication for her upcoming [place] trip and she has heard nothing back, I informed her that there were no messages put in the computer about this but that I would put one in for her and mark it high priority since she is leaving Thursday for [place].  She wants one of these two providers to call her back ASAP. She stated to try both her numbers until you get her they are [phone] for home and [phone] is her cell.

## 2016-12-12 NOTE — Telephone Encounter (Signed)
I will not be in the office until Monday afternoon. Please find out what the patient needs, and any details regarding quantity, as she will be out of the country and I do not know how long her trip will be.

## 2016-12-12 NOTE — Telephone Encounter (Signed)
I donot see a prior message?

## 2016-12-14 MED ORDER — ACYCLOVIR 5 % EX OINT: TOPICAL_OINTMENT | CUTANEOUS | 2 refills | Status: DC

## 2016-12-14 MED ORDER — PERMETHRIN 5 % EX CREA: 1.0000 "application " | TOPICAL_CREAM | Freq: Once | CUTANEOUS | 0 refills | Status: AC

## 2016-12-14 NOTE — Telephone Encounter (Signed)
International adoption ([place]) clinic referral letter  She will drop off letter to sign and stamp so she can take with take with her. (it explains where they are going and the child they will adopt)    Needs Ointment for scabies on  hand for travel  Needs another refill of valcyclovir for fever bilsters r/t stress.

## 2016-12-14 NOTE — Telephone Encounter (Signed)
See next note

## 2016-12-14 NOTE — Telephone Encounter (Signed)
Meds ordered this encounter  Medications  . acyclovir ointment (ZOVIRAX) 5 %    Sig: APPLY ONE APPLICATION TOPICALLY EVERY THREE HOURS    Dispense:  30 g    Refill:  2    Order Specific Question:   Supervising Provider    Answer:   SHAW, EVA N [[id number]]  . permethrin (ELIMITE) 5 % cream    Sig: Apply 1 application topically once.    Dispense:  120 g    Refill:  0    Order Specific Question:   Supervising Provider    Answer:   SHAW, EVA N [[id number]]   I need more information for the referral form. Child's name, sex, DOB, country of origin (presumably [place], but please confirm), any concerns they are aware of for this child?

## 2016-12-15 NOTE — Telephone Encounter (Signed)
FORM FILLED OUT, GIVEN TO Michelle [name] AND COPY SENT TO SCANNING

## 2016-12-26 ENCOUNTER — Other Ambulatory Visit: Payer: Self-pay | Admitting: Internal Medicine

## 2017-01-04 ENCOUNTER — Ambulatory Visit (INDEPENDENT_AMBULATORY_CARE_PROVIDER_SITE_OTHER): Payer: BLUE CROSS/BLUE SHIELD | Admitting: Physician Assistant

## 2017-01-04 VITALS — BP 118/76 | HR 94 | Temp 98.1°F | Resp 18

## 2017-01-04 DIAGNOSIS — J0100 Acute maxillary sinusitis, unspecified: Secondary | ICD-10-CM

## 2017-01-04 MED ORDER — AMOXICILLIN-POT CLAVULANATE 875-125 MG PO TABS: 1.0000 | ORAL_TABLET | Freq: Two times a day (BID) | ORAL | 0 refills | Status: AC

## 2017-01-04 MED ORDER — AZELASTINE HCL 0.15 % NA SOLN: 2.0000 | Freq: Two times a day (BID) | NASAL | 0 refills | Status: DC

## 2017-01-04 MED ORDER — HYDROCOD POLST-CPM POLST ER 10-8 MG/5ML PO SUER
5.0000 mL | Freq: Two times a day (BID) | ORAL | 0 refills | Status: DC | PRN
Start: 2017-01-04 — End: 2017-07-09

## 2017-01-04 NOTE — Progress Notes (Signed)
Patient ID: Michelle [name], female    DOB: 1961/05/26, 56 y.o.   MRN: [id number]  PCP: Reginia Forts, MD  Chief Complaint  Patient presents with  . Sinus Problem    visited [place]    Subjective:   Presents for evaluation of sinus pressure.  Pt is a 56 yo Caucasian female with a history of asthma who presents with sinus pressure and headache x 3-4 days. Pt and her husband just returned 2 days ago from a trip to [place] where they adopted a little girl. She complains of 10 days of cough, rhinorrhea, and congestion. Sinus pressure, worse on left, and headache developed 3-4 days ago. She also complains of left ear pain. She had to use her inhaler often while in [place] because of pollutants, but hasn't used it since she arrived back home.  Admits to some constipation from traveling. Denies fever, chills, night sweats, rash, SOB, chest pain, palpitations, abdominal pain, nausea, vomiting, diarrhea, or myalgias.  She has been taking Advil for back pain, but hasn't noticed if it helped her other symptoms, and Flonase with mild relief.   Review of Systems See HPI.    Patient Active Problem List   Diagnosis Date Noted  . Insomnia 08/25/2016  . Abnormal brain MRI 11/07/2015  . Cough 04/02/2013  . Abnormal CXR 04/02/2013  . Leukocytosis 10/18/2012  . History of anaphylaxis 03/01/2012  . Depression 10/30/2011  . H/O suicide attempt 10/30/2011  . Hx of thyroid cancer   . Hypothyroidism   . Hypertension   . Asthma   . Gastroparesis   . Hypercholesteremia   . VAIN (vaginal intraepithelial neoplasia)   . GERD (gastroesophageal reflux disease) 07/16/2011     Prior to Admission medications   Medication Sig Start Date End Date Taking? Authorizing Provider  acyclovir ointment (ZOVIRAX) 5 % APPLY ONE APPLICATION TOPICALLY EVERY THREE HOURS 12/14/16  Yes Chelle Jeffery, PA-C  albuterol (VENTOLIN HFA) 108 (90 Base) MCG/ACT inhaler INHALE TWO PUFFS BY MOUTH EVERY FOUR HOURS AS NEEDED FOR  WHEEZING, SHORTNESS OF BREATH OR COUGH 07/18/16  Yes Wardell Honour, MD  cetirizine (ZYRTEC) 10 MG tablet TAKE ONE TABLET BY MOUTH AT BEDTIME 01/12/16  Yes Bennett Scrape V, PA-C  chlorpheniramine-HYDROcodone (TUSSIONEX PENNKINETIC ER) 10-8 MG/5ML SUER Take 5 mLs by mouth every 12 (twelve) hours as needed for cough. 07/18/16  Yes Wardell Honour, MD  EPINEPHrine 0.3 mg/0.3 mL IJ SOAJ injection Inject 0.3 mLs (0.3 mg total) into the muscle once. 10/24/14  Yes Darlyne Russian, MD  estradiol (VIVELLE-DOT) 0.075 MG/24HR APPLY 1 PATCH ONTO THE SKIN2 TIMES A WEEK 10/29/16  Yes Anastasio Auerbach, MD  ezetimibe (ZETIA) 10 MG tablet TAKE 1 TABLET DAILY 11/18/16  Yes Wardell Honour, MD  metoprolol succinate (TOPROL-XL) 25 MG 24 hr tablet TAKE 1 TABLET EVERY        MORNING, IF BLOOD PRESSURE STILL ELEVATED TAKE 1      TABLET IN AFTERNOON 08/30/16  Yes Chelle Jeffery, PA-C  MINIVELLE 0.05 MG/24HR patch APPLY ONE PATCH EXTERNALLY TWICE WEEKLY. 08/11/16  Yes Anastasio Auerbach, MD  ondansetron (ZOFRAN ODT) 4 MG disintegrating tablet Take 1 tablet (4 mg total) by mouth every 8 (eight) hours as needed for nausea or vomiting. 11/03/16  Yes Wardell Honour, MD  PRESCRIPTION MEDICATION Place 1 drop into both eyes every morning. Reported on 12/08/2015   Yes Historical Provider, MD  PREVACID SOLUTAB 30 MG disintegrating tablet FOR DIRECTIONS ON HOW TO  TAKE THIS MEDICINE, READ   THE ENCLOSED MEDICATION    INFORMATION FORM 11/11/16  Yes Wardell Honour, MD  rosuvastatin (CRESTOR) 20 MG tablet Take 1 tablet (20 mg total) by mouth daily. 11/28/16  Yes Wardell Honour, MD  Suvorexant (BELSOMRA) 20 MG TABS Take 20 mg by mouth at bedtime. 11/03/16  Yes Wardell Honour, MD  SYNTHROID 88 MCG tablet TAKE 1 TABLET DAILY BEFORE BREAKFAST 12/28/16  Yes Philemon Kingdom, MD  traZODone (DESYREL) 50 MG tablet Take 1-2 tablets (50-100 mg total) by mouth at bedtime as needed for sleep. 06/24/16  Yes Chelle Jeffery, PA-C  valACYclovir (VALTREX) 500 MG tablet  TAKE ONE TABLET BY MOUTH TWICE DAILY 08/03/16  Yes Anastasio Auerbach, MD  vitamin B-12 (CYANOCOBALAMIN) 1000 MCG tablet Take 1,000 mcg by mouth daily.     Yes Historical Provider, MD  VIVELLE-DOT 0.075 MG/24HR APPLY ONE PATCH EXTERNALLY TWICE WEEKLY 11/02/16  Yes Huel Cote, NP     Allergies  Allergen Reactions  . Clarithromycin Anaphylaxis  . Lunesta [Eszopiclone] Other (See Comments)    Causes sleep walking events  . Nutritional Supplements Anaphylaxis  . Pneumococcal Vaccines Anaphylaxis  . Sulfamethoxazole-Trimethoprim Anaphylaxis  . Tetanus Toxoids Swelling  . Wellbutrin [Bupropion Hcl] Other (See Comments)    seizure  . Zolpidem     Causes sleep walking events  . Bactrim Hives and Other (See Comments)    Severe headache  . Clarithromycin Itching and Other (See Comments)    Severe headache  . Doxycycline Other (See Comments)    Severe GI upset  . Hydone [Chlorthalidone] Other (See Comments)    Vasculitis   . Ranitidine Other (See Comments)    Acts like a diuretic   . Reglan [Metoclopramide] Other (See Comments)    Tremors and ticks, possible permanence of effects  . Sulfa Antibiotics Hives and Other (See Comments)    Severe headache  . Tetracyclines & Related Other (See Comments)    Severe GI upset       Objective:  Physical Exam HEENT: PERRLA. Throat is mildly erythematous, no exudates. Ear canals clear, TMs intact, non-bulging. Mild anterior cervical lymphadenopathy, rubbery, mobile, no tenderness to palpation of neck. No tenderness to palpation of frontal or maxillary sinus. Pulm: Good respiratory effort. Mild end-expiratory wheezes bilaterally. No rales or rhonchi. CV: RRR. No M/R/G. Abd: Soft, non-tender, non-distended.     Assessment & Plan:   1. Acute non-recurrent maxillary sinusitis Pt advised to drink plenty of water and get rest.  - Azelastine HCl 0.15 % SOLN; Place 2 sprays into both nostrils 2 (two) times daily.  Dispense: 30 mL; Refill: 0 -  amoxicillin-clavulanate (AUGMENTIN) 875-125 MG tablet; Take 1 tablet by mouth 2 (two) times daily.  Dispense: 20 tablet; Refill: 0 - chlorpheniramine-HYDROcodone (TUSSIONEX PENNKINETIC ER) 10-8 MG/5ML SUER; Take 5 mLs by mouth every 12 (twelve) hours as needed for cough.  Dispense: 100 mL; Refill: 0  Lorella Nimrod, PA-S

## 2017-01-04 NOTE — Progress Notes (Deleted)
Patient ID: Michelle [name], female    DOB: [birth year], 56 y.o.   MRN: [id number]  PCP: Reginia Forts, MD  Chief Complaint  Patient presents with  . Sinus Problem    visited [place]    Subjective:   Presents for evaluation of ***.  ***.    Review of Systems     Patient Active Problem List   Diagnosis Date Noted  . Insomnia 08/25/2016  . Abnormal brain MRI 11/07/2015  . Cough 04/02/2013  . Abnormal CXR 04/02/2013  . Leukocytosis 10/18/2012  . History of anaphylaxis 03/01/2012  . Depression 10/30/2011  . H/O suicide attempt 10/30/2011  . Hx of thyroid cancer   . Hypothyroidism   . Hypertension   . Asthma   . Gastroparesis   . Hypercholesteremia   . VAIN (vaginal intraepithelial neoplasia)   . GERD (gastroesophageal reflux disease) 07/16/2011     Prior to Admission medications   Medication Sig Start Date End Date Taking? Authorizing Provider  acyclovir ointment (ZOVIRAX) 5 % APPLY ONE APPLICATION TOPICALLY EVERY THREE HOURS 12/14/16  Yes Chelle Jeffery, PA-C  albuterol (VENTOLIN HFA) 108 (90 Base) MCG/ACT inhaler INHALE TWO PUFFS BY MOUTH EVERY FOUR HOURS AS NEEDED FOR WHEEZING, SHORTNESS OF BREATH OR COUGH 07/18/16  Yes Wardell Honour, MD  cetirizine (ZYRTEC) 10 MG tablet TAKE ONE TABLET BY MOUTH AT BEDTIME 01/12/16  Yes Bennett Scrape V, PA-C  chlorpheniramine-HYDROcodone (TUSSIONEX PENNKINETIC ER) 10-8 MG/5ML SUER Take 5 mLs by mouth every 12 (twelve) hours as needed for cough. 07/18/16  Yes Wardell Honour, MD  EPINEPHrine 0.3 mg/0.3 mL IJ SOAJ injection Inject 0.3 mLs (0.3 mg total) into the muscle once. 10/24/14  Yes Darlyne Russian, MD  estradiol (VIVELLE-DOT) 0.075 MG/24HR APPLY 1 PATCH ONTO THE SKIN2 TIMES A WEEK 10/29/16  Yes Anastasio Auerbach, MD  ezetimibe (ZETIA) 10 MG tablet TAKE 1 TABLET DAILY 11/18/16  Yes Wardell Honour, MD  metoprolol succinate (TOPROL-XL) 25 MG 24 hr tablet TAKE 1 TABLET EVERY        MORNING, IF BLOOD PRESSURE STILL ELEVATED TAKE 1       TABLET IN AFTERNOON 08/30/16  Yes Chelle Jeffery, PA-C  MINIVELLE 0.05 MG/24HR patch APPLY ONE PATCH EXTERNALLY TWICE WEEKLY. 08/11/16  Yes Anastasio Auerbach, MD  ondansetron (ZOFRAN ODT) 4 MG disintegrating tablet Take 1 tablet (4 mg total) by mouth every 8 (eight) hours as needed for nausea or vomiting. 11/03/16  Yes Wardell Honour, MD  PRESCRIPTION MEDICATION Place 1 drop into both eyes every morning. Reported on 12/08/2015   Yes Historical Provider, MD  PREVACID SOLUTAB 30 MG disintegrating tablet FOR DIRECTIONS ON HOW TO   TAKE THIS MEDICINE, READ   THE ENCLOSED MEDICATION    INFORMATION FORM 11/11/16  Yes Wardell Honour, MD  rosuvastatin (CRESTOR) 20 MG tablet Take 1 tablet (20 mg total) by mouth daily. 11/28/16  Yes Wardell Honour, MD  Suvorexant (BELSOMRA) 20 MG TABS Take 20 mg by mouth at bedtime. 11/03/16  Yes Wardell Honour, MD  SYNTHROID 88 MCG tablet TAKE 1 TABLET DAILY BEFORE BREAKFAST 12/28/16  Yes Philemon Kingdom, MD  traZODone (DESYREL) 50 MG tablet Take 1-2 tablets (50-100 mg total) by mouth at bedtime as needed for sleep. 06/24/16  Yes Chelle Jeffery, PA-C  valACYclovir (VALTREX) 500 MG tablet TAKE ONE TABLET BY MOUTH TWICE DAILY 08/03/16  Yes Anastasio Auerbach, MD  vitamin B-12 (CYANOCOBALAMIN) 1000 MCG tablet Take 1,000 mcg by mouth  daily.     Yes Historical Provider, MD  VIVELLE-DOT 0.075 MG/24HR APPLY ONE PATCH EXTERNALLY TWICE WEEKLY 11/02/16  Yes Huel Cote, NP     Allergies  Allergen Reactions  . Clarithromycin Anaphylaxis  . Lunesta [Eszopiclone] Other (See Comments)    Causes sleep walking events  . Nutritional Supplements Anaphylaxis  . Pneumococcal Vaccines Anaphylaxis  . Sulfamethoxazole-Trimethoprim Anaphylaxis  . Tetanus Toxoids Swelling  . Wellbutrin [Bupropion Hcl] Other (See Comments)    seizure  . Zolpidem     Causes sleep walking events  . Bactrim Hives and Other (See Comments)    Severe headache  . Clarithromycin Itching and Other (See Comments)     Severe headache  . Doxycycline Other (See Comments)    Severe GI upset  . Hydone [Chlorthalidone] Other (See Comments)    Vasculitis   . Ranitidine Other (See Comments)    Acts like a diuretic   . Reglan [Metoclopramide] Other (See Comments)    Tremors and ticks, possible permanence of effects  . Sulfa Antibiotics Hives and Other (See Comments)    Severe headache  . Tetracyclines & Related Other (See Comments)    Severe GI upset       Objective:  Physical Exam         Assessment & Plan:   ***

## 2017-01-04 NOTE — Progress Notes (Signed)
Patient ID: Michelle [name], female    DOB: 01/23/1961, 56 y.o.   MRN: [id number]  PCP: Reginia Forts, MD  Chief Complaint  Patient presents with  . Sinus Problem    visited [place]    Subjective:   Presents for evaluation of presumed sinusitis, following a 2 week trip to [place] to pick up the 57 year-old girl she and her husband have just adopted.  Cough, runny nose, congestion x 10 days. Sinus pressure/pain, L>>R. Headache on LEFT x 3-4 days. LEFT ear pain/pressure. Used her albuterol rescue inhaler repeatedly while in [place]. Also used and OTC decongestant product that was very drying.  No fever, chills, SOB, CP, GI/GU symptoms. Her husband and new daughter have similar symptoms.  Has been taking OTC ibuprofen and Flonase .    Review of Systems As above.    Patient Active Problem List   Diagnosis Date Noted  . Insomnia 08/25/2016  . Abnormal brain MRI 11/07/2015  . Cough 04/02/2013  . Abnormal CXR 04/02/2013  . Leukocytosis 10/18/2012  . History of anaphylaxis 03/01/2012  . Depression 10/30/2011  . H/O suicide attempt 10/30/2011  . Hx of thyroid cancer   . Hypothyroidism   . Hypertension   . Asthma   . Gastroparesis   . Hypercholesteremia   . VAIN (vaginal intraepithelial neoplasia)   . GERD (gastroesophageal reflux disease) 07/16/2011     Prior to Admission medications   Medication Sig Start Date End Date Taking? Authorizing Provider  acyclovir ointment (ZOVIRAX) 5 % APPLY ONE APPLICATION TOPICALLY EVERY THREE HOURS 12/14/16  Yes Mozell Hardacre, PA-C  albuterol (VENTOLIN HFA) 108 (90 Base) MCG/ACT inhaler INHALE TWO PUFFS BY MOUTH EVERY FOUR HOURS AS NEEDED FOR WHEEZING, SHORTNESS OF BREATH OR COUGH 07/18/16  Yes Wardell Honour, MD  cetirizine (ZYRTEC) 10 MG tablet TAKE ONE TABLET BY MOUTH AT BEDTIME 01/12/16  Yes Bennett Scrape V, PA-C  chlorpheniramine-HYDROcodone (TUSSIONEX PENNKINETIC ER) 10-8 MG/5ML SUER Take 5 mLs by mouth every 12 (twelve) hours as needed  for cough. 07/18/16  Yes Wardell Honour, MD  EPINEPHrine 0.3 mg/0.3 mL IJ SOAJ injection Inject 0.3 mLs (0.3 mg total) into the muscle once. 10/24/14  Yes Darlyne Russian, MD  estradiol (VIVELLE-DOT) 0.075 MG/24HR APPLY 1 PATCH ONTO THE SKIN2 TIMES A WEEK 10/29/16  Yes Anastasio Auerbach, MD  ezetimibe (ZETIA) 10 MG tablet TAKE 1 TABLET DAILY 11/18/16  Yes Wardell Honour, MD  metoprolol succinate (TOPROL-XL) 25 MG 24 hr tablet TAKE 1 TABLET EVERY        MORNING, IF BLOOD PRESSURE STILL ELEVATED TAKE 1      TABLET IN AFTERNOON 08/30/16  Yes Antoinetta Berrones, PA-C  MINIVELLE 0.05 MG/24HR patch APPLY ONE PATCH EXTERNALLY TWICE WEEKLY. 08/11/16  Yes Anastasio Auerbach, MD  ondansetron (ZOFRAN ODT) 4 MG disintegrating tablet Take 1 tablet (4 mg total) by mouth every 8 (eight) hours as needed for nausea or vomiting. 11/03/16  Yes Wardell Honour, MD  PRESCRIPTION MEDICATION Place 1 drop into both eyes every morning. Reported on 12/08/2015   Yes Historical Provider, MD  PREVACID SOLUTAB 30 MG disintegrating tablet FOR DIRECTIONS ON HOW TO   TAKE THIS MEDICINE, READ   THE ENCLOSED MEDICATION    INFORMATION FORM 11/11/16  Yes Wardell Honour, MD  rosuvastatin (CRESTOR) 20 MG tablet Take 1 tablet (20 mg total) by mouth daily. 11/28/16  Yes Wardell Honour, MD  Suvorexant (BELSOMRA) 20 MG TABS Take 20 mg by  mouth at bedtime. 11/03/16  Yes Wardell Honour, MD  SYNTHROID 88 MCG tablet TAKE 1 TABLET DAILY BEFORE BREAKFAST 12/28/16  Yes Philemon Kingdom, MD  traZODone (DESYREL) 50 MG tablet Take 1-2 tablets (50-100 mg total) by mouth at bedtime as needed for sleep. 06/24/16  Yes Destini Cambre, PA-C  valACYclovir (VALTREX) 500 MG tablet TAKE ONE TABLET BY MOUTH TWICE DAILY 08/03/16  Yes Anastasio Auerbach, MD  vitamin B-12 (CYANOCOBALAMIN) 1000 MCG tablet Take 1,000 mcg by mouth daily.     Yes Historical Provider, MD  VIVELLE-DOT 0.075 MG/24HR APPLY ONE PATCH EXTERNALLY TWICE WEEKLY 11/02/16  Yes Huel Cote, NP     Allergies    Allergen Reactions  . Clarithromycin Anaphylaxis  . Lunesta [Eszopiclone] Other (See Comments)    Causes sleep walking events  . Nutritional Supplements Anaphylaxis  . Pneumococcal Vaccines Anaphylaxis  . Sulfamethoxazole-Trimethoprim Anaphylaxis  . Tetanus Toxoids Swelling  . Wellbutrin [Bupropion Hcl] Other (See Comments)    seizure  . Zolpidem     Causes sleep walking events  . Bactrim Hives and Other (See Comments)    Severe headache  . Clarithromycin Itching and Other (See Comments)    Severe headache  . Doxycycline Other (See Comments)    Severe GI upset  . Hydone [Chlorthalidone] Other (See Comments)    Vasculitis   . Ranitidine Other (See Comments)    Acts like a diuretic   . Reglan [Metoclopramide] Other (See Comments)    Tremors and ticks, possible permanence of effects  . Sulfa Antibiotics Hives and Other (See Comments)    Severe headache  . Tetracyclines & Related Other (See Comments)    Severe GI upset       Objective:  Physical Exam  Constitutional: She is oriented to person, place, and time. She appears well-developed and well-nourished. She is active and cooperative. No distress.  BP 118/76 (Cuff Size: Normal)   Pulse 94   Temp 98.1 F (36.7 C) (Oral)   Resp 18   LMP 05/23/2006   SpO2 97%   HENT:  Head: Normocephalic and atraumatic.  Right Ear: Hearing, tympanic membrane, external ear and ear canal normal.  Left Ear: Hearing, tympanic membrane, external ear and ear canal normal.  Nose: Mucosal edema present. No rhinorrhea. Right sinus exhibits maxillary sinus tenderness and frontal sinus tenderness. Left sinus exhibits maxillary sinus tenderness and frontal sinus tenderness.  Mouth/Throat: Uvula is midline and mucous membranes are normal. Posterior oropharyngeal erythema (mild) present.  Eyes: Conjunctivae are normal. No scleral icterus.  Neck: Normal range of motion. Neck supple. No thyromegaly present.  Cardiovascular: Normal rate, regular rhythm  and normal heart sounds.   Pulses:      Radial pulses are 2+ on the right side, and 2+ on the left side.  Pulmonary/Chest: Effort normal and breath sounds normal.  Lymphadenopathy:       Head (right side): No tonsillar, no preauricular, no posterior auricular and no occipital adenopathy present.       Head (left side): No tonsillar, no preauricular, no posterior auricular and no occipital adenopathy present.    She has no cervical adenopathy.       Right: No supraclavicular adenopathy present.       Left: No supraclavicular adenopathy present.  Neurological: She is alert and oriented to person, place, and time. No sensory deficit.  Skin: Skin is warm, dry and intact. No rash noted. No cyanosis or erythema. Nails show no clubbing.  Psychiatric: She has a normal  mood and affect. Her speech is normal and behavior is normal.           Assessment & Plan:   1. Acute non-recurrent maxillary sinusitis Supportive care.  Anticipatory guidance.  RTC if symptoms worsen/persist. - Azelastine HCl 0.15 % SOLN; Place 2 sprays into both nostrils 2 (two) times daily.  Dispense: 30 mL; Refill: 0 - amoxicillin-clavulanate (AUGMENTIN) 875-125 MG tablet; Take 1 tablet by mouth 2 (two) times daily.  Dispense: 20 tablet; Refill: 0 - chlorpheniramine-HYDROcodone (TUSSIONEX PENNKINETIC ER) 10-8 MG/5ML SUER; Take 5 mLs by mouth every 12 (twelve) hours as needed for cough.  Dispense: 100 mL; Refill: 0   Fara Chute, PA-C Physician Assistant-Certified [facility]

## 2017-01-04 NOTE — Patient Instructions (Addendum)
Drink plenty of water and get plenty of rest.     IF you received an x-ray today, you will receive an invoice from [facility]. Please contact [facility] at [phone] with questions or concerns regarding your invoice.   IF you received labwork today, you will receive an invoice from [facility]. Please contact [facility] at [phone] with questions or concerns regarding your invoice.   Our billing staff will not be able to assist you with questions regarding bills from these companies.  You will be contacted with the lab results as soon as they are available. The fastest way to get your results is to activate your My Chart account. Instructions are located on the last page of this paperwork. If you have not heard from [place] regarding the results in 2 weeks, please contact this office.

## 2017-01-25 ENCOUNTER — Telehealth: Payer: Self-pay | Admitting: Family Medicine

## 2017-01-25 NOTE — Telephone Encounter (Signed)
Please place referral or advise  

## 2017-01-25 NOTE — Telephone Encounter (Signed)
Pt is needing a referral to a pediatrican and to pt ot and speech for the little girl they adopted from [place] is that possible or does she need to bring her in first   Best number [phone]

## 2017-01-26 NOTE — Telephone Encounter (Signed)
We have several really great pediatricians in town.  Adopted child does not need a referral to a pediatrician.  Pt can call to establish child with a pediatrician in town. Once adopted child has established with a pediatrician in town, the child will be referred to the appropriate specialists, therapies.  We do not provide primary care to children less than 57 years old.  Additionally, I would highly recommend that patient establish adopted child with a pediatrician in town who is accustomed to providing primary care to an adopted child with specialty needs.

## 2017-01-28 NOTE — Telephone Encounter (Signed)
Got in to [facility] pediatrics.  She has referrals from service but wants to know if we know of local OT for children name?  Also asking for eczema cream for herself (dr daub used to rx)

## 2017-01-29 MED ORDER — TRIAMCINOLONE ACETONIDE 0.1 % EX CREA: 1.0000 "application " | TOPICAL_CREAM | Freq: Two times a day (BID) | CUTANEOUS | 0 refills | Status: DC

## 2017-01-29 NOTE — Telephone Encounter (Signed)
Unfortunately, I do not know any pediatric occupational therapist. The pediatrician at [facility] will likely have a recommendation. I have sent in Tri[facility]cin[facility]one to [facility] for eczema.

## 2017-02-01 NOTE — Telephone Encounter (Signed)
Mom advised, she found OT for kids

## 2017-02-04 ENCOUNTER — Ambulatory Visit: Payer: BLUE CROSS/BLUE SHIELD

## 2017-02-05 ENCOUNTER — Other Ambulatory Visit: Payer: Self-pay | Admitting: Physician Assistant

## 2017-02-05 DIAGNOSIS — J0100 Acute maxillary sinusitis, unspecified: Secondary | ICD-10-CM

## 2017-02-12 ENCOUNTER — Encounter: Payer: BLUE CROSS/BLUE SHIELD | Admitting: Gynecology

## 2017-03-04 ENCOUNTER — Telehealth: Payer: Self-pay | Admitting: Family Medicine

## 2017-03-04 NOTE — Telephone Encounter (Signed)
Pt wanted to know if she could have Ambien called in so she can get some sleep. Best pt callback number is [phone].

## 2017-03-05 NOTE — Telephone Encounter (Signed)
I see belsomra and trazadone on list, not [place]

## 2017-03-08 NOTE — Telephone Encounter (Signed)
Request for Ambien denied.  I do not see that pt has been prescribed Ambien in the past several years.  I do not recommend Ambien as a sleep aide for pt.

## 2017-03-08 NOTE — Telephone Encounter (Signed)
Pt advised l/m

## 2017-03-17 ENCOUNTER — Other Ambulatory Visit: Payer: Self-pay | Admitting: Internal Medicine

## 2017-03-17 NOTE — Telephone Encounter (Signed)
Last seen 11/2015. No other appointment made, please advise if okay to refill. Thank you!

## 2017-03-17 NOTE — Telephone Encounter (Signed)
No, she was lost for f/u 

## 2017-04-05 ENCOUNTER — Telehealth: Payer: Self-pay | Admitting: Family Medicine

## 2017-04-05 MED ORDER — SYNTHROID 88 MCG PO TABS: 88.0000 ug | ORAL_TABLET | Freq: Every day | ORAL | 0 refills | Status: DC

## 2017-04-05 NOTE — Telephone Encounter (Signed)
PT NEEDING A REFILL ON SYNTHROID PLEASE SEND IT TO [facility] [address]

## 2017-04-21 ENCOUNTER — Other Ambulatory Visit: Payer: Self-pay | Admitting: Family Medicine

## 2017-05-11 ENCOUNTER — Other Ambulatory Visit: Payer: Self-pay | Admitting: Physician Assistant

## 2017-05-11 DIAGNOSIS — G4700 Insomnia, unspecified: Secondary | ICD-10-CM

## 2017-05-11 NOTE — Telephone Encounter (Signed)
Clinical staff --- please call in [place] as approved.  Clerical staff ---please call pt; she is due for six month follow-up for fasting labs and follow-up; please schedule follow-up OV with me.

## 2017-05-12 NOTE — Telephone Encounter (Signed)
Called patient and left a message that medication was faxed to pharmacy and that she needed to call back and make a six month follow up appt with Dr. Tamala Julian

## 2017-05-12 NOTE — Telephone Encounter (Signed)
Rx called into pharmacy on file

## 2017-05-18 ENCOUNTER — Ambulatory Visit (INDEPENDENT_AMBULATORY_CARE_PROVIDER_SITE_OTHER): Payer: BLUE CROSS/BLUE SHIELD | Admitting: Family Medicine

## 2017-05-18 ENCOUNTER — Encounter: Payer: Self-pay | Admitting: Family Medicine

## 2017-05-18 VITALS — HR 75 | Temp 98.0°F | Resp 18 | Ht 64.0 in

## 2017-05-18 DIAGNOSIS — L298 Other pruritus: Secondary | ICD-10-CM | POA: Diagnosis not present

## 2017-05-18 DIAGNOSIS — F5101 Primary insomnia: Secondary | ICD-10-CM

## 2017-05-18 DIAGNOSIS — I10 Essential (primary) hypertension: Secondary | ICD-10-CM

## 2017-05-18 DIAGNOSIS — J4540 Moderate persistent asthma, uncomplicated: Secondary | ICD-10-CM | POA: Diagnosis not present

## 2017-05-18 DIAGNOSIS — E890 Postprocedural hypothyroidism: Secondary | ICD-10-CM

## 2017-05-18 DIAGNOSIS — N898 Other specified noninflammatory disorders of vagina: Secondary | ICD-10-CM

## 2017-05-18 DIAGNOSIS — Z9151 Personal history of suicidal behavior: Secondary | ICD-10-CM

## 2017-05-18 DIAGNOSIS — D72825 Bandemia: Secondary | ICD-10-CM | POA: Diagnosis not present

## 2017-05-18 DIAGNOSIS — F5104 Psychophysiologic insomnia: Secondary | ICD-10-CM

## 2017-05-18 DIAGNOSIS — E7800 Pure hypercholesterolemia, unspecified: Secondary | ICD-10-CM

## 2017-05-18 DIAGNOSIS — Z915 Personal history of self-harm: Secondary | ICD-10-CM

## 2017-05-18 DIAGNOSIS — Z85850 Personal history of malignant neoplasm of thyroid: Secondary | ICD-10-CM | POA: Diagnosis not present

## 2017-05-18 MED ORDER — ROSUVASTATIN CALCIUM 20 MG PO TABS: 20.0000 mg | ORAL_TABLET | Freq: Every day | ORAL | 1 refills | Status: DC

## 2017-05-18 MED ORDER — FLUCONAZOLE 150 MG PO TABS: 150.0000 mg | ORAL_TABLET | Freq: Once | ORAL | 0 refills | Status: AC

## 2017-05-18 MED ORDER — SYNTHROID 88 MCG PO TABS: 88.0000 ug | ORAL_TABLET | Freq: Every day | ORAL | 3 refills | Status: DC

## 2017-05-18 MED ORDER — TRAZODONE HCL 50 MG PO TABS: 50.0000 mg | ORAL_TABLET | Freq: Every evening | ORAL | 3 refills | Status: DC | PRN

## 2017-05-18 NOTE — Progress Notes (Signed)
Subjective:    Patient ID: Michelle [name], female    DOB: 01/28/1961, 57 y.o.   MRN: [id number]  05/18/2017  Hypothyroidism; Hyperlipidemia; and Insomnia   HPI This 56 y.o. female presents for evaluation of hypercholesterolemia, hypothyroidism, insomnia, anxiety.  Patient reports good compliance with medication, good tolerance to medication, and good symptom control.  Not sleeping due to adopting specialty needs Chinese girl.  Went to bed at midnight and wok up at 10:45 this am.  Belsomra helpful but does not keep patient asleep.    Should be going to school in the fall 2018. Suggested [employer] yet not ready for school.  Requesting Diflucan; recent abx; having vaginal itching.   BP Readings from Last 3 Encounters:  01/04/17 118/76  11/03/16 136/84  08/25/16 132/78   Immunization History  Administered Date(s) Administered  . Influenza Split 08/24/2015  . Influenza-Unspecified 08/15/2016  . PPD Test 12/15/2015     Review of Systems  Constitutional: Negative for chills, diaphoresis, fatigue and fever.  Eyes: Negative for visual disturbance.  Respiratory: Negative for cough and shortness of breath.   Cardiovascular: Negative for chest pain, palpitations and leg swelling.  Gastrointestinal: Negative for abdominal pain, constipation, diarrhea, nausea and vomiting.  Endocrine: Negative for cold intolerance, heat intolerance, polydipsia, polyphagia and polyuria.  Neurological: Negative for dizziness, tremors, seizures, syncope, facial asymmetry, speech difficulty, weakness, light-headedness, numbness and headaches.  Psychiatric/Behavioral: Positive for sleep disturbance. Negative for dysphoric mood. The patient is nervous/anxious.     Past Medical History:  Diagnosis Date  . Allergy   . Anaphylaxis   . Asthma   . Depression   . Gastroparesis   . Gastroparesis    history of  . GERD (gastroesophageal reflux disease)   . Headache(784.0)    migraines  . Hypercholesteremia     . Hypothyroidism   . Osteoarthritis   . Osteopenia 06/2013   T score -1.2 FRAX 8.6%/0.6%  . Overdose, drug 10/30/2011  . PONV (postoperative nausea and vomiting)   . Reflux   . Seizures ([place])    1 seizure secondary to wellbutrin-none since  . Thyroid cancer ([place])    papillary  . VAIN I (vaginal intraepithelial neoplasia grade I) 09/2013, 11/2014    with positive high-risk HPV screen    Past Surgical History:  Procedure Laterality Date  . BREAST SURGERY     reduc tion mammoplasty  . CHOLECYSTECTOMY  2004  . COLPOSCOPY  02/2012   VAIN I  . COSMETIC SURGERY     buttock liposuction  . HERNIA REPAIR  2012  . NISSEN FUNDOPLICATION  6568, 1275   REFLUX  . THYROID SURGERY  2007   for thyroid cancer  . UPPER GASTROINTESTINAL ENDOSCOPY  08/26/2004  . VAGINAL HYSTERECTOMY  2007   irregular bleeding  . VARICOSE VEIN SURGERY  03/20/2003   right leg   Allergies  Allergen Reactions  . Clarithromycin Anaphylaxis  . Lunesta [Eszopiclone] Other (See Comments)    Causes sleep walking events  . Nutritional Supplements Anaphylaxis  . Pneumococcal Vaccines Anaphylaxis  . Sulfamethoxazole-Trimethoprim Anaphylaxis  . Tetanus Toxoids Swelling  . Wellbutrin [Bupropion Hcl] Other (See Comments)    seizure  . Zolpidem     Causes sleep walking events  . Bactrim Hives and Other (See Comments)    Severe headache  . Clarithromycin Itching and Other (See Comments)    Severe headache  . Doxycycline Other (See Comments)    Severe GI upset  . Hydone [Chlorthalidone] Other (See Comments)  Vasculitis   . Ranitidine Other (See Comments)    Acts like a diuretic   . Reglan [Metoclopramide] Other (See Comments)    Tremors and ticks, possible permanence of effects  . Sulfa Antibiotics Hives and Other (See Comments)    Severe headache  . Tetracyclines & Related Other (See Comments)    Severe GI upset    Social History   Social History  . Marital status: Married    Spouse name: [name]  .  Number of children: 1  . Years of education: College   Occupational History  . homemaker    Social History Main Topics  . Smoking status: Never Smoker  . Smokeless tobacco: Never Used  . Alcohol use No  . Drug use: No  . Sexual activity: Yes    Partners: Male    Birth control/ protection: Surgical     Comment: 1st intercourse 22 yo-1 partner-HYST   Other Topics Concern  . Not on file   Social History Narrative   Exercise: Yes.   Lives with her husband. History of marital discord.   Receiving therapy with Vivia Budge.   Son lives locally.   Adopted daughter from [place] 11/2016.   Family History  Problem Relation Age of Onset  . Heart disease Mother   . Hypertension Mother   . Heart disease Father   . Hypertension Father   . Emphysema Father 60       was a smoker  . Heart disease Sister   . Hypertension Sister   . Diabetes Paternal Grandmother   . Multiple sclerosis Other   . Cancer Maternal Grandmother   . Heart disease Brother   . Leukemia Maternal Grandfather   . Cancer Maternal Aunt        leukemia       Objective:    Pulse 75   Temp 98 F (36.7 C) (Oral)   Resp 18   Ht 5\' 4"  (1.626 m)   LMP 05/23/2006   SpO2 98%  Physical Exam  Constitutional: She is oriented to person, place, and time. She appears well-developed and well-nourished. No distress.  HENT:  Head: Normocephalic and atraumatic.  Right Ear: External ear normal.  Left Ear: External ear normal.  Nose: Nose normal.  Mouth/Throat: Oropharynx is clear and moist.  Eyes: Conjunctivae and EOM are normal. Pupils are equal, round, and reactive to light.  Neck: Normal range of motion. Neck supple. Carotid bruit is not present. No thyromegaly present.  Cardiovascular: Normal rate, regular rhythm, normal heart sounds and intact distal pulses.  Exam reveals no gallop and no friction rub.   No murmur heard. Pulmonary/Chest: Effort normal and breath sounds normal. She has no wheezes. She has no rales.    Abdominal: Soft. Bowel sounds are normal. She exhibits no distension and no mass. There is no tenderness. There is no rebound and no guarding.  Lymphadenopathy:    She has no cervical adenopathy.  Neurological: She is alert and oriented to person, place, and time. No cranial nerve deficit.  Skin: Skin is warm and dry. No rash noted. She is not diaphoretic. No erythema. No pallor.  Psychiatric: She has a normal mood and affect. Her behavior is normal.   Depression screen [facility] 2/9 05/18/2017 11/03/2016 08/25/2016 07/18/2016 06/24/2016  Decreased Interest 0 0 0 0 0  Down, Depressed, Hopeless 0 - 0 0 0  PHQ - 2 Score 0 0 0 0 0  Altered sleeping - - - - -  Tired, decreased  energy - - - - -  Change in appetite - - - - -  Feeling bad or failure about yourself  - - - - -  Trouble concentrating - - - - -  Moving slowly or fidgety/restless - - - - -  Suicidal thoughts - - - - -  PHQ-9 Score - - - - -  Some recent data might be hidden        Assessment & Plan:   1. Postoperative hypothyroidism   2. Hypercholesteremia   3. Bandemia   4. Primary insomnia   5. Hx of thyroid cancer   6. Psychophysiological insomnia   7. Essential hypertension   8. Moderate persistent asthma without complication   9. H/O suicide attempt   10. Vaginal itching    -controlled hypertension and hypothyroidism post-surgical and hypercholesterolemia.  Obtain labs; continue current medications. -suffering with insomnia despite Belsomra; refill Trazodone to replace Belsomra or to take in addition to.   -Emotionally stable despite recent adoption of developmentally delayed chinese 9 year old daughter. -recent abx; Diflucan provided for candidiasis vulvovaginal.  Orders Placed This Encounter  Procedures  . CBC with Differential/Platelet  . Comprehensive metabolic panel    Order Specific Question:   Has the patient fasted?    Answer:   Yes  . Lipid panel    Order Specific Question:   Has the patient fasted?     Answer:   Yes  . TSH  . T4, free  . Please Note   Meds ordered this encounter  Medications  . traZODone (DESYREL) 50 MG tablet    Sig: Take 1-2 tablets (50-100 mg total) by mouth at bedtime as needed for sleep.    Dispense:  60 tablet    Refill:  3  . SYNTHROID 88 MCG tablet    Sig: Take 1 tablet (88 mcg total) by mouth daily before breakfast.    Dispense:  90 tablet    Refill:  3  . fluconazole (DIFLUCAN) 150 MG tablet    Sig: Take 1 tablet (150 mg total) by mouth once. Repeat if needed    Dispense:  2 tablet    Refill:  0  . rosuvastatin (CRESTOR) 20 MG tablet    Sig: Take 1 tablet (20 mg total) by mouth daily.    Dispense:  90 tablet    Refill:  1    Return in about 6 months (around 11/17/2017) for complete physical examiniation.   Kristi Elayne Guerin, M.D. Primary Care at [facility] previously [facility] [place] [address] [place], [place]  [address] [phone] phone [phone] fax

## 2017-05-18 NOTE — Patient Instructions (Signed)
     IF you received an x-ray today, you will receive an invoice from [facility]. Please contact [facility] at [phone] with questions or concerns regarding your invoice.   IF you received labwork today, you will receive an invoice from [facility]. Please contact [facility] at [phone] with questions or concerns regarding your invoice.   Our billing staff will not be able to assist you with questions regarding bills from these companies.  You will be contacted with the lab results as soon as they are available. The fastest way to get your results is to activate your My Chart account. Instructions are located on the last page of this paperwork. If you have not heard from us regarding the results in 2 weeks, please contact this office.     

## 2017-05-19 LAB — CBC WITH DIFFERENTIAL/PLATELET
Basophils Absolute: 0.1 10*3/uL (ref 0.0–0.2)
Basos: 1 %
EOS (ABSOLUTE): 0.3 10*3/uL (ref 0.0–0.4)
Eos: 3 %
Hematocrit: 43.1 % (ref 34.0–46.6)
Hemoglobin: 15.2 g/dL (ref 11.1–15.9)
IMMATURE GRANS (ABS): 0 10*3/uL (ref 0.0–0.1)
IMMATURE GRANULOCYTES: 0 %
LYMPHS: 35 %
Lymphocytes Absolute: 4.3 10*3/uL — ABNORMAL HIGH (ref 0.7–3.1)
MCH: 31.3 pg (ref 26.6–33.0)
MCHC: 35.3 g/dL (ref 31.5–35.7)
MCV: 89 fL (ref 79–97)
MONOS ABS: 0.9 10*3/uL (ref 0.1–0.9)
Monocytes: 7 %
NEUTROS PCT: 54 %
Neutrophils Absolute: 6.9 10*3/uL (ref 1.4–7.0)
PLATELETS: 266 10*3/uL (ref 150–379)
RBC: 4.85 x10E6/uL (ref 3.77–5.28)
RDW: 13.8 % (ref 12.3–15.4)
WBC: 12.5 10*3/uL — ABNORMAL HIGH (ref 3.4–10.8)

## 2017-05-19 LAB — COMPREHENSIVE METABOLIC PANEL
A/G RATIO: 2 (ref 1.2–2.2)
ALT: 33 IU/L — AB (ref 0–32)
AST: 40 IU/L (ref 0–40)
Albumin: 4.9 g/dL (ref 3.5–5.5)
Alkaline Phosphatase: 65 IU/L (ref 39–117)
BUN/Creatinine Ratio: 12 (ref 9–23)
BUN: 9 mg/dL (ref 6–24)
Bilirubin Total: 0.7 mg/dL (ref 0.0–1.2)
CALCIUM: 9.8 mg/dL (ref 8.7–10.2)
CO2: 26 mmol/L (ref 20–29)
CREATININE: 0.76 mg/dL (ref 0.57–1.00)
Chloride: 102 mmol/L (ref 96–106)
GFR calc Af Amer: 101 mL/min/{1.73_m2} (ref >59)
GFR, EST NON AFRICAN AMERICAN: 88 mL/min/{1.73_m2} (ref >59)
Globulin, Total: 2.4 g/dL (ref 1.5–4.5)
Glucose: 89 mg/dL (ref 65–99)
POTASSIUM: 4.1 mmol/L (ref 3.5–5.2)
Sodium: 142 mmol/L (ref 134–144)
TOTAL PROTEIN: 7.3 g/dL (ref 6.0–8.5)

## 2017-05-19 LAB — PLEASE NOTE

## 2017-05-19 LAB — T4, FREE: Free T4: 1.13 ng/dL (ref 0.82–1.77)

## 2017-05-19 LAB — LIPID PANEL
CHOL/HDL RATIO: 3.6 ratio (ref 0.0–4.4)
Cholesterol, Total: 214 mg/dL — ABNORMAL HIGH (ref 100–199)
HDL: 59 mg/dL (ref >39)
LDL Calculated: 105 mg/dL — ABNORMAL HIGH (ref 0–99)
Triglycerides: 250 mg/dL — ABNORMAL HIGH (ref 0–149)
VLDL CHOLESTEROL CAL: 50 mg/dL — AB (ref 5–40)

## 2017-05-19 LAB — TSH: TSH: 0.954 u[IU]/mL (ref 0.450–4.500)

## 2017-05-22 ENCOUNTER — Encounter: Payer: Self-pay | Admitting: Family Medicine

## 2017-05-24 NOTE — Progress Notes (Signed)
Letter sent.

## 2017-06-07 ENCOUNTER — Other Ambulatory Visit: Payer: Self-pay | Admitting: Family Medicine

## 2017-06-15 ENCOUNTER — Telehealth: Payer: Self-pay | Admitting: Family Medicine

## 2017-06-15 NOTE — Telephone Encounter (Signed)
PATIENT WOULD LIKE THIS MESSAGE TO GO TO DR. Tamala Julian: SHE NEEDS TO HAVE HER PREVACID SOLUTAB 30 MG RESENT INTO HER PHARMACY. SHE SAID SHE CAN NOT TAKE THE GENERIC BRAND. SHE SAID DR. Tamala Julian NEEDS TO SAY IT WAS SENT IN ERROR THE FIRST TIME SO SHE WILL NOT HAVE TO PAY ANOTHER $90.00. SHE WANTS IT TO ALSO SAY "DISPENSE AS WRITTEN" SO SHE WILL GET THE BRAND NAME ONLY. BEST PHONE [phone] (CELL) PHARMACY CHOICE IS [facility] [place]

## 2017-06-17 ENCOUNTER — Other Ambulatory Visit: Payer: Self-pay

## 2017-06-18 ENCOUNTER — Telehealth: Payer: Self-pay | Admitting: Family Medicine

## 2017-06-19 MED ORDER — LANSOPRAZOLE 30 MG PO TBDP: ORAL_TABLET | ORAL | 3 refills | Status: DC

## 2017-06-24 ENCOUNTER — Other Ambulatory Visit: Payer: Self-pay | Admitting: Family Medicine

## 2017-06-24 DIAGNOSIS — G4700 Insomnia, unspecified: Secondary | ICD-10-CM

## 2017-06-25 ENCOUNTER — Other Ambulatory Visit: Payer: Self-pay

## 2017-06-25 MED ORDER — PREVACID SOLUTAB 30 MG PO TBDP: ORAL_TABLET | ORAL | 3 refills | Status: DC

## 2017-06-26 NOTE — Telephone Encounter (Signed)
Okay to refill? Last seen in June and a 30 day supply with no refills were sent in at that time.

## 2017-06-26 NOTE — Telephone Encounter (Signed)
Please call in [place] as approved.

## 2017-06-30 ENCOUNTER — Other Ambulatory Visit: Payer: Self-pay | Admitting: Family Medicine

## 2017-07-01 NOTE — Telephone Encounter (Signed)
Please advise 

## 2017-07-01 NOTE — Telephone Encounter (Signed)
PATIENT WANTS THIS MESSAGE TO GO TO DR. Tamala Julian ONLY BECAUSE IT CAN NOT SEEM TO BE DONE CORRECTLY PER THE PATIENT. IT IS REGARDING THE MESSAGE TAKEN ON 06/15/17. SHE ALSO NEEDS HER EZETIMIBE (ZETIA) 10 MG.  BEST PHONE [phone] (CELL) [place]

## 2017-07-05 MED ORDER — PREVACID SOLUTAB 30 MG PO TBDP: 30.0000 mg | ORAL_TABLET | Freq: Two times a day (BID) | ORAL | 3 refills | Status: DC

## 2017-07-05 NOTE — Telephone Encounter (Signed)
Left message with detailed message Advised to call office if any questions or concerns.

## 2017-07-05 NOTE — Telephone Encounter (Signed)
Prevacid Solutab Brand Name sent to [facility].  Please advise pt.  I also stated on rx that I sent first rx in error.

## 2017-07-07 ENCOUNTER — Other Ambulatory Visit: Payer: Self-pay | Admitting: Family Medicine

## 2017-07-09 ENCOUNTER — Encounter: Payer: Self-pay | Admitting: Family Medicine

## 2017-07-09 ENCOUNTER — Ambulatory Visit (INDEPENDENT_AMBULATORY_CARE_PROVIDER_SITE_OTHER): Payer: BLUE CROSS/BLUE SHIELD | Admitting: Family Medicine

## 2017-07-09 VITALS — BP 129/87 | HR 89 | Temp 97.7°F | Resp 18 | Ht 64.0 in

## 2017-07-09 DIAGNOSIS — I10 Essential (primary) hypertension: Secondary | ICD-10-CM | POA: Diagnosis not present

## 2017-07-09 DIAGNOSIS — E785 Hyperlipidemia, unspecified: Secondary | ICD-10-CM | POA: Diagnosis not present

## 2017-07-09 DIAGNOSIS — J0100 Acute maxillary sinusitis, unspecified: Secondary | ICD-10-CM | POA: Diagnosis not present

## 2017-07-09 DIAGNOSIS — Z114 Encounter for screening for human immunodeficiency virus [HIV]: Secondary | ICD-10-CM | POA: Diagnosis not present

## 2017-07-09 DIAGNOSIS — Z1159 Encounter for screening for other viral diseases: Secondary | ICD-10-CM

## 2017-07-09 DIAGNOSIS — E890 Postprocedural hypothyroidism: Secondary | ICD-10-CM | POA: Diagnosis not present

## 2017-07-09 DIAGNOSIS — R7612 Nonspecific reaction to cell mediated immunity measurement of gamma interferon antigen response without active tuberculosis: Secondary | ICD-10-CM | POA: Diagnosis not present

## 2017-07-09 DIAGNOSIS — Z111 Encounter for screening for respiratory tuberculosis: Secondary | ICD-10-CM | POA: Diagnosis not present

## 2017-07-09 DIAGNOSIS — Z0000 Encounter for general adult medical examination without abnormal findings: Secondary | ICD-10-CM | POA: Diagnosis not present

## 2017-07-09 DIAGNOSIS — Z131 Encounter for screening for diabetes mellitus: Secondary | ICD-10-CM

## 2017-07-09 DIAGNOSIS — Z1239 Encounter for other screening for malignant neoplasm of breast: Secondary | ICD-10-CM

## 2017-07-09 DIAGNOSIS — Z1231 Encounter for screening mammogram for malignant neoplasm of breast: Secondary | ICD-10-CM

## 2017-07-09 LAB — POCT URINALYSIS DIP (MANUAL ENTRY)
Bilirubin, UA: NEGATIVE
Glucose, UA: NEGATIVE mg/dL
Ketones, POC UA: NEGATIVE mg/dL
LEUKOCYTES UA: NEGATIVE
NITRITE UA: NEGATIVE
PH UA: 6.5 (ref 5.0–8.0)
PROTEIN UA: NEGATIVE mg/dL
Spec Grav, UA: 1.025 (ref 1.010–1.025)
UROBILINOGEN UA: 0.2 U/dL

## 2017-07-09 NOTE — Patient Instructions (Addendum)
We recommend that you schedule a mammogram for breast cancer screening. Typically, you do not need a referral to do this. Please contact a local imaging center to schedule your mammogram.  [facility] - [phone]  *ask for the Radiology Department [facility] - [phone] or [phone]  [facility] - [phone] [facility] - [phone] [facility] - [phone]  *ask for the Radiology Department [facility] - [phone]  *ask for the Radiology Department [facility] - [phone]  *ask for the Mammography Department [facility] - [phone]    IF you received an x-ray today, you will receive an invoice from [facility]. Please contact [facility] at [phone] with questions or concerns regarding your invoice.   IF you received labwork today, you will receive an invoice from [facility]. Please contact [facility] at [phone] with questions or concerns regarding your invoice.   Our billing staff will not be able to assist you with questions regarding bills from these companies.  You will be contacted with the lab results as soon as they are available. The fastest way to get your results is to activate your My Chart account. Instructions are located on the last page of this paperwork. If you have not heard from us regarding the results in 2 weeks, please contact this office.      Preventive Care 40-64 Years, Female Preventive care refers to lifestyle choices and visits with your health care provider that can promote health and wellness. What does preventive care include?  A yearly physical exam. This is also called an annual well check.  Dental exams once or twice a year.  Routine eye exams. Ask your health care provider how often you should have your eyes checked.  Personal lifestyle choices, including: ? Daily care of your teeth and  gums. ? Regular physical activity. ? Eating a healthy diet. ? Avoiding tobacco and drug use. ? Limiting alcohol use. ? Practicing safe sex. ? Taking low-dose aspirin daily starting at age 50. ? Taking vitamin and mineral supplements as recommended by your health care provider. What happens during an annual well check? The services and screenings done by your health care provider during your annual well check will depend on your age, overall health, lifestyle risk factors, and family history of disease. Counseling Your health care provider may ask you questions about your:  Alcohol use.  Tobacco use.  Drug use.  Emotional well-being.  Home and relationship well-being.  Sexual activity.  Eating habits.  Work and work environment.  Method of birth control.  Menstrual cycle.  Pregnancy history.  Screening You may have the following tests or measurements:  Height, weight, and BMI.  Blood pressure.  Lipid and cholesterol levels. These may be checked every 5 years, or more frequently if you are over 50 years old.  Skin check.  Lung cancer screening. You may have this screening every year starting at age 55 if you have a 30-pack-year history of smoking and currently smoke or have quit within the past 15 years.  Fecal occult blood test (FOBT) of the stool. You may have this test every year starting at age 50.  Flexible sigmoidoscopy or colonoscopy. You may have a sigmoidoscopy every 5 years or a colonoscopy every 10 years starting at age 50.  Hepatitis C blood test.  Hepatitis B blood test.  Sexually transmitted disease (STD) testing.  Diabetes screening. This is done by   by checking your blood sugar (glucose) after you have not eaten for a while (fasting). You may have this done every 1-3 years.  Mammogram. This may be done every 1-2 years. Talk to your health care provider about when you should start having regular mammograms. This may depend on whether you have a  family history of breast cancer.  BRCA-related cancer screening. This may be done if you have a family history of breast, ovarian, tubal, or peritoneal cancers.  Pelvic exam and Pap test. This may be done every 3 years starting at age 21. Starting at age 30, this may be done every 5 years if you have a Pap test in combination with an HPV test.  Bone density scan. This is done to screen for osteoporosis. You may have this scan if you are at high risk for osteoporosis. Discuss your test results, treatment options, and if necessary, the need for more tests with your health care provider. Vaccines  Your health care provider may recommend certain vaccines, such as:  Influenza vaccine. This is recommended every year.  Tetanus, diphtheria, and acellular pertussis (Tdap, Td) vaccine. You may need a Td booster every 10 years.  Varicella vaccine. You may need this if you have not been vaccinated.  Zoster vaccine. You may need this after age 60.  Measles, mumps, and rubella (MMR) vaccine. You may need at least one dose of MMR if you were born in 1957 or later. You may also need a second dose.  Pneumococcal 13-valent conjugate (PCV13) vaccine. You may need this if you have certain conditions and were not previously vaccinated.  Pneumococcal polysaccharide (PPSV23) vaccine. You may need one or two doses if you smoke cigarettes or if you have certain conditions.  Meningococcal vaccine. You may need this if you have certain conditions.  Hepatitis A vaccine. You may need this if you have certain conditions or if you travel or work in places where you may be exposed to hepatitis A.  Hepatitis B vaccine. You may need this if you have certain conditions or if you travel or work in places where you may be exposed to hepatitis B.  Haemophilus influenzae type b (Hib) vaccine. You may need this if you have certain conditions. Talk to your health care provider about which screenings and vaccines you need and  how often you need them. This information is not intended to replace advice given to you by your health care provider. Make sure you discuss any questions you have with your health care provider. Document Released: 12/06/2015 Document Revised: 07/29/2016 Document Reviewed: 09/10/2015 Elsevier Interactive Patient Education  2017 [employer]  

## 2017-07-09 NOTE — Progress Notes (Signed)
Subjective:    Patient ID: Michelle [name], female    DOB: Aug 07, 1961, 56 y.o.   MRN: [id number]  07/09/2017  Annual Exam (pt has forms that need to filled out for adoption and she needs a letter written up stating what meds she is taking and why.)   HPI This 56 y.o. female presents for Complete Physical Examination for completion of adoption forms.  Last physical: 12-15-2015 Pap smear:   02-12-16 [place] WNL  HPV negative; hysterectomy Mammogram: 01-2015; DUE! Colonoscopy:  2016 Bone density: none Eye exam:  Completed; new glasses; 2018 Dental exam:  Every six months.   Visual Acuity Screening   Right eye Left eye Both eyes  Without correction:     With correction: 20/20 20/20 20/20     BP Readings from Last 3 Encounters:  07/22/17 126/78  07/10/17 124/86  07/09/17 129/87   Wt Readings from Last 3 Encounters:  07/22/17 138 lb (62.6 kg)  07/18/16 137 lb (62.1 kg)  06/24/16 140 lb (63.5 kg)   Immunization History  Administered Date(s) Administered  . Influenza Split 08/24/2015  . Influenza-Unspecified 08/15/2016  . PPD Test 12/15/2015    Review of Systems  Constitutional: Negative for activity change, appetite change, chills, diaphoresis, fatigue, fever and unexpected weight change.  HENT: Negative for congestion, dental problem, drooling, ear discharge, ear pain, facial swelling, hearing loss, mouth sores, nosebleeds, postnasal drip, rhinorrhea, sinus pressure, sneezing, sore throat, tinnitus, trouble swallowing and voice change.   Eyes: Negative for photophobia, pain, discharge, redness, itching and visual disturbance.  Respiratory: Negative for apnea, cough, choking, chest tightness, shortness of breath, wheezing and stridor.   Cardiovascular: Negative for chest pain, palpitations and leg swelling.  Gastrointestinal: Positive for nausea. Negative for abdominal distention, abdominal pain, anal bleeding, blood in stool, constipation, diarrhea, rectal pain and vomiting.    Endocrine: Negative for cold intolerance, heat intolerance, polydipsia, polyphagia and polyuria.  Genitourinary: Negative for decreased urine volume, difficulty urinating, dyspareunia, dysuria, enuresis, flank pain, frequency, genital sores, hematuria, menstrual problem, pelvic pain, urgency, vaginal bleeding, vaginal discharge and vaginal pain.       Nocturia x 0.  No urinary leakage.  Musculoskeletal: Negative for arthralgias, back pain, gait problem, joint swelling, myalgias, neck pain and neck stiffness.  Skin: Negative for color change, pallor, rash and wound.  Allergic/Immunologic: Negative for environmental allergies, food allergies and immunocompromised state.  Neurological: Negative for dizziness, tremors, seizures, syncope, facial asymmetry, speech difficulty, weakness, light-headedness, numbness and headaches.  Hematological: Negative for adenopathy. Does not bruise/bleed easily.  Psychiatric/Behavioral: Negative for agitation, behavioral problems, confusion, decreased concentration, dysphoric mood, hallucinations, self-injury, sleep disturbance and suicidal ideas. The patient is not nervous/anxious and is not hyperactive.        Bedtime 1000-1200; wakes up 04-799.      Past Medical History:  Diagnosis Date  . Allergy   . Anaphylaxis   . Asthma   . Depression   . Gastroparesis   . Gastroparesis    history of  . GERD (gastroesophageal reflux disease)   . Headache(784.0)    migraines  . Hypercholesteremia   . Hypothyroidism   . Osteoarthritis   . Osteopenia 06/2013   T score -1.2 FRAX 8.6%/0.6%  . Overdose, drug 10/30/2011  . PONV (postoperative nausea and vomiting)   . Reflux   . Seizures ([place])    1 seizure secondary to wellbutrin-none since  . Thyroid cancer ([place])    papillary  . VAIN I (vaginal intraepithelial neoplasia grade I) 09/2013, 11/2014  with positive high-risk HPV screen    Past Surgical History:  Procedure Laterality Date  . BREAST SURGERY     reduc  tion mammoplasty  . CHOLECYSTECTOMY  2004  . COLPOSCOPY  02/2012   VAIN I  . COSMETIC SURGERY     buttock liposuction  . HERNIA REPAIR  2012  . NISSEN FUNDOPLICATION  9767, 3419   REFLUX  . THYROID SURGERY  2007   for thyroid cancer  . UPPER GASTROINTESTINAL ENDOSCOPY  08/26/2004  . VAGINAL HYSTERECTOMY  2007   irregular bleeding  . VARICOSE VEIN SURGERY  03/20/2003   right leg   Allergies  Allergen Reactions  . Clarithromycin Anaphylaxis  . Lunesta [Eszopiclone] Other (See Comments)    Causes sleep walking events  . Nutritional Supplements Anaphylaxis  . Pneumococcal Vaccines Anaphylaxis  . Sulfamethoxazole-Trimethoprim Anaphylaxis  . Tetanus Toxoids Swelling  . Wellbutrin [Bupropion Hcl] Other (See Comments)    seizure  . Zolpidem     Causes sleep walking events  . Bactrim Hives and Other (See Comments)    Severe headache  . Clarithromycin Itching and Other (See Comments)    Severe headache  . Doxycycline Other (See Comments)    Severe GI upset  . Hydone [Chlorthalidone] Other (See Comments)    Vasculitis   . Ranitidine Other (See Comments)    Acts like a diuretic   . Reglan [Metoclopramide] Other (See Comments)    Tremors and ticks, possible permanence of effects  . Sulfa Antibiotics Hives and Other (See Comments)    Severe headache  . Tetracyclines & Related Other (See Comments)    Severe GI upset   Current Outpatient Prescriptions  Medication Sig Dispense Refill  . acyclovir ointment (ZOVIRAX) 5 % APPLY ONE APPLICATION TOPICALLY EVERY THREE HOURS 30 g 2  . albuterol (VENTOLIN HFA) 108 (90 Base) MCG/ACT inhaler INHALE TWO PUFFS BY MOUTH EVERY FOUR HOURS AS NEEDED FOR WHEEZING, SHORTNESS OF BREATH OR COUGH 18 Inhaler 1  . BELSOMRA 20 MG TABS TAKE 1 TABLET BY MOUTH AT BEDTIME 30 tablet 4  . EPINEPHrine 0.3 mg/0.3 mL IJ SOAJ injection Inject 0.3 mLs (0.3 mg total) into the muscle once. (Patient not taking: Reported on 07/22/2017) 2 Device 2  . ezetimibe (ZETIA) 10  MG tablet TAKE 1 TABLET DAILY 90 tablet 1  . metoprolol succinate (TOPROL-XL) 25 MG 24 hr tablet TAKE 1 TABLET EVERY MORNING, IF BLOOD PRESSURE STILL ELEVATED TAKE 1 TABLET IN AFTERNOON 180 tablet 2  . ondansetron (ZOFRAN-ODT) 4 MG disintegrating tablet Take 1 tablet (4 mg total) by mouth every 8 (eight) hours as needed for nausea or vomiting. 90 tablet 1  . PREVACID SOLUTAB 30 MG disintegrating tablet Take 1 tablet (30 mg total) by mouth 2 (two) times daily. 180 tablet 3  . rosuvastatin (CRESTOR) 20 MG tablet Take 1 tablet (20 mg total) by mouth daily. 90 tablet 1  . SYNTHROID 88 MCG tablet Take 1 tablet (88 mcg total) by mouth daily before breakfast. 90 tablet 3  . traZODone (DESYREL) 50 MG tablet Take 1-2 tablets (50-100 mg total) by mouth at bedtime as needed for sleep. 60 tablet 3  . valACYclovir (VALTREX) 500 MG tablet TAKE ONE TABLET BY MOUTH TWICE DAILY (Patient not taking: Reported on 07/22/2017) 20 tablet 4  . vitamin B-12 (CYANOCOBALAMIN) 1000 MCG tablet Take 1,000 mcg by mouth daily.      . Azelastine HCl 0.15 % SOLN Place 2 sprays into both nostrils 2 (two) times daily. 30 mL  3  . estradiol (VIVELLE-DOT) 0.075 MG/24HR APPLY 1 PATCH ONTO THE SKIN2 TIMES A WEEK 24 patch 3   No current facility-administered medications for this visit.    Facility-Administered Medications Ordered in Other Visits  Medication Dose Route Frequency Provider Last Rate Last Dose  . gadopentetate dimeglumine (MAGNEVIST) injection 15 mL  15 mL Intravenous Once PRN Penumalli, Earlean Polka, MD       Social History   Social History  . Marital status: Married    Spouse name: [name]  . Number of children: 2  . Years of education: College   Occupational History  . homemaker    Social History Main Topics  . Smoking status: Never Smoker  . Smokeless tobacco: Never Used  . Alcohol use No  . Drug use: No  . Sexual activity: Yes    Partners: Male    Birth control/ protection: Surgical     Comment: 1st  intercourse 22 yo-1 partner-[facility]ST   Other Topics Concern  . Not on file   Social History Narrative   Marital status: married x 36 years      Children: 2 children (1 biological son 32 years old; 43 adopted daughter 59 years old); no grandchildren.      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      Tobacco:  None      Alcohol:  Rarely      Drugs: none      Exercise: Yes. Walking daily around neighborhood.      Receiving therapy with Vivia Budge.   Son lives locally.   Adopted daughter from [place] 11/2016.   Family History  Problem Relation Age of Onset  . Heart disease Mother 27       CABG x 2  . Hypertension Mother   . Hyperlipidemia Mother   . Heart disease Father 88       AMI as cause of death  . Hypertension Father   . Emphysema Father 61       was a smoker  . COPD Father   . Heart disease Sister   . Hypertension Sister   . Diabetes Paternal Grandmother   . Multiple sclerosis Other   . Cancer Maternal Grandmother   . Heart disease Brother   . Leukemia Maternal Grandfather   . Cancer Maternal Aunt        leukemia       Objective:    BP 129/87 (BP Location: Right Arm, Patient Position: Sitting, Cuff Size: Normal)   Pulse 89   Temp 97.7 F (36.5 C) (Oral)   Resp 18   Ht 5\' 4"  (1.626 m)   LMP 05/23/2006   SpO2 98%  Physical Exam  Constitutional: She is oriented to person, place, and time. She appears well-developed and well-nourished. No distress.  HENT:  Head: Normocephalic and atraumatic.  Right Ear: External ear normal.  Left Ear: External ear normal.  Nose: Nose normal.  Mouth/Throat: Oropharynx is clear and moist.  Eyes: Pupils are equal, round, and reactive to light. Conjunctivae and EOM are normal.  Neck: Normal range of motion and full passive range of motion without pain. Neck supple. No JVD present. Carotid bruit is not present. No thyromegaly present.  Cardiovascular: Normal rate,  regular rhythm and normal heart sounds.  Exam reveals no gallop and no friction rub.   No murmur heard. Pulmonary/Chest: Effort normal and breath sounds normal. She has no wheezes. She has no rales.  Abdominal: Soft. Bowel sounds are normal. She exhibits no distension and no mass. There is no tenderness. There is no rebound and no guarding.  Musculoskeletal:       Right shoulder: Normal.       Left shoulder: Normal.       Cervical back: Normal.  Lymphadenopathy:    She has no cervical adenopathy.  Neurological: She is alert and oriented to person, place, and time. She has normal reflexes. No cranial nerve deficit. She exhibits normal muscle tone. Coordination normal.  Skin: Skin is warm and dry. No rash noted. She is not diaphoretic. No erythema. No pallor.  Multiple SK along back; +sebaceous cyst 1cm upper RIGHT back.  Psychiatric: She has a normal mood and affect. Her behavior is normal. Judgment and thought content normal.  Nursing note and vitals reviewed.   No results found. Depression screen [facility] 2/9 07/10/2017 07/09/2017 05/18/2017 11/03/2016 08/25/2016  Decreased Interest 0 0 0 0 0  Down, Depressed, Hopeless 0 0 0 - 0  PHQ - 2 Score 0 0 0 0 0  Altered sleeping - - - - -  Tired, decreased energy - - - - -  Change in appetite - - - - -  Feeling bad or failure about yourself  - - - - -  Trouble concentrating - - - - -  Moving slowly or fidgety/restless - - - - -  Suicidal thoughts - - - - -  PHQ-9 Score - - - - -  Some recent data might be hidden   Fall Risk  07/10/2017 07/09/2017 05/18/2017 11/03/2016 08/25/2016  Falls in the past year? No No No No No  Number falls in past yr: - - - - -  Injury with Fall? - - - - -  [place] for fall due to : - - - - -  Risk for fall due to: Comment - - - - -        Assessment & Plan:   1. Routine physical examination   2. Screening for tuberculosis   3. Dyslipidemia   4. Screening for HIV (human immunodeficiency virus)     5. Essential hypertension, benign   6. Postoperative hypothyroidism   7. Need for hepatitis B screening test   8. Screening for diabetes mellitus   9. Breast cancer screening   10. Positive QuantiFERON-TB Gold test   11. Acute non-recurrent maxillary sinusitis    -anticipatory guidance provided --- exercise, weight loss, safe driving practices, aspirin 81mg  daily. -obtain age appropriate screening labs and labs for chronic disease management. -obtain Tb gold for adoption process.  Tb gold positive; thus, obtained CXR which is negative; refer to [facility] Department for further evaluation.   Orders Placed This Encounter  Procedures  . MM SCREENING BREAST TOMO BILATERAL    Standing Status:   Future    Standing Expiration Date:   09/08/2018    Order Specific Question:   Reason for Exam (SYMPTOM  OR DIAGNOSIS REQUIRED)    Answer:   annual screening    Order Specific Question:   Is the patient pregnant?    Answer:   No    Order Specific Question:   Preferred imaging location?    Answer:   [facility]  . DG Chest 1 View    Standing Status:   Future    Number of Occurrences:   1    Standing Expiration Date:   07/20/2018  Order Specific Question:   Reason for Exam (SYMPTOM  OR DIAGNOSIS REQUIRED)    Answer:   +Tb Gold    Order Specific Question:   Is the patient pregnant?    Answer:   No    Order Specific Question:   Preferred imaging location?    Answer:   External  . CBC with Differential/Platelet  . Comprehensive metabolic panel    Order Specific Question:   Has the patient fasted?    Answer:   Yes  . Hemoglobin A1c  . Lipid panel    Order Specific Question:   Has the patient fasted?    Answer:   Yes  . TSH  . T4, free  . HIV antibody  . Quantiferon tb gold assay  . Hepatitis B surface antigen  . QuantiFERON In Tube  . POCT urinalysis dipstick   Meds ordered this encounter  Medications  . acyclovir ointment (ZOVIRAX) 5 %    Sig: APPLY ONE  APPLICATION TOPICALLY EVERY THREE HOURS    Dispense:  30 g    Refill:  2  . albuterol (VENTOLIN HFA) 108 (90 Base) MCG/ACT inhaler    Sig: INHALE TWO PUFFS BY MOUTH EVERY FOUR HOURS AS NEEDED FOR WHEEZING, SHORTNESS OF BREATH OR COUGH    Dispense:  18 Inhaler    Refill:  1  . Azelastine HCl 0.15 % SOLN    Sig: Place 2 sprays into both nostrils 2 (two) times daily.    Dispense:  30 mL    Refill:  3  . metoprolol succinate (TOPROL-XL) 25 MG 24 hr tablet    Sig: TAKE 1 TABLET EVERY MORNING, IF BLOOD PRESSURE STILL ELEVATED TAKE 1 TABLET IN AFTERNOON    Dispense:  180 tablet    Refill:  2  . rosuvastatin (CRESTOR) 20 MG tablet    Sig: Take 1 tablet (20 mg total) by mouth daily.    Dispense:  90 tablet    Refill:  1  . SYNTHROID 88 MCG tablet    Sig: Take 1 tablet (88 mcg total) by mouth daily before breakfast.    Dispense:  90 tablet    Refill:  3    No Follow-up on file.   Rollin Kotowski Elayne Guerin, M.D. Primary Care at [facility] [address] [place], [place]  [phone] phone [phone] fax

## 2017-07-10 ENCOUNTER — Encounter: Payer: Self-pay | Admitting: Family Medicine

## 2017-07-10 ENCOUNTER — Ambulatory Visit (INDEPENDENT_AMBULATORY_CARE_PROVIDER_SITE_OTHER): Payer: BLUE CROSS/BLUE SHIELD | Admitting: Family Medicine

## 2017-07-10 VITALS — BP 124/86 | HR 80 | Temp 98.0°F | Resp 16 | Ht 63.39 in

## 2017-07-10 DIAGNOSIS — L821 Other seborrheic keratosis: Secondary | ICD-10-CM

## 2017-07-10 DIAGNOSIS — D229 Melanocytic nevi, unspecified: Secondary | ICD-10-CM | POA: Diagnosis not present

## 2017-07-10 LAB — CBC WITH DIFFERENTIAL/PLATELET
BASOS ABS: 0.1 10*3/uL (ref 0.0–0.2)
Basos: 1 %
EOS (ABSOLUTE): 0.3 10*3/uL (ref 0.0–0.4)
Eos: 3 %
HEMOGLOBIN: 14.4 g/dL (ref 11.1–15.9)
Hematocrit: 43.3 % (ref 34.0–46.6)
IMMATURE GRANS (ABS): 0 10*3/uL (ref 0.0–0.1)
IMMATURE GRANULOCYTES: 0 %
LYMPHS: 34 %
Lymphocytes Absolute: 3.6 10*3/uL — ABNORMAL HIGH (ref 0.7–3.1)
MCH: 31.6 pg (ref 26.6–33.0)
MCHC: 33.3 g/dL (ref 31.5–35.7)
MCV: 95 fL (ref 79–97)
MONOCYTES: 6 %
Monocytes Absolute: 0.6 10*3/uL (ref 0.1–0.9)
NEUTROS PCT: 56 %
Neutrophils Absolute: 6 10*3/uL (ref 1.4–7.0)
PLATELETS: 272 10*3/uL (ref 150–379)
RBC: 4.55 x10E6/uL (ref 3.77–5.28)
RDW: 13.1 % (ref 12.3–15.4)
WBC: 10.6 10*3/uL (ref 3.4–10.8)

## 2017-07-10 LAB — COMPREHENSIVE METABOLIC PANEL
ALBUMIN: 4.5 g/dL (ref 3.5–5.5)
ALK PHOS: 56 IU/L (ref 39–117)
ALT: 28 IU/L (ref 0–32)
AST: 31 IU/L (ref 0–40)
Albumin/Globulin Ratio: 1.9 (ref 1.2–2.2)
BILIRUBIN TOTAL: 0.6 mg/dL (ref 0.0–1.2)
BUN / CREAT RATIO: 17 (ref 9–23)
BUN: 12 mg/dL (ref 6–24)
CHLORIDE: 103 mmol/L (ref 96–106)
CO2: 25 mmol/L (ref 20–29)
CREATININE: 0.69 mg/dL (ref 0.57–1.00)
Calcium: 9.5 mg/dL (ref 8.7–10.2)
GFR calc non Af Amer: 98 mL/min/{1.73_m2} (ref >59)
GFR, EST AFRICAN AMERICAN: 113 mL/min/{1.73_m2} (ref >59)
GLUCOSE: 93 mg/dL (ref 65–99)
Globulin, Total: 2.4 g/dL (ref 1.5–4.5)
Potassium: 4.5 mmol/L (ref 3.5–5.2)
Sodium: 142 mmol/L (ref 134–144)
TOTAL PROTEIN: 6.9 g/dL (ref 6.0–8.5)

## 2017-07-10 LAB — HIV ANTIBODY (ROUTINE TESTING W REFLEX): HIV SCREEN 4TH GENERATION: NONREACTIVE

## 2017-07-10 LAB — LIPID PANEL
CHOL/HDL RATIO: 3.9 ratio (ref 0.0–4.4)
CHOLESTEROL TOTAL: 209 mg/dL — AB (ref 100–199)
HDL: 53 mg/dL (ref >39)
LDL Calculated: 97 mg/dL (ref 0–99)
TRIGLYCERIDES: 295 mg/dL — AB (ref 0–149)
VLDL Cholesterol Cal: 59 mg/dL — ABNORMAL HIGH (ref 5–40)

## 2017-07-10 LAB — HEMOGLOBIN A1C
Est. average glucose Bld gHb Est-mCnc: 108 mg/dL
HEMOGLOBIN A1C: 5.4 % (ref 4.8–5.6)

## 2017-07-10 LAB — TSH: TSH: 0.467 u[IU]/mL (ref 0.450–4.500)

## 2017-07-10 LAB — T4, FREE: Free T4: 1.19 ng/dL (ref 0.82–1.77)

## 2017-07-10 LAB — HEPATITIS B SURFACE ANTIGEN: HEP B S AG: NEGATIVE

## 2017-07-10 NOTE — Patient Instructions (Signed)
     IF you received an x-ray today, you will receive an invoice from [facility]. Please contact [facility] at [phone] with questions or concerns regarding your invoice.   IF you received labwork today, you will receive an invoice from [facility]. Please contact [facility] at [phone] with questions or concerns regarding your invoice.   Our billing staff will not be able to assist you with questions regarding bills from these companies.  You will be contacted with the lab results as soon as they are available. The fastest way to get your results is to activate your My Chart account. Instructions are located on the last page of this paperwork. If you have not heard from us regarding the results in 2 weeks, please contact this office.     

## 2017-07-10 NOTE — Progress Notes (Signed)
Subjective:    Patient ID: [name], female    DOB: Jun 16, 1961, 56 y.o.   MRN: [id number]  07/10/2017  Nevus (pt states she has a mole on her back she wants to get removed )   HPI This 56 y.o. female presents for removal of seborrhea keratoses on upper back that has color variation and irregular borders.  Husband is worried about changing features of mole.     BP Readings from Last 3 Encounters:  07/22/17 126/78  07/10/17 124/86  07/09/17 129/87   Wt Readings from Last 3 Encounters:  07/22/17 138 lb (62.6 kg)  07/18/16 137 lb (62.1 kg)  06/24/16 140 lb (63.5 kg)   Immunization History  Administered Date(s) Administered  . Influenza Split 08/24/2015  . Influenza-Unspecified 08/15/2016  . PPD Test 12/15/2015    Review of Systems  Skin: Positive for color change.    Past Medical History:  Diagnosis Date  . Allergy   . Anaphylaxis   . Asthma   . Depression   . Gastroparesis   . Gastroparesis    history of  . GERD (gastroesophageal reflux disease)   . Headache(784.0)    migraines  . Hypercholesteremia   . Hypothyroidism   . Osteoarthritis   . Osteopenia 06/2013   T score -1.2 FRAX 8.6%/0.6%  . Overdose, drug 10/30/2011  . PONV (postoperative nausea and vomiting)   . Reflux   . Seizures ([place])    1 seizure secondary to wellbutrin-none since  . Thyroid cancer ([place])    papillary  . VAIN I (vaginal intraepithelial neoplasia grade I) 09/2013, 11/2014    with positive high-risk HPV screen    Past Surgical History:  Procedure Laterality Date  . BREAST SURGERY     reduc tion mammoplasty  . CHOLECYSTECTOMY  2004  . COLPOSCOPY  02/2012   VAIN I  . COSMETIC SURGERY     buttock liposuction  . HERNIA REPAIR  2012  . NISSEN FUNDOPLICATION  6948, 5462   REFLUX  . THYROID SURGERY  2007   for thyroid cancer  . UPPER GASTROINTESTINAL ENDOSCOPY  08/26/2004  . VAGINAL HYSTERECTOMY  2007   irregular bleeding  . VARICOSE VEIN SURGERY  03/20/2003   right leg    Allergies  Allergen Reactions  . Clarithromycin Anaphylaxis  . Lunesta [Eszopiclone] Other (See Comments)    Causes sleep walking events  . Nutritional Supplements Anaphylaxis  . Pneumococcal Vaccines Anaphylaxis  . Sulfamethoxazole-Trimethoprim Anaphylaxis  . Tetanus Toxoids Swelling  . Wellbutrin [Bupropion Hcl] Other (See Comments)    seizure  . Zolpidem     Causes sleep walking events  . Bactrim Hives and Other (See Comments)    Severe headache  . Clarithromycin Itching and Other (See Comments)    Severe headache  . Doxycycline Other (See Comments)    Severe GI upset  . Hydone [Chlorthalidone] Other (See Comments)    Vasculitis   . Ranitidine Other (See Comments)    Acts like a diuretic   . Reglan [Metoclopramide] Other (See Comments)    Tremors and ticks, possible permanence of effects  . Sulfa Antibiotics Hives and Other (See Comments)    Severe headache  . Tetracyclines & Related Other (See Comments)    Severe GI upset   Current Outpatient Prescriptions  Medication Sig Dispense Refill  . BELSOMRA 20 MG TABS TAKE 1 TABLET BY MOUTH AT BEDTIME 30 tablet 4  . EPINEPHrine 0.3 mg/0.3 mL IJ SOAJ injection Inject 0.3 mLs (0.3 mg  total) into the muscle once. (Patient not taking: Reported on 07/22/2017) 2 Device 2  . ezetimibe (ZETIA) 10 MG tablet TAKE 1 TABLET DAILY 90 tablet 1  . ondansetron (ZOFRAN-ODT) 4 MG disintegrating tablet Take 1 tablet (4 mg total) by mouth every 8 (eight) hours as needed for nausea or vomiting. 90 tablet 1  . PREVACID SOLUTAB 30 MG disintegrating tablet Take 1 tablet (30 mg total) by mouth 2 (two) times daily. 180 tablet 3  . traZODone (DESYREL) 50 MG tablet Take 1-2 tablets (50-100 mg total) by mouth at bedtime as needed for sleep. 60 tablet 3  . valACYclovir (VALTREX) 500 MG tablet TAKE ONE TABLET BY MOUTH TWICE DAILY (Patient not taking: Reported on 07/22/2017) 20 tablet 4  . vitamin B-12 (CYANOCOBALAMIN) 1000 MCG tablet Take 1,000 mcg by mouth  daily.      Marland Kitchen acyclovir ointment (ZOVIRAX) 5 % APPLY ONE APPLICATION TOPICALLY EVERY THREE HOURS 30 g 2  . albuterol (VENTOLIN HFA) 108 (90 Base) MCG/ACT inhaler INHALE TWO PUFFS BY MOUTH EVERY FOUR HOURS AS NEEDED FOR WHEEZING, SHORTNESS OF BREATH OR COUGH 18 Inhaler 1  . Azelastine HCl 0.15 % SOLN Place 2 sprays into both nostrils 2 (two) times daily. 30 mL 3  . estradiol (VIVELLE-DOT) 0.075 MG/24HR APPLY 1 PATCH ONTO THE SKIN2 TIMES A WEEK 24 patch 3  . metoprolol succinate (TOPROL-XL) 25 MG 24 hr tablet TAKE 1 TABLET EVERY MORNING, IF BLOOD PRESSURE STILL ELEVATED TAKE 1 TABLET IN AFTERNOON 180 tablet 2  . rosuvastatin (CRESTOR) 20 MG tablet Take 1 tablet (20 mg total) by mouth daily. 90 tablet 1  . SYNTHROID 88 MCG tablet Take 1 tablet (88 mcg total) by mouth daily before breakfast. 90 tablet 3   No current facility-administered medications for this visit.    Facility-Administered Medications Ordered in Other Visits  Medication Dose Route Frequency Provider Last Rate Last Dose  . gadopentetate dimeglumine (MAGNEVIST) injection 15 mL  15 mL Intravenous Once PRN Penumalli, Earlean Polka, MD       Social History   Social History  . Marital status: Married    Spouse name: [name]  . Number of children: 2  . Years of education: College   Occupational History  . homemaker    Social History Main Topics  . Smoking status: Never Smoker  . Smokeless tobacco: Never Used  . Alcohol use No  . Drug use: No  . Sexual activity: Yes    Partners: Male    Birth control/ protection: Surgical     Comment: 1st intercourse 22 yo-1 partner-HYST   Other Topics Concern  . Not on file   Social History Narrative   Marital status: married x 36 years      Children: 2 children (1 biological son 39 years old; 45 adopted daughter 50 years old); no grandchildren.      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      Tobacco:  None      Alcohol:   Rarely      Drugs: none      Exercise: Yes. Walking daily around neighborhood.      Receiving therapy with Vivia Budge.   Son lives locally.   Adopted daughter from [place] 11/2016.   Family History  Problem Relation Age of Onset  . Heart disease Mother 35       CABG x 2  . Hypertension Mother   . Hyperlipidemia Mother   .  Heart disease Father 19       AMI as cause of death  . Hypertension Father   . Emphysema Father 10       was a smoker  . COPD Father   . Heart disease Sister   . Hypertension Sister   . Diabetes Paternal Grandmother   . Multiple sclerosis Other   . Cancer Maternal Grandmother   . Heart disease Brother   . Leukemia Maternal Grandfather   . Cancer Maternal Aunt        leukemia       Objective:    BP 124/86   Pulse 80   Temp 98 F (36.7 C) (Oral)   Resp 16   Ht 5' 3.39" (1.61 m)   LMP 05/23/2006   SpO2 100%  Physical Exam  Constitutional: She is oriented to person, place, and time. She appears well-developed and well-nourished. No distress.  HENT:  Head: Normocephalic and atraumatic.  Eyes: Pupils are equal, round, and reactive to light. Conjunctivae are normal.  Neck: Normal range of motion. Neck supple.  Cardiovascular: Normal rate, regular rhythm and normal heart sounds.  Exam reveals no gallop and no friction rub.   No murmur heard. Pulmonary/Chest: Effort normal and breath sounds normal. She has no wheezes. She has no rales.  Neurological: She is alert and oriented to person, place, and time.  Skin: She is not diaphoretic.     Seborrhea keratosis along upper RIGHT thoracic back 4 mm with irregular borders and color variation.  Psychiatric: She has a normal mood and affect. Her behavior is normal.  Nursing note and vitals reviewed.  PROCEDURE NOTE: VERBAL CONSENT OBTAINED; AREA PREPPED IN STERILE FASHION.  1% LIDOCAINE WITH EPI 1 CC ADMINISTERED. SHAVE BIOPSY REMOVED LESION  SILVER NITRATE ADMINISTERED WITH GOOD HEMOSTASIS. No results  found. Depression screen [facility] 2/9 07/10/2017 07/09/2017 05/18/2017 11/03/2016 08/25/2016  Decreased Interest 0 0 0 0 0  Down, Depressed, Hopeless 0 0 0 - 0  PHQ - 2 Score 0 0 0 0 0  Altered sleeping - - - - -  Tired, decreased energy - - - - -  Change in appetite - - - - -  Feeling bad or failure about yourself  - - - - -  Trouble concentrating - - - - -  Moving slowly or fidgety/restless - - - - -  Suicidal thoughts - - - - -  PHQ-9 Score - - - - -  Some recent data might be hidden   Fall Risk  07/10/2017 07/09/2017 05/18/2017 11/03/2016 08/25/2016  Falls in the past year? No No No No No  Number falls in past yr: - - - - -  Injury with Fall? - - - - -  Canute for fall due to : - - - - -  Risk for fall due to: Comment - - - - -        Assessment & Plan:   1. Nevus   2. Keratosis, seborrheic    -S/P RESECTION due to changing color and irregular borders.  Send for pathology.   No orders of the defined types were placed in this encounter.  No orders of the defined types were placed in this encounter.   No Follow-up on file.   Kristi Elayne Guerin, M.D. Primary Care at [facility] [address] [place], [place]  [address] [phone] phone [phone] fax

## 2017-07-15 LAB — QUANTIFERON IN TUBE
QFT TB AG MINUS NIL VALUE: 0.48 IU/mL
QUANTIFERON MITOGEN VALUE: 7.86 [IU]/mL
QUANTIFERON NIL VALUE: 0.27 [IU]/mL
QUANTIFERON TB AG VALUE: 0.75 [IU]/mL
QUANTIFERON TB GOLD: POSITIVE — AB

## 2017-07-15 LAB — QUANTIFERON TB GOLD ASSAY (BLOOD)

## 2017-07-18 ENCOUNTER — Other Ambulatory Visit: Payer: Self-pay | Admitting: Women's Health

## 2017-07-19 ENCOUNTER — Other Ambulatory Visit: Payer: Self-pay | Admitting: Family Medicine

## 2017-07-20 ENCOUNTER — Telehealth: Payer: Self-pay | Admitting: Family Medicine

## 2017-07-20 ENCOUNTER — Ambulatory Visit (INDEPENDENT_AMBULATORY_CARE_PROVIDER_SITE_OTHER): Payer: BLUE CROSS/BLUE SHIELD

## 2017-07-20 DIAGNOSIS — R7612 Nonspecific reaction to cell mediated immunity measurement of gamma interferon antigen response without active tuberculosis: Secondary | ICD-10-CM | POA: Diagnosis not present

## 2017-07-21 ENCOUNTER — Telehealth: Payer: Self-pay | Admitting: Family Medicine

## 2017-07-21 NOTE — Telephone Encounter (Signed)
Tammy from the [facility] Dept. Called stating pt contacted her to be seen due to positive Quantiferon Gold. She requested we send over labs, x-ray, and ov notes that correspond with diagnosis. I have faxed over labs and the x-ray report and can send ov notes when done. Please advise. Tammy can be contacted at [phone]. Fax number is [phone]. Thanks!

## 2017-07-22 ENCOUNTER — Encounter: Payer: Self-pay | Admitting: Gynecology

## 2017-07-22 ENCOUNTER — Ambulatory Visit (INDEPENDENT_AMBULATORY_CARE_PROVIDER_SITE_OTHER): Payer: BLUE CROSS/BLUE SHIELD | Admitting: Gynecology

## 2017-07-22 VITALS — BP 126/78 | Ht 64.0 in | Wt 138.0 lb

## 2017-07-22 DIAGNOSIS — M8580 Other specified disorders of bone density and structure, unspecified site: Secondary | ICD-10-CM | POA: Diagnosis not present

## 2017-07-22 DIAGNOSIS — Z01411 Encounter for gynecological examination (general) (routine) with abnormal findings: Secondary | ICD-10-CM | POA: Diagnosis not present

## 2017-07-22 DIAGNOSIS — N952 Postmenopausal atrophic vaginitis: Secondary | ICD-10-CM

## 2017-07-22 DIAGNOSIS — Z79890 Hormone replacement therapy (postmenopausal): Secondary | ICD-10-CM

## 2017-07-22 DIAGNOSIS — N893 Dysplasia of vagina, unspecified: Secondary | ICD-10-CM

## 2017-07-22 MED ORDER — ESTRADIOL 0.075 MG/24HR TD PTTW: MEDICATED_PATCH | TRANSDERMAL | 3 refills | Status: DC

## 2017-07-22 NOTE — Patient Instructions (Signed)
Followup in one year for annual exam, sooner if any issues 

## 2017-07-22 NOTE — Addendum Note (Signed)
Addended by: Nelva Nay on: 07/22/2017 10:35 AM   Modules accepted: Orders

## 2017-07-22 NOTE — Progress Notes (Signed)
    Michelle [name] 02-Jul-1961 [id number]        56 y.o.  G1P1 for annual exam.    Past medical history,surgical history, problem list, medications, allergies, family history and social history were all reviewed and documented as reviewed in the EPIC chart.  ROS:  Performed with pertinent positives and negatives included in the history, assessment and plan.   Additional significant findings :  None   Exam: Caryn Bee assistant Vitals:   07/22/17 0939  BP: 126/78  Weight: 138 lb (62.6 kg)  Height: 5\' 4"  (1.626 m)   Body mass index is 23.69 kg/m.  General appearance:  Normal affect, orientation and appearance. Skin: Grossly normal HEENT: Without gross lesions.  No cervical or supraclavicular adenopathy. Thyroid normal.  Lungs:  Clear without wheezing, rales or rhonchi Cardiac: RR, without RMG Abdominal:  Soft, nontender, without masses, guarding, rebound, organomegaly or hernia Breasts:  Examined lying and sitting without masses, retractions, discharge or axillary adenopathy.  Well-healed bilateral reduction scars Pelvic:  Ext, BUS, Vagina: Normal with atrophic changes. Pap smear of vaginal cuff done  Adnexa: Without masses or tenderness    Anus and perineum: Normal   Rectovaginal: Normal sphincter tone without palpated masses or tenderness.    Assessment/Plan:  56 y.o. G1P1 female for annual exam, status post TVH 2007 for bleeding..   1. HRT. Continues on minivelle 0.075 mg patches doing well. Has tried to wean with unacceptable symptoms of hot flushes and sweats.  Reviewed risks versus benefits of HRT with her. Stroke heart attack DVT impossible breast cancer issues reviewed. Benefits of symptom relief, bone health and cardiovascular health discussed. At this point patient wants to continue I refilled her 1 year. 2. VAIN 1. History of VAIN 1 2011 following her vaginal hysterectomy for benign indications with no history of abnormal Pap smears previously. Colposcopy with biopsy  showed VAIN 1. Subsequent colposcopy with biopsy 2013 showed VAIN 1. Pap smear 2014 showed VAIN 1 with positive high-risk HPV negative subtype 16/18. Colposcopy was normal and no biopsies done. Pap smear 2016 showed LGSIL with positive high-risk HPV. Colposcopy biopsies were benign. Pap smear/HPV 2017 negative. Pap smear of vaginal cuff today. 3. Mammography overdo and patient knows to schedule and agrees to do so. Breast exam normal today. 4. Osteopenia. DEXA 2014 T score -1.2 FRAX 8.6%/0.6%. Discussed options to repeat now versus waiting until next year at 5 year interval. Patient prefers to wait and we'll plan on scheduling next year. 5. Colonoscopy 2016. Repeat at their recommended interval. 6. Health maintenance. No routine lab work done as patient reports this done elsewhere. Follow up 1 year, sooner as needed.   Anastasio Auerbach MD, 10:05 AM 07/22/2017

## 2017-07-24 ENCOUNTER — Encounter: Payer: Self-pay | Admitting: Family Medicine

## 2017-07-24 MED ORDER — ROSUVASTATIN CALCIUM 20 MG PO TABS: 20.0000 mg | ORAL_TABLET | Freq: Every day | ORAL | 1 refills | Status: DC

## 2017-07-24 MED ORDER — METOPROLOL SUCCINATE ER 25 MG PO TB24: ORAL_TABLET | ORAL | 2 refills | Status: DC

## 2017-07-24 MED ORDER — AZELASTINE HCL 0.15 % NA SOLN: 2.0000 | Freq: Two times a day (BID) | NASAL | 3 refills | Status: DC

## 2017-07-24 MED ORDER — ACYCLOVIR 5 % EX OINT: TOPICAL_OINTMENT | CUTANEOUS | 2 refills | Status: AC

## 2017-07-24 MED ORDER — SYNTHROID 88 MCG PO TABS: 88.0000 ug | ORAL_TABLET | Freq: Every day | ORAL | 3 refills | Status: AC

## 2017-07-24 MED ORDER — ALBUTEROL SULFATE HFA 108 (90 BASE) MCG/ACT IN AERS: INHALATION_SPRAY | RESPIRATORY_TRACT | 1 refills | Status: DC

## 2017-07-24 NOTE — Telephone Encounter (Signed)
Note completed from 07-09-17 visit; please fax to [facility] Department.  Thanks!

## 2017-07-26 ENCOUNTER — Encounter: Payer: Self-pay | Admitting: Family Medicine

## 2017-07-27 LAB — PAP IG W/ RFLX HPV ASCU

## 2017-07-27 NOTE — Telephone Encounter (Signed)
OV notes faxed on 9/4. Thank you!

## 2017-07-29 ENCOUNTER — Telehealth: Payer: Self-pay | Admitting: Family Medicine

## 2017-07-29 NOTE — Telephone Encounter (Signed)
Pt states that she just received her zofran in the mail and the dosage has been decreased from 90-30 and she needs to know if this is correct or not   Best number [phone]

## 2017-07-30 NOTE — Telephone Encounter (Signed)
Spoke with Tammy at [facility]; has received all information necessary; patient has appointment today to start treatment.

## 2017-08-02 ENCOUNTER — Telehealth: Payer: Self-pay | Admitting: Family Medicine

## 2017-08-02 NOTE — Telephone Encounter (Signed)
MS Michelle [name] TO FILL OUT SHE STATES THAT SHE NEED IT FILLED OUT AS SOON AS POSSIBLE I MYSELF WILL HAND IT TO THE DOCTOR PERSONALLY ON Tuesday I MADE AN EXTRA COPY JUST IN CASE ITS LOST

## 2017-08-09 ENCOUNTER — Telehealth: Payer: Self-pay | Admitting: Family Medicine

## 2017-08-09 ENCOUNTER — Telehealth: Payer: Self-pay

## 2017-08-09 NOTE — Telephone Encounter (Signed)
Received printed letter for pt and husband. Placed letter in Dr. Thompson Caul box to be signed.

## 2017-08-09 NOTE — Telephone Encounter (Signed)
Patient's husband requesting a letter stating that patient's PMH, PSH, and medications do not interfere with her ability to parent.   Please call patient to advise that she can pick up a copy of this letter today (unsigned by me) or tomorrow (signed by me).

## 2017-08-09 NOTE — Telephone Encounter (Signed)
Left detailed message to have pt call back regarding rx clarification.

## 2017-08-10 NOTE — Telephone Encounter (Signed)
Done

## 2017-08-18 ENCOUNTER — Ambulatory Visit: Payer: BLUE CROSS/BLUE SHIELD

## 2017-08-30 ENCOUNTER — Ambulatory Visit (INDEPENDENT_AMBULATORY_CARE_PROVIDER_SITE_OTHER): Payer: BLUE CROSS/BLUE SHIELD | Admitting: Family Medicine

## 2017-08-30 ENCOUNTER — Encounter: Payer: Self-pay | Admitting: Family Medicine

## 2017-08-30 VITALS — BP 126/76 | HR 85 | Temp 98.4°F | Resp 16 | Ht 63.39 in

## 2017-08-30 DIAGNOSIS — H16132 Photokeratitis, left eye: Secondary | ICD-10-CM

## 2017-08-30 DIAGNOSIS — L821 Other seborrheic keratosis: Secondary | ICD-10-CM | POA: Diagnosis not present

## 2017-08-30 DIAGNOSIS — R7612 Nonspecific reaction to cell mediated immunity measurement of gamma interferon antigen response without active tuberculosis: Secondary | ICD-10-CM | POA: Diagnosis not present

## 2017-08-30 NOTE — Patient Instructions (Signed)
     IF you received an x-ray today, you will receive an invoice from [facility]. Please contact [facility] at [phone] with questions or concerns regarding your invoice.   IF you received labwork today, you will receive an invoice from [facility]. Please contact [facility] at [phone] with questions or concerns regarding your invoice.   Our billing staff will not be able to assist you with questions regarding bills from these companies.  You will be contacted with the lab results as soon as they are available. The fastest way to get your results is to activate your My Chart account. Instructions are located on the last page of this paperwork. If you have not heard from us regarding the results in 2 weeks, please contact this office.     

## 2017-08-30 NOTE — Progress Notes (Signed)
Subjective:    Patient ID: [name], female    DOB: 09-19-1961, 56 y.o.   MRN: [id number]  08/30/2017  f/u mole on right groin (removed by derm but backnabout 5 mos)    HPI This 56 y.o. female presents for evaluation of persistent skin lesion lateral LEFT face and R groin region.  S/p cryotherapy of skin lesion lateral face; would like remaining lesion frozen today. Also had R groin skin lesion cryotherapied by dermatologist and has grown back.   Positive Tb Gold: s/p CXR that was negative; s/p [facility] Department consultation; recommend starting treatment once returns from [place] from picking up second child.  Denies fever/chills/night sweats.  Denies cough, sputum production. Good energy.    BP Readings from Last 3 Encounters:  08/30/17 126/76  07/22/17 126/78  07/10/17 124/86   Wt Readings from Last 3 Encounters:  07/22/17 138 lb (62.6 kg)  07/18/16 137 lb (62.1 kg)  06/24/16 140 lb (63.5 kg)   Immunization History  Administered Date(s) Administered  . Influenza Split 08/24/2015  . Influenza-Unspecified 08/15/2016  . PPD Test 12/15/2015    Review of Systems  Constitutional: Negative for chills, diaphoresis, fatigue and fever.  HENT: Negative for ear pain, postnasal drip, rhinorrhea, sinus pressure, sore throat and trouble swallowing.   Respiratory: Negative for cough and shortness of breath.   Cardiovascular: Negative for chest pain, palpitations and leg swelling.  Gastrointestinal: Negative for abdominal pain, constipation, diarrhea, nausea and vomiting.  Skin: Positive for color change.    Past Medical History:  Diagnosis Date  . Allergy   . Anaphylaxis   . Asthma   . Depression   . Gastroparesis   . Gastroparesis    history of  . GERD (gastroesophageal reflux disease)   . Headache(784.0)    migraines  . Hypercholesteremia   . Hypothyroidism   . Osteoarthritis   . Osteopenia 06/2013   T score -1.2 FRAX 8.6%/0.6%  . Overdose, drug  10/30/2011  . PONV (postoperative nausea and vomiting)   . Reflux   . Seizures ([place])    1 seizure secondary to wellbutrin-none since  . Thyroid cancer ([place])    papillary  . VAIN I (vaginal intraepithelial neoplasia grade I) 09/2013, 11/2014    with positive high-risk HPV screen    Past Surgical History:  Procedure Laterality Date  . BREAST SURGERY     reduc tion mammoplasty  . CHOLECYSTECTOMY  2004  . COLPOSCOPY  02/2012   VAIN I  . COSMETIC SURGERY     buttock liposuction  . HERNIA REPAIR  2012  . NISSEN FUNDOPLICATION  3295, 1884   REFLUX  . THYROID SURGERY  2007   for thyroid cancer  . UPPER GASTROINTESTINAL ENDOSCOPY  08/26/2004  . VAGINAL HYSTERECTOMY  2007   irregular bleeding  . VARICOSE VEIN SURGERY  03/20/2003   right leg   Allergies  Allergen Reactions  . Clarithromycin Anaphylaxis  . Lunesta [Eszopiclone] Other (See Comments)    Causes sleep walking events  . Nutritional Supplements Anaphylaxis  . Pneumococcal Vaccines Anaphylaxis  . Sulfamethoxazole-Trimethoprim Anaphylaxis  . Tetanus Toxoids Swelling  . Wellbutrin [Bupropion Hcl] Other (See Comments)    seizure  . Zolpidem     Causes sleep walking events  . Bactrim Hives and Other (See Comments)    Severe headache  . Clarithromycin Itching and Other (See Comments)    Severe headache  . Doxycycline Other (See Comments)    Severe GI upset  . Hydone [  Chlorthalidone] Other (See Comments)    Vasculitis   . Ranitidine Other (See Comments)    Acts like a diuretic   . Reglan [Metoclopramide] Other (See Comments)    Tremors and ticks, possible permanence of effects  . Sulfa Antibiotics Hives and Other (See Comments)    Severe headache  . Tetracyclines & Related Other (See Comments)    Severe GI upset   Current Outpatient Prescriptions on File Prior to Visit  Medication Sig Dispense Refill  . acyclovir ointment (ZOVIRAX) 5 % APPLY ONE APPLICATION TOPICALLY EVERY THREE HOURS 30 g 2  . albuterol (VENTOLIN  HFA) 108 (90 Base) MCG/ACT inhaler INHALE TWO PUFFS BY MOUTH EVERY FOUR HOURS AS NEEDED FOR WHEEZING, SHORTNESS OF BREATH OR COUGH 18 Inhaler 1  . Azelastine HCl 0.15 % SOLN Place 2 sprays into both nostrils 2 (two) times daily. 30 mL 3  . BELSOMRA 20 MG TABS TAKE 1 TABLET BY MOUTH AT BEDTIME 30 tablet 4  . EPINEPHrine 0.3 mg/0.3 mL IJ SOAJ injection Inject 0.3 mLs (0.3 mg total) into the muscle once. (Patient not taking: Reported on 07/22/2017) 2 Device 2  . estradiol (VIVELLE-DOT) 0.075 MG/24HR APPLY 1 PATCH ONTO THE SKIN2 TIMES A WEEK 24 patch 3  . ezetimibe (ZETIA) 10 MG tablet TAKE 1 TABLET DAILY 90 tablet 1  . metoprolol succinate (TOPROL-XL) 25 MG 24 hr tablet TAKE 1 TABLET EVERY MORNING, IF BLOOD PRESSURE STILL ELEVATED TAKE 1 TABLET IN AFTERNOON 180 tablet 2  . ondansetron (ZOFRAN-ODT) 4 MG disintegrating tablet Take 1 tablet (4 mg total) by mouth every 8 (eight) hours as needed for nausea or vomiting. 90 tablet 1  . PREVACID SOLUTAB 30 MG disintegrating tablet Take 1 tablet (30 mg total) by mouth 2 (two) times daily. 180 tablet 3  . rosuvastatin (CRESTOR) 20 MG tablet Take 1 tablet (20 mg total) by mouth daily. 90 tablet 1  . SYNTHROID 88 MCG tablet Take 1 tablet (88 mcg total) by mouth daily before breakfast. 90 tablet 3  . traZODone (DESYREL) 50 MG tablet Take 1-2 tablets (50-100 mg total) by mouth at bedtime as needed for sleep. 60 tablet 3  . valACYclovir (VALTREX) 500 MG tablet TAKE ONE TABLET BY MOUTH TWICE DAILY (Patient not taking: Reported on 07/22/2017) 20 tablet 4  . vitamin B-12 (CYANOCOBALAMIN) 1000 MCG tablet Take 1,000 mcg by mouth daily.       Current Facility-Administered Medications on File Prior to Visit  Medication Dose Route Frequency Provider Last Rate Last Dose  . gadopentetate dimeglumine (MAGNEVIST) injection 15 mL  15 mL Intravenous Once PRN Penumalli, Earlean Polka, MD       Social History   Social History  . Marital status: Married    Spouse name: [name]  .  Number of children: 2  . Years of education: College   Occupational History  . homemaker    Social History Main Topics  . Smoking status: Never Smoker  . Smokeless tobacco: Never Used  . Alcohol use No  . Drug use: No  . Sexual activity: Yes    Partners: Male    Birth control/ protection: Surgical     Comment: 1st intercourse 22 yo-1 partner-HYST   Other Topics Concern  . Not on file   Social History Narrative   Marital status: married x 36 years      Children: 2 children (1 biological son 51 years old; 3 adopted daughter 57 years old); no grandchildren.      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      Tobacco:  None      Alcohol:  Rarely      Drugs: none      Exercise: Yes. Walking daily around neighborhood.      Receiving therapy with Vivia Budge.   Son lives locally.   Adopted daughter from [place] 11/2016.   Family History  Problem Relation Age of Onset  . Heart disease Mother 21       CABG x 2  . Hypertension Mother   . Hyperlipidemia Mother   . Heart disease Father 23       AMI as cause of death  . Hypertension Father   . Emphysema Father 77       was a smoker  . COPD Father   . Heart disease Sister   . Hypertension Sister   . Diabetes Paternal Grandmother   . Multiple sclerosis Other   . Cancer Maternal Grandmother   . Heart disease Brother   . Leukemia Maternal Grandfather   . Cancer Maternal Aunt        leukemia       Objective:    BP 126/76 (BP Location: Left Arm, Patient Position: Sitting, Cuff Size: Normal)   Pulse 85   Temp 98.4 F (36.9 C) (Oral)   Resp 16   Ht 5' 3.39" (1.61 m)   LMP 05/23/2006   SpO2 98%  Physical Exam  Constitutional: She is oriented to person, place, and time. She appears well-developed and well-nourished. No distress.  HENT:  Head: Normocephalic and atraumatic.    L facial: 50mm rough hyperpigmented skin lesion. R groin: 31mm x 14mm scaling  hyperpigmented skin lesion identified.  Eyes: Pupils are equal, round, and reactive to light. Conjunctivae are normal.  Neck: Normal range of motion. Neck supple.  Cardiovascular: Normal rate, regular rhythm and normal heart sounds.  Exam reveals no gallop and no friction rub.   No murmur heard. Pulmonary/Chest: Effort normal and breath sounds normal. She has no wheezes. She has no rales.  Neurological: She is alert and oriented to person, place, and time.  Skin: She is not diaphoretic.     Psychiatric: She has a normal mood and affect. Her behavior is normal.  Nursing note and vitals reviewed.  No results found. Depression screen [facility] 2/9 08/30/2017 07/10/2017 07/09/2017 05/18/2017 11/03/2016  Decreased Interest 0 0 0 0 0  Down, Depressed, Hopeless 0 0 0 0 -  PHQ - 2 Score 0 0 0 0 0  Altered sleeping - - - - -  Tired, decreased energy - - - - -  Change in appetite - - - - -  Feeling bad or failure about yourself  - - - - -  Trouble concentrating - - - - -  Moving slowly or fidgety/restless - - - - -  Suicidal thoughts - - - - -  PHQ-9 Score - - - - -  Some recent data might be hidden   Fall Risk  08/30/2017 07/10/2017 07/09/2017 05/18/2017 11/03/2016  Falls in the past year? No No No No No  Number falls in past yr: - - - - -  Injury with Fall? - - - - -  [place] for fall due to : - - - - -  Risk for fall due to: Comment - - - - -   PROCEDURE NOTE: VERBAL CONSENT OBTAINED; SKIN  CLEANSED WITH ALCOHOL SWAB; CRYOTHERAPY X 3 TO LEFT FACIAL LESION AND RIGHT GROIN LESION.  PATIENT TOLERATED WELL.       Assessment & Plan:   1. Positive QuantiFERON-TB Gold test   2. Keratosis seborrheica   3. Actinic keratitis of left eye     -New onset Tb Gold positive testing; s/p [place] consultation; to initiate therapy in April 2019. Asymptomatic at this time. -recurrent actinic keratoses on face; s/p repeat cryotherapy. -recurrent seborrhea keratosis on R  groin; s/p cryotherapy.   No orders of the defined types were placed in this encounter.  No orders of the defined types were placed in this encounter.   No Follow-up on file.   Kristi Elayne Guerin, M.D. Primary Care at [facility] [address] [place], [place]  [address] [phone] phone [phone] fax

## 2017-09-13 ENCOUNTER — Ambulatory Visit (INDEPENDENT_AMBULATORY_CARE_PROVIDER_SITE_OTHER): Payer: BLUE CROSS/BLUE SHIELD | Admitting: Family Medicine

## 2017-09-13 DIAGNOSIS — Z23 Encounter for immunization: Secondary | ICD-10-CM

## 2017-09-18 ENCOUNTER — Other Ambulatory Visit: Payer: Self-pay | Admitting: Family Medicine

## 2017-09-18 DIAGNOSIS — F5101 Primary insomnia: Secondary | ICD-10-CM

## 2017-09-23 ENCOUNTER — Other Ambulatory Visit: Payer: Self-pay | Admitting: Gynecology

## 2017-10-09 ENCOUNTER — Other Ambulatory Visit: Payer: Self-pay | Admitting: Physician Assistant

## 2017-10-09 ENCOUNTER — Other Ambulatory Visit: Payer: Self-pay | Admitting: Family Medicine

## 2017-11-17 ENCOUNTER — Encounter: Payer: BLUE CROSS/BLUE SHIELD | Admitting: Family Medicine

## 2017-11-27 ENCOUNTER — Other Ambulatory Visit: Payer: Self-pay | Admitting: Family Medicine

## 2017-12-21 ENCOUNTER — Telehealth: Payer: Self-pay | Admitting: Family Medicine

## 2017-12-21 ENCOUNTER — Ambulatory Visit: Payer: Self-pay | Admitting: *Deleted

## 2017-12-21 NOTE — Telephone Encounter (Signed)
Called in c/o having a cough from a head cold she had 3-4 weeks ago that has now settled into her chest.   She has asthma and is using her inhaler but usually needs a round of Prednisone to clear her up like this. I scheduled her with Daphane Shepherd, PA-C for tomorrow at 3:00.   Reason for Disposition . Cough has been present for > 3 weeks  Answer Assessment - Initial Assessment Questions 1. ONSET: "When did the cough begin?"      Had a cold 3-4 weeks ago it got better but now I have a cough that has settled into my chest. 2. SEVERITY: "How bad is the cough today?"      Yes.  It's a pain in the butt.   At night it is worse.   I'm using my inhaler which helps.   I have asthma. 3. RESPIRATORY DISTRESS: "Describe your breathing."      No shortness of breath. 4. FEVER: "Do you have a fever?" If so, ask: "What is your temperature, how was it measured, and when did it start?"     No fever 5. SPUTUM: "Describe the color of your sputum" (clear, white, yellow, green)     Clear sputum if I cough up something. 6. HEMOPTYSIS: "Are you coughing up any blood?" If so ask: "How much?" (flecks, streaks, tablespoons, etc.)     No 7. CARDIAC HISTORY: "Do you have any history of heart disease?" (e.g., heart attack, congestive heart failure)      No 8. LUNG HISTORY: "Do you have any history of lung disease?"  (e.g., pulmonary embolus, asthma, emphysema)     Asthma   No blood clots 9. PE RISK FACTORS: "Do you have a history of blood clots?" (or: recent major surgery, recent prolonged travel, bedridden )     No 10. OTHER SYMPTOMS: "Do you have any other symptoms?" (e.g., runny nose, wheezing, chest pain)       No wheezing 11. PREGNANCY: "Is there any chance you are pregnant?" "When was your last menstrual period?"       Not asked due to age 57. TRAVEL: "Have you traveled out of the country in the last month?" (e.g., travel history, exposures)       We adopted a child from an orphanage but she goes to  preschool.  Protocols used: COUGH - ACUTE PRODUCTIVE-A-AH

## 2017-12-21 NOTE — Telephone Encounter (Signed)
Called   Left   Message   To call  Back  To  Discuss   Symptoms

## 2017-12-21 NOTE — Telephone Encounter (Signed)
Copied from [place]. Topic: Quick Communication - See Telephone Encounter >> Dec 21, 2017  2:58 PM Hewitt Shorts wrote: CRM for notification. See Telephone encounter for:  Pt states that she has developed cough congestion and has settled in the chest and is need a rx for prednizone   Best number [phone]  [facility] [address] [phone]  12/21/17.

## 2017-12-22 ENCOUNTER — Encounter: Payer: Self-pay | Admitting: Physician Assistant

## 2017-12-22 ENCOUNTER — Ambulatory Visit: Payer: Commercial Managed Care - PPO | Admitting: Physician Assistant

## 2017-12-22 ENCOUNTER — Other Ambulatory Visit: Payer: Self-pay

## 2017-12-22 VITALS — BP 118/74 | HR 95 | Temp 98.8°F | Resp 18 | Ht 63.39 in

## 2017-12-22 DIAGNOSIS — J4540 Moderate persistent asthma, uncomplicated: Secondary | ICD-10-CM | POA: Diagnosis not present

## 2017-12-22 DIAGNOSIS — R05 Cough: Secondary | ICD-10-CM | POA: Diagnosis not present

## 2017-12-22 DIAGNOSIS — R059 Cough, unspecified: Secondary | ICD-10-CM

## 2017-12-22 MED ORDER — HYDROCOD POLST-CPM POLST ER 10-8 MG/5ML PO SUER: 5.0000 mL | Freq: Two times a day (BID) | ORAL | 0 refills | Status: DC | PRN

## 2017-12-22 MED ORDER — PREDNISONE 20 MG PO TABS: ORAL_TABLET | ORAL | 0 refills | Status: DC

## 2017-12-22 NOTE — Progress Notes (Signed)
Patient ID: Michelle [name], female    DOB: 1961/06/21, 57 y.o.   MRN: [id number]  PCP: Wardell Honour, MD  Chief Complaint  Patient presents with  . Cough    x3weeks     Subjective:   Presents for evaluation of cough x 3 weeks.  Had a head cold, and it improved. She thought she was going to be OK, but cough has persisted. Coughing a lot. Feels tight, occasional "crackle" sound with breathing. Symptoms are worse at night. Cough is aggravating reflux.  No fever, chills. No HA, dizziness. No urinary symptoms.  Review of Systems As above.    Patient Active Problem List   Diagnosis Date Noted  . Insomnia 08/25/2016  . Abnormal brain MRI 11/07/2015  . Cough 04/02/2013  . Abnormal CXR 04/02/2013  . Leukocytosis 10/18/2012  . History of anaphylaxis 03/01/2012  . Depression 10/30/2011  . H/O suicide attempt 10/30/2011  . Hx of thyroid cancer   . Hypothyroidism   . Hypertension   . Asthma   . Gastroparesis   . Hypercholesteremia   . VAIN (vaginal intraepithelial neoplasia)   . GERD (gastroesophageal reflux disease) 07/16/2011     Prior to Admission medications   Medication Sig Start Date End Date Taking? Authorizing Provider  acyclovir ointment (ZOVIRAX) 5 % APPLY ONE APPLICATION TOPICALLY EVERY THREE HOURS 07/24/17  Yes Wardell Honour, MD  albuterol (VENTOLIN HFA) 108 (90 Base) MCG/ACT inhaler INHALE TWO PUFFS BY MOUTH EVERY FOUR HOURS AS NEEDED FOR WHEEZING, SHORTNESS OF BREATH OR COUGH 07/24/17  Yes Wardell Honour, MD  Azelastine HCl 0.15 % SOLN Place 2 sprays into both nostrils 2 (two) times daily. 07/24/17  Yes Wardell Honour, MD  BELSOMRA 20 MG TABS TAKE 1 TABLET BY MOUTH AT BEDTIME 06/26/17  Yes Wardell Honour, MD  EPINEPHrine 0.3 mg/0.3 mL IJ SOAJ injection Inject 0.3 mLs (0.3 mg total) into the muscle once. 10/24/14  Yes Darlyne Russian, MD  estradiol (VIVELLE-DOT) 0.075 MG/24HR APPLY 1 PATCH ONTO THE SKIN2 TIMES A WEEK 07/22/17  Yes Fontaine, Belinda Block, MD    estradiol (VIVELLE-DOT) 0.075 MG/24HR Apply one patch onto the skin twice weekly 09/23/17  Yes Fontaine, Belinda Block, MD  ezetimibe (ZETIA) 10 MG tablet TAKE 1 TABLET DAILY 10/11/17  Yes Wardell Honour, MD  lansoprazole (PREVACID SOLUTAB) 30 MG disintegrating tablet FOR DIRECTIONS ON HOW TO   TAKE THIS MEDICINE, READ   THE ENCLOSED MEDICATION    INFORMATION FORM 11/29/17  Yes Wardell Honour, MD  metoprolol succinate (TOPROL-XL) 25 MG 24 hr tablet TAKE 1 TABLET EVERY MORNING, IF BLOOD PRESSURE STILL ELEVATED TAKE 1 TABLET IN AFTERNOON 07/24/17  Yes Wardell Honour, MD  metoprolol succinate (TOPROL-XL) 25 MG 24 hr tablet TAKE 1 TABLET EVERY        MORNING, IF BLOOD PRESSURE STILL ELEVATED TAKE 1      TABLET IN AFTERNOON 10/11/17  Yes Wardell Honour, MD  ondansetron (ZOFRAN-ODT) 4 MG disintegrating tablet Take 1 tablet (4 mg total) by mouth every 8 (eight) hours as needed for nausea or vomiting. 04/21/17  Yes Wardell Honour, MD  PREVACID SOLUTAB 30 MG disintegrating tablet Take 1 tablet (30 mg total) by mouth 2 (two) times daily. 07/05/17  Yes Wardell Honour, MD  rosuvastatin (CRESTOR) 20 MG tablet Take 1 tablet (20 mg total) by mouth daily. 07/24/17  Yes Wardell Honour, MD  SYNTHROID 88 MCG tablet Take 1 tablet (88  mcg total) by mouth daily before breakfast. 07/24/17  Yes Wardell Honour, MD  traZODone (DESYREL) 50 MG tablet TAKE 1-2 TABLETS (50-100 MG TOTAL) BY MOUTH AT BEDTIME AS NEEDED FOR SLEEP. 09/20/17  Yes Wardell Honour, MD  valACYclovir (VALTREX) 500 MG tablet TAKE ONE TABLET BY MOUTH TWICE DAILY 08/03/16  Yes Fontaine, Belinda Block, MD  vitamin B-12 (CYANOCOBALAMIN) 1000 MCG tablet Take 1,000 mcg by mouth daily.     Yes [provider]     Allergies  Allergen Reactions  . Clarithromycin Anaphylaxis  . Lunesta [Eszopiclone] Other (See Comments)    Causes sleep walking events  . Nutritional Supplements Anaphylaxis  . Pneumococcal Vaccines Anaphylaxis  . Sulfamethoxazole-Trimethoprim  Anaphylaxis  . Tetanus Toxoids Swelling  . Wellbutrin [Bupropion Hcl] Other (See Comments)    seizure  . Zolpidem     Causes sleep walking events  . Bactrim Hives and Other (See Comments)    Severe headache  . Clarithromycin Itching and Other (See Comments)    Severe headache  . Doxycycline Other (See Comments)    Severe GI upset  . Hydone [Chlorthalidone] Other (See Comments)    Vasculitis   . Ranitidine Other (See Comments)    Acts like a diuretic   . Reglan [Metoclopramide] Other (See Comments)    Tremors and ticks, possible permanence of effects  . Sulfa Antibiotics Hives and Other (See Comments)    Severe headache  . Tetracyclines & Related Other (See Comments)    Severe GI upset       Objective:  Physical Exam  Constitutional: She is oriented to person, place, and time. She appears well-developed and well-nourished. She is active and cooperative. No distress.  BP 118/74   Pulse 95   Temp 98.8 F (37.1 C) (Oral)   Resp 18   Ht 5' 3.39" (1.61 m)   LMP 05/23/2006   SpO2 98%   BMI 24.15 kg/m   HENT:  Head: Normocephalic and atraumatic.  Right Ear: Hearing, tympanic membrane, external ear and ear canal normal.  Left Ear: Hearing, tympanic membrane, external ear and ear canal normal.  Nose: Nose normal.  Mouth/Throat: Uvula is midline, oropharynx is clear and moist and mucous membranes are normal. No oropharyngeal exudate.  Eyes: Conjunctivae are normal. No scleral icterus.  Neck: Normal range of motion, full passive range of motion without pain and phonation normal. Neck supple. No thyromegaly present.  Cardiovascular: Normal rate, regular rhythm and normal heart sounds.  Pulses:      Radial pulses are 2+ on the right side, and 2+ on the left side.  Pulmonary/Chest: Effort normal and breath sounds normal.  Lymphadenopathy:       Head (right side): No tonsillar, no preauricular, no posterior auricular and no occipital adenopathy present.       Head (left side): No  tonsillar, no preauricular, no posterior auricular and no occipital adenopathy present.    She has no cervical adenopathy.       Right: No supraclavicular adenopathy present.       Left: No supraclavicular adenopathy present.  Neurological: She is alert and oriented to person, place, and time. No sensory deficit.  Skin: Skin is warm, dry and intact. No rash noted. No cyanosis or erythema. Nails show no clubbing.  Psychiatric: She has a normal mood and affect. Her speech is normal and behavior is normal.      Assessment & Plan:   1. Moderate persistent asthma without complication 2. Cough Continue with rest  and hydration. Oral steroid taper and PRN hydrocodone-containing cough suppressant. - predniSONE (DELTASONE) 20 MG tablet; Take 3 PO QAM x3days, 2 PO QAM x3days, 1 PO QAM x3days  Dispense: 18 tablet; Refill: 0 - chlorpheniramine-HYDROcodone (TUSSIONEX PENNKINETIC ER) 10-8 MG/5ML SUER; Take 5 mLs by mouth every 12 (twelve) hours as needed for cough.  Dispense: 100 mL; Refill: 0   Fara Chute, PA-C Certified Physician Assistant [place] at [facility]

## 2017-12-22 NOTE — Patient Instructions (Signed)
     IF you received an x-ray today, you will receive an invoice from [facility]. Please contact [facility] at [phone] with questions or concerns regarding your invoice.   IF you received labwork today, you will receive an invoice from [facility]. Please contact [facility] at [phone] with questions or concerns regarding your invoice.   Our billing staff will not be able to assist you with questions regarding bills from these companies.  You will be contacted with the lab results as soon as they are available. The fastest way to get your results is to activate your My Chart account. Instructions are located on the last page of this paperwork. If you have not heard from us regarding the results in 2 weeks, please contact this office.     

## 2018-01-11 ENCOUNTER — Ambulatory Visit: Payer: Commercial Managed Care - PPO | Admitting: Physician Assistant

## 2018-01-11 ENCOUNTER — Encounter: Payer: Self-pay | Admitting: Physician Assistant

## 2018-01-11 ENCOUNTER — Telehealth: Payer: Self-pay | Admitting: Physician Assistant

## 2018-01-11 ENCOUNTER — Other Ambulatory Visit: Payer: Self-pay

## 2018-01-11 ENCOUNTER — Ambulatory Visit (INDEPENDENT_AMBULATORY_CARE_PROVIDER_SITE_OTHER): Payer: Commercial Managed Care - PPO

## 2018-01-11 VITALS — BP 126/84 | HR 79 | Temp 98.4°F | Resp 18 | Ht 63.39 in

## 2018-01-11 DIAGNOSIS — R05 Cough: Secondary | ICD-10-CM

## 2018-01-11 DIAGNOSIS — R059 Cough, unspecified: Secondary | ICD-10-CM

## 2018-01-11 MED ORDER — HYDROCOD POLST-CPM POLST ER 10-8 MG/5ML PO SUER: 5.0000 mL | Freq: Two times a day (BID) | ORAL | 0 refills | Status: DC | PRN

## 2018-01-11 NOTE — Telephone Encounter (Signed)
Provided normal CXR result.

## 2018-01-11 NOTE — Patient Instructions (Addendum)
We recommend that you schedule a mammogram for breast cancer screening. Typically, you do not need a referral to do this. Please contact a local imaging center to schedule your mammogram.   Hospital - [phone]  *ask for the Radiology Department [facility]) - [phone] or [phone]  [facility] - [phone] [facility] - [phone] [facility] - [phone]  *ask for the Radiology Department [facility] - [phone]  *ask for the Radiology Department [facility] - [phone]  *ask for the Mammography Department [facility] - [phone]     IF you received an x-ray today, you will receive an invoice from [facility]. Please contact  Radiology at [phone] with questions or concerns regarding your invoice.   IF you received labwork today, you will receive an invoice from [facility]. Please contact [facility] at [phone] with questions or concerns regarding your invoice.   Our billing staff will not be able to assist you with questions regarding bills from these companies.  You will be contacted with the lab results as soon as they are available. The fastest way to get your results is to activate your My Chart account. Instructions are located on the last page of this paperwork. If you have not heard from us regarding the results in 2 weeks, please contact this office.      

## 2018-01-11 NOTE — Progress Notes (Signed)
Patient ID: Michelle [name], female    DOB: 07-Jun-1961, 57 y.o.   MRN: [id number]  PCP: Wardell Honour, MD  Chief Complaint  Patient presents with  . Cough    x1 month, Pt states cough from last visit isn't any better. Pt states she is coughing up clear and white phlegm. Pt states cough syrup helped her sleep at night but the prednisone didn't help at all. Pt states she tried her husband's z-pack and has one more day .  Marland Kitchen Follow-up    Subjective:   Presents for evaluation of cough x 4 weeks.  Cough persists. Started in early January.  Completed prednisone taper. Had some nausea. No change in cough. Cough syrup helps, but she ran out. Her husband had a Zpak that he never used. She started it 4 days ago, one dose remains. Last CXR was 06/2017.  Younger brother just had 2V CABG, age 36.  Increased reflux with coughing. No fever, chills, HA, dizziness. No GU symptoms.  Review of Systems As above.    Patient Active Problem List   Diagnosis Date Noted  . Insomnia 08/25/2016  . Abnormal brain MRI 11/07/2015  . Cough 04/02/2013  . Abnormal CXR 04/02/2013  . Leukocytosis 10/18/2012  . History of anaphylaxis 03/01/2012  . Depression 10/30/2011  . H/O suicide attempt 10/30/2011  . Hx of thyroid cancer   . Hypothyroidism   . Tachycardia   . Asthma   . Gastroparesis   . Hypercholesteremia   . VAIN (vaginal intraepithelial neoplasia)   . GERD (gastroesophageal reflux disease) 07/16/2011     Prior to Admission medications   Medication Sig Start Date End Date Taking? Authorizing Provider  acyclovir ointment (ZOVIRAX) 5 % APPLY ONE APPLICATION TOPICALLY EVERY THREE HOURS 07/24/17  Yes Wardell Honour, MD  albuterol (VENTOLIN HFA) 108 (90 Base) MCG/ACT inhaler INHALE TWO PUFFS BY MOUTH EVERY FOUR HOURS AS NEEDED FOR WHEEZING, SHORTNESS OF BREATH OR COUGH 07/24/17  Yes Wardell Honour, MD  BELSOMRA 20 MG TABS TAKE 1 TABLET BY MOUTH AT BEDTIME 06/26/17  Yes Wardell Honour, MD    EPINEPHrine 0.3 mg/0.3 mL IJ SOAJ injection Inject 0.3 mLs (0.3 mg total) into the muscle once. 10/24/14  Yes Darlyne Russian, MD  estradiol (VIVELLE-DOT) 0.075 MG/24HR Apply one patch onto the skin twice weekly 09/23/17  Yes Fontaine, Belinda Block, MD  ezetimibe (ZETIA) 10 MG tablet TAKE 1 TABLET DAILY 10/11/17  Yes Wardell Honour, MD  lansoprazole (PREVACID SOLUTAB) 30 MG disintegrating tablet FOR DIRECTIONS ON HOW TO   TAKE THIS MEDICINE, READ   THE ENCLOSED MEDICATION    INFORMATION FORM 11/29/17  Yes Wardell Honour, MD  metoprolol succinate (TOPROL-XL) 25 MG 24 hr tablet TAKE 1 TABLET EVERY        MORNING, IF BLOOD PRESSURE STILL ELEVATED TAKE 1      TABLET IN AFTERNOON 10/11/17  Yes Wardell Honour, MD  ondansetron (ZOFRAN-ODT) 4 MG disintegrating tablet Take 1 tablet (4 mg total) by mouth every 8 (eight) hours as needed for nausea or vomiting. 04/21/17  Yes Wardell Honour, MD  rosuvastatin (CRESTOR) 20 MG tablet Take 1 tablet (20 mg total) by mouth daily. 07/24/17  Yes Wardell Honour, MD  SYNTHROID 88 MCG tablet Take 1 tablet (88 mcg total) by mouth daily before breakfast. 07/24/17  Yes Wardell Honour, MD  traZODone (DESYREL) 50 MG tablet TAKE 1-2 TABLETS (50-100 MG TOTAL) BY MOUTH AT BEDTIME  AS NEEDED FOR SLEEP. 09/20/17  Yes Wardell Honour, MD  valACYclovir (VALTREX) 500 MG tablet TAKE ONE TABLET BY MOUTH TWICE DAILY 08/03/16  Yes Fontaine, Belinda Block, MD  Azelastine HCl 0.15 % SOLN Place 2 sprays into both nostrils 2 (two) times daily. Patient not taking: Reported on 01/11/2018 07/24/17   Wardell Honour, MD  chlorpheniramine-HYDROcodone [facility]) 10-8 MG/5ML SUER Take 5 mLs by mouth every 12 (twelve) hours as needed for cough. Patient not taking: Reported on 01/11/2018 12/22/17   Harrison Mons, PA-C  predniSONE (DELTASONE) 20 MG tablet Take 3 PO QAM x3days, 2 PO QAM x3days, 1 PO QAM x3days Patient not taking: Reported on 01/11/2018 12/22/17   Harrison Mons, PA-C  PREVACID SOLUTAB 30  MG disintegrating tablet Take 1 tablet (30 mg total) by mouth 2 (two) times daily. Patient not taking: Reported on 01/11/2018 07/05/17   Wardell Honour, MD  vitamin B-12 (CYANOCOBALAMIN) 1000 MCG tablet Take 1,000 mcg by mouth daily.      [provider]     Allergies  Allergen Reactions  . Clarithromycin Anaphylaxis  . Lunesta [Eszopiclone] Other (See Comments)    Causes sleep walking events  . Nutritional Supplements Anaphylaxis  . Pneumococcal Vaccines Anaphylaxis  . Sulfamethoxazole-Trimethoprim Anaphylaxis  . Tetanus Toxoids Swelling  . Wellbutrin [Bupropion Hcl] Other (See Comments)    seizure  . Zolpidem     Causes sleep walking events  . Bactrim Hives and Other (See Comments)    Severe headache  . Clarithromycin Itching and Other (See Comments)    Severe headache  . Doxycycline Other (See Comments)    Severe GI upset  . Hydone [Chlorthalidone] Other (See Comments)    Vasculitis   . Ranitidine Other (See Comments)    Acts like a diuretic   . Reglan [Metoclopramide] Other (See Comments)    Tremors and ticks, possible permanence of effects  . Sulfa Antibiotics Hives and Other (See Comments)    Severe headache  . Tetracyclines & Related Other (See Comments)    Severe GI upset       Objective:  Physical Exam  Constitutional: She is oriented to person, place, and time. She appears well-developed and well-nourished. She is active and cooperative. No distress.  BP 126/84 (BP Location: Right Arm, Patient Position: Sitting, Cuff Size: Normal)   Pulse 79   Temp 98.4 F (36.9 C) (Oral)   Resp 18   Ht 5' 3.39" (1.61 m)   LMP 05/23/2006   SpO2 98%   BMI 24.15 kg/m   HENT:  Head: Normocephalic and atraumatic.  Right Ear: Hearing normal.  Left Ear: Hearing normal.  Eyes: Conjunctivae are normal. No scleral icterus.  Neck: Normal range of motion. Neck supple. No thyromegaly present.  Cardiovascular: Normal rate, regular rhythm and normal heart sounds.   Pulses:      Radial pulses are 2+ on the right side, and 2+ on the left side.  Pulmonary/Chest: Effort normal and breath sounds normal.  Non-productive cough during interview and exam.  Lymphadenopathy:       Head (right side): No tonsillar, no preauricular, no posterior auricular and no occipital adenopathy present.       Head (left side): No tonsillar, no preauricular, no posterior auricular and no occipital adenopathy present.    She has no cervical adenopathy.       Right: No supraclavicular adenopathy present.       Left: No supraclavicular adenopathy present.  Neurological: She is alert and  oriented to person, place, and time. No sensory deficit.  Skin: Skin is warm, dry and intact. No rash noted. No cyanosis or erythema. Nails show no clubbing.  Psychiatric: She has a normal mood and affect. Her speech is normal and behavior is normal.           Assessment & Plan:   Problem List Items Addressed This Visit    Cough - Primary    Likely post-viral cough. Update CXR. Refill Tussionex.      Relevant Medications   chlorpheniramine-HYDROcodone (TUSSIONEX PENNKINETIC ER) 10-8 MG/5ML SUER   Other Relevant Orders   DG Chest 2 View (Completed)       Return if symptoms worsen or fail to improve.   Fara Chute, PA-C [facility] at [place]

## 2018-01-15 ENCOUNTER — Ambulatory Visit: Payer: Commercial Managed Care - PPO | Admitting: Physician Assistant

## 2018-01-19 ENCOUNTER — Other Ambulatory Visit: Payer: Self-pay | Admitting: Family Medicine

## 2018-01-19 NOTE — Assessment & Plan Note (Signed)
Likely post-viral cough. Update CXR. Refill Tussionex.

## 2018-01-28 ENCOUNTER — Other Ambulatory Visit: Payer: Self-pay | Admitting: Family Medicine

## 2018-01-28 DIAGNOSIS — G4700 Insomnia, unspecified: Secondary | ICD-10-CM

## 2018-01-31 NOTE — Telephone Encounter (Signed)
LOV 01/11/18 Dr. Reginia Forts Refill of Belsomra

## 2018-02-01 NOTE — Telephone Encounter (Signed)
Call ---- I refilled Belsomra for 30 days only.  Patient is due for six month follow-up with me for blood pressure, cholesterol, insomnia, .  Please schedule follow-up OV with me in upcoming month.

## 2018-02-06 ENCOUNTER — Other Ambulatory Visit: Payer: Self-pay | Admitting: Family Medicine

## 2018-02-06 DIAGNOSIS — F5101 Primary insomnia: Secondary | ICD-10-CM

## 2018-02-07 NOTE — Telephone Encounter (Signed)
LOV: 07/09/17  Dr. Tamala Julian  [facility] [place], [phone] [facility]

## 2018-02-08 NOTE — Telephone Encounter (Signed)
Call --- I refilled Trazodone for one month; patient is due for six month follow-up of hypertension, hypercholesterolemia, insomnia, hypothyroidism. Please schedule.

## 2018-02-22 ENCOUNTER — Other Ambulatory Visit: Payer: Self-pay | Admitting: Gynecology

## 2018-02-23 ENCOUNTER — Telehealth: Payer: Self-pay | Admitting: Family Medicine

## 2018-02-23 NOTE — Telephone Encounter (Signed)
Copied from [place]. Topic: Quick Communication - Rx Refill/Question >> Feb 23, 2018  5:26 PM Arletha Grippe wrote: Medication: med for yeast infection  Has the patient contacted their pharmacy? No. (Agent: If no, request that the patient contact the pharmacy for the refill.) Preferred Pharmacy (with phone number [facility] street [facility] [place] on [address]  Agent: Please be advised that RX refills may take up to 3 business days. We ask that you follow-up with your pharmacy.  Pt thinks that previous medication has caused to get yeast infec

## 2018-02-24 NOTE — Telephone Encounter (Signed)
Spoke with patient advised she would need to be seen for a prescription to be called in.  Patient stated she has an appointment on 4/6

## 2018-02-26 ENCOUNTER — Ambulatory Visit: Payer: Commercial Managed Care - PPO | Admitting: Family Medicine

## 2018-02-26 VITALS — BP 146/86 | HR 99 | Temp 98.4°F | Resp 16 | Ht 63.0 in

## 2018-02-26 DIAGNOSIS — R000 Tachycardia, unspecified: Secondary | ICD-10-CM | POA: Diagnosis not present

## 2018-02-26 DIAGNOSIS — K219 Gastro-esophageal reflux disease without esophagitis: Secondary | ICD-10-CM

## 2018-02-26 DIAGNOSIS — B3731 Acute candidiasis of vulva and vagina: Secondary | ICD-10-CM

## 2018-02-26 DIAGNOSIS — E890 Postprocedural hypothyroidism: Secondary | ICD-10-CM | POA: Diagnosis not present

## 2018-02-26 DIAGNOSIS — F5104 Psychophysiologic insomnia: Secondary | ICD-10-CM | POA: Diagnosis not present

## 2018-02-26 DIAGNOSIS — B373 Candidiasis of vulva and vagina: Secondary | ICD-10-CM

## 2018-02-26 DIAGNOSIS — K3184 Gastroparesis: Secondary | ICD-10-CM | POA: Diagnosis not present

## 2018-02-26 DIAGNOSIS — Z85850 Personal history of malignant neoplasm of thyroid: Secondary | ICD-10-CM

## 2018-02-26 DIAGNOSIS — E7800 Pure hypercholesterolemia, unspecified: Secondary | ICD-10-CM

## 2018-02-26 MED ORDER — SUVOREXANT 20 MG PO TABS: 1.0000 | ORAL_TABLET | Freq: Every day | ORAL | 5 refills | Status: DC

## 2018-02-26 MED ORDER — TRAZODONE HCL 50 MG PO TABS: 50.0000 mg | ORAL_TABLET | Freq: Every evening | ORAL | 0 refills | Status: DC | PRN

## 2018-02-26 MED ORDER — FLUCONAZOLE 150 MG PO TABS: 150.0000 mg | ORAL_TABLET | Freq: Once | ORAL | 5 refills | Status: AC

## 2018-02-26 MED ORDER — ONDANSETRON 4 MG PO TBDP: 4.0000 mg | ORAL_TABLET | Freq: Three times a day (TID) | ORAL | 1 refills | Status: AC | PRN

## 2018-02-26 MED ORDER — ALBUTEROL SULFATE HFA 108 (90 BASE) MCG/ACT IN AERS: INHALATION_SPRAY | RESPIRATORY_TRACT | 1 refills | Status: AC

## 2018-02-26 MED ORDER — ROSUVASTATIN CALCIUM 20 MG PO TABS: 20.0000 mg | ORAL_TABLET | Freq: Every day | ORAL | 1 refills | Status: DC

## 2018-02-26 MED ORDER — EZETIMIBE 10 MG PO TABS: 10.0000 mg | ORAL_TABLET | Freq: Every day | ORAL | 1 refills | Status: AC

## 2018-02-26 NOTE — Patient Instructions (Signed)
     IF you received an x-ray today, you will receive an invoice from [facility]. Please contact [facility] at [phone] with questions or concerns regarding your invoice.   IF you received labwork today, you will receive an invoice from [facility]. Please contact [facility] at [phone] with questions or concerns regarding your invoice.   Our billing staff will not be able to assist you with questions regarding bills from these companies.  You will be contacted with the lab results as soon as they are available. The fastest way to get your results is to activate your My Chart account. Instructions are located on the last page of this paperwork. If you have not heard from us regarding the results in 2 weeks, please contact this office.     

## 2018-02-26 NOTE — Progress Notes (Signed)
Subjective:    Patient ID: Michelle [name], female    DOB: 1961/01/01, 57 y.o.   MRN: [id number]  02/26/2018  Vaginal Discharge (X 3 DAYS/ pt would like to send script to health dept, as regimen); Medication Refill (all, some meds are through mail order service); and Weight Check (pt refused to get on scale)    HPI This 57 y.o. female presents for SIX MONTH follow-up of hypertension, hypercholesterolemia, hypothyroidism, GERD, insomnia.  Patient reports good compliance with medication, good tolerance to medication, and good symptom control.    Vaginal discharge: requesting Diflucan for yeast infection.  Recent antibiotics; saw PA Jeffery on 12/22/17 and 01/11/18.   Really excited about upcoming adoption of second child.  He continues to suffer with insomnia doing to poor sleep at current child.  BP Readings from Last 3 Encounters:  03/15/18 122/72  02/26/18 (!) 146/86  01/11/18 126/84   Wt Readings from Last 3 Encounters:  07/22/17 138 lb (62.6 kg)  07/18/16 137 lb (62.1 kg)  06/24/16 140 lb (63.5 kg)   Immunization History  Administered Date(s) Administered  . Influenza Split 08/24/2015  . Influenza,inj,Quad PF,6+ Mos 09/13/2017  . Influenza-Unspecified 08/15/2016  . PPD Test 12/15/2015    Review of Systems  Constitutional: Negative for chills, diaphoresis, fatigue and fever.  Eyes: Negative for visual disturbance.  Respiratory: Negative for cough and shortness of breath.   Cardiovascular: Negative for chest pain, palpitations and leg swelling.  Gastrointestinal: Negative for abdominal pain, constipation, diarrhea, nausea and vomiting.  Endocrine: Negative for cold intolerance, heat intolerance, polydipsia, polyphagia and polyuria.  Genitourinary: Positive for vaginal discharge. Negative for dyspareunia, dysuria, enuresis, flank pain, frequency, genital sores, hematuria, pelvic pain, urgency, vaginal bleeding and vaginal pain.  Neurological: Negative for dizziness, tremors,  seizures, syncope, facial asymmetry, speech difficulty, weakness, light-headedness, numbness and headaches.  Psychiatric/Behavioral: Positive for sleep disturbance. Negative for dysphoric mood, self-injury and suicidal ideas. The patient is nervous/anxious.     Past Medical History:  Diagnosis Date  . Allergy   . Anaphylaxis   . Asthma   . Depression   . Gastroparesis   . Gastroparesis    history of  . GERD (gastroesophageal reflux disease)   . Headache(784.0)    migraines  . Hypercholesteremia   . Hypothyroidism   . Osteoarthritis   . Osteopenia 06/2013   T score -1.2 FRAX 8.6%/0.6%  . Overdose, drug 10/30/2011  . PONV (postoperative nausea and vomiting)   . Reflux   . Seizures ([place])    1 seizure secondary to wellbutrin-none since  . Thyroid cancer ([place])    papillary  . VAIN I (vaginal intraepithelial neoplasia grade I) 09/2013, 11/2014    with positive high-risk HPV screen    Past Surgical History:  Procedure Laterality Date  . BREAST SURGERY     reduc tion mammoplasty  . CHOLECYSTECTOMY  2004  . COLPOSCOPY  02/2012   VAIN I  . COSMETIC SURGERY     buttock liposuction  . HERNIA REPAIR  2012  . NISSEN FUNDOPLICATION  7829, 5621   REFLUX  . THYROID SURGERY  2007   for thyroid cancer  . UPPER GASTROINTESTINAL ENDOSCOPY  08/26/2004  . VAGINAL HYSTERECTOMY  2007   irregular bleeding  . VARICOSE VEIN SURGERY  03/20/2003   right leg   Allergies  Allergen Reactions  . Clarithromycin Anaphylaxis  . Lunesta [Eszopiclone] Other (See Comments)    Causes sleep walking events  . Nutritional Supplements Anaphylaxis  . Pneumococcal Vaccines  Anaphylaxis  . Sulfamethoxazole-Trimethoprim Anaphylaxis  . Tetanus Toxoids Swelling  . Wellbutrin [Bupropion Hcl] Other (See Comments)    seizure  . Zolpidem     Causes sleep walking events  . Bactrim Hives and Other (See Comments)    Severe headache  . Clarithromycin Itching and Other (See Comments)    Severe headache  .  Doxycycline Other (See Comments)    Severe GI upset  . Hydone [Chlorthalidone] Other (See Comments)    Vasculitis   . Ranitidine Other (See Comments)    Acts like a diuretic   . Reglan [Metoclopramide] Other (See Comments)    Tremors and ticks, possible permanence of effects  . Sulfa Antibiotics Hives and Other (See Comments)    Severe headache  . Tetracyclines & Related Other (See Comments)    Severe GI upset   Current Outpatient Medications on File Prior to Visit  Medication Sig Dispense Refill  . acyclovir ointment (ZOVIRAX) 5 % APPLY ONE APPLICATION TOPICALLY EVERY THREE HOURS 30 g 2  . EPINEPHrine 0.3 mg/0.3 mL IJ SOAJ injection Inject 0.3 mLs (0.3 mg total) into the muscle once. 2 Device 2  . estradiol (VIVELLE-DOT) 0.075 MG/24HR Apply one patch onto the skin twice weekly 24 patch 2  . lansoprazole (PREVACID SOLUTAB) 30 MG disintegrating tablet FOR DIRECTIONS ON HOW TO   TAKE THIS MEDICINE, READ   THE ENCLOSED MEDICATION    INFORMATION FORM 180 tablet 3  . metoprolol succinate (TOPROL-XL) 25 MG 24 hr tablet TAKE 1 TABLET EVERY        MORNING, IF BLOOD PRESSURE STILL ELEVATED TAKE 1      TABLET IN AFTERNOON 180 tablet 2  . PREVACID SOLUTAB 30 MG disintegrating tablet Take 1 tablet (30 mg total) by mouth 2 (two) times daily. 180 tablet 3  . SYNTHROID 88 MCG tablet Take 1 tablet (88 mcg total) by mouth daily before breakfast. 90 tablet 3  . valACYclovir (VALTREX) 500 MG tablet TAKE ONE TABLET BY MOUTH TWICE DAILY 20 tablet 3  . vitamin B-12 (CYANOCOBALAMIN) 1000 MCG tablet Take 1,000 mcg by mouth daily.      . Azelastine HCl 0.15 % SOLN Place 2 sprays into both nostrils 2 (two) times daily. 30 mL 3  . predniSONE (DELTASONE) 20 MG tablet Take 3 PO QAM x3days, 2 PO QAM x3days, 1 PO QAM x3days 18 tablet 0   Current Facility-Administered Medications on File Prior to Visit  Medication Dose Route Frequency Provider Last Rate Last Dose  . gadopentetate dimeglumine (MAGNEVIST) injection 15 mL   15 mL Intravenous Once PRN Penumalli, Earlean Polka, MD       Social History   Socioeconomic History  . Marital status: Married    Spouse name: [name]  . Number of children: 2  . Years of education: College  . Highest education level: Not on file  Occupational History  . Occupation: homemaker  Social Needs  . Financial resource strain: Not on file  . Food insecurity:    Worry: Not on file    Inability: Not on file  . Transportation needs:    Medical: Not on file    Non-medical: Not on file  Tobacco Use  . Smoking status: Never Smoker  . Smokeless tobacco: Never Used  Substance and Sexual Activity  . Alcohol use: No    Alcohol/week: 0.0 oz  . Drug use: No  . Sexual activity: Yes    Partners: Male    Birth control/protection: Surgical    Comment:  1st intercourse 22 yo-1 partner-HYST  Lifestyle  . Physical activity:    Days per week: Not on file    Minutes per session: Not on file  . Stress: Not on file  Relationships  . Social connections:    Talks on phone: Not on file    Gets together: Not on file    Attends religious service: Not on file    Active member of club or organization: Not on file    Attends meetings of clubs or organizations: Not on file    Relationship status: Not on file  . Intimate partner violence:    Fear of current or ex partner: Not on file    Emotionally abused: Not on file    Physically abused: Not on file    Forced sexual activity: Not on file  Other Topics Concern  . Not on file  Social History Narrative   Marital status: married x 36 years      Children: 2 children (1 biological son 49 years old; 63 adopted daughter 73 years old); no grandchildren.      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      Tobacco:  None      Alcohol:  Rarely      Drugs: none      Exercise: Yes. Walking daily around neighborhood.      Receiving therapy with Vivia Budge.   Son lives locally.    Adopted daughter from [place] 11/2016.   Family History  Problem Relation Age of Onset  . Heart disease Mother 91       CABG x 2  . Hypertension Mother   . Hyperlipidemia Mother   . Heart disease Father 53       AMI as cause of death  . Hypertension Father   . Emphysema Father 22       was a smoker  . COPD Father   . Heart disease Sister   . Hypertension Sister   . Spina bifida Sister   . Diabetes Paternal Grandmother   . Multiple sclerosis Other   . Cancer Maternal Grandmother   . Heart disease Brother   . Hypertension Brother   . Leukemia Maternal Grandfather   . Peripheral Artery Disease Sister   . Hypertension Brother   . Arthritis Brother   . Hypertension Brother   . Stroke Brother   . Hypertension Brother   . Heart disease Brother 8       2V CABG  . Hypertension Brother   . Cancer Maternal Aunt        leukemia       Objective:    BP (!) 146/86   Pulse 99   Temp 98.4 F (36.9 C) (Oral)   Resp 16   Ht 5\' 3"  (1.6 m)   LMP 05/23/2006   SpO2 96%   BMI 24.45 kg/m   Physical Exam  Constitutional: She is oriented to person, place, and time. She appears well-developed and well-nourished. No distress.  HENT:  Head: Normocephalic and atraumatic.  Right Ear: External ear normal.  Left Ear: External ear normal.  Nose: Nose normal.  Mouth/Throat: Oropharynx is clear and moist.  Eyes: Pupils are equal, round, and reactive to light. Conjunctivae and EOM are normal.  Neck: Normal range of motion. Neck supple. Carotid bruit is not present. No thyromegaly present.  Cardiovascular: Normal rate, regular rhythm, normal heart sounds and intact distal pulses. Exam reveals no  gallop and no friction rub.  No murmur heard. Pulmonary/Chest: Effort normal and breath sounds normal. She has no wheezes. She has no rales.  Abdominal: Soft. Bowel sounds are normal. She exhibits no distension and no mass. There is no tenderness. There is no rebound and no guarding.  Lymphadenopathy:      She has no cervical adenopathy.  Neurological: She is alert and oriented to person, place, and time. No cranial nerve deficit.  Skin: Skin is warm and dry. No rash noted. She is not diaphoretic. No erythema. No pallor.  Psychiatric: She has a normal mood and affect. Her behavior is normal.   No results found. Depression screen [facility] 2/9 03/15/2018 01/11/2018 12/22/2017 08/30/2017 07/10/2017  Decreased Interest 0 0 0 0 0  Down, Depressed, Hopeless 0 0 0 0 0  PHQ - 2 Score 0 0 0 0 0  Altered sleeping - - - - -  Tired, decreased energy - - - - -  Change in appetite - - - - -  Feeling bad or failure about yourself  - - - - -  Trouble concentrating - - - - -  Moving slowly or fidgety/restless - - - - -  Suicidal thoughts - - - - -  PHQ-9 Score - - - - -  Some recent data might be hidden   Fall Risk  03/15/2018 01/11/2018 12/22/2017 08/30/2017 07/10/2017  Falls in the past year? No No No No No  Number falls in past yr: - - - - -  Injury with Fall? - - - - -  [place] for fall due to : - - - - -  Risk for fall due to: Comment - - - - -        Assessment & Plan:   1. Postoperative hypothyroidism   2. Hx of thyroid cancer   3. Hypercholesteremia   4. Tachycardia   5. Gastroesophageal reflux disease without esophagitis   6. Psychophysiological insomnia   7. Candidiasis of vagina    Controlled chronic medical conditions including hypothyroidism secondary to thyroid cancer, tachycardia, hypertension, GERD, insomnia.  Obtain labs for chronic disease management.  Refills provided.  Candidiasis vaginal: New onset.  Diflucan sent to pharmacy.  Recent antibiotic usage.  No vaginal exam performed.  If persistent, return for vaginal exam.   Orders Placed This Encounter  Procedures  . CBC with Differential/Platelet  . Comprehensive metabolic panel    Order Specific Question:   Has the patient fasted?    Answer:   No  . TSH  . T4, free  . Lipid panel    Order Specific  Question:   Has the patient fasted?    Answer:   No   Meds ordered this encounter  Medications  . fluconazole (DIFLUCAN) 150 MG tablet    Sig: Take 1 tablet (150 mg total) by mouth once for 1 dose. Repeat if needed    Dispense:  2 tablet    Refill:  5  . albuterol (VENTOLIN HFA) 108 (90 Base) MCG/ACT inhaler    Sig: INHALE TWO PUFFS BY MOUTH EVERY FOUR HOURS AS NEEDED FOR WHEEZING, SHORTNESS OF BREATH OR COUGH    Dispense:  18 Inhaler    Refill:  1  . Suvorexant (BELSOMRA) 20 MG TABS    Sig: Take 1 tablet by mouth at bedtime.    Dispense:  30 tablet    Refill:  5    This request is for  a new prescription for a controlled substance as required by Federal/State law..  . ezetimibe (ZETIA) 10 MG tablet    Sig: Take 1 tablet (10 mg total) by mouth daily.    Dispense:  90 tablet    Refill:  1  . ondansetron (ZOFRAN-ODT) 4 MG disintegrating tablet    Sig: Take 1 tablet (4 mg total) by mouth every 8 (eight) hours as needed for nausea or vomiting.    Dispense:  90 tablet    Refill:  1    PLACE 1 TABLET ON THE TONGUE AND ALLOW TO DISSOLVE EVERY 8 HOURS AS NEEDED FOR NAUSEA OR VOMITING  . rosuvastatin (CRESTOR) 20 MG tablet    Sig: Take 1 tablet (20 mg total) by mouth daily.    Dispense:  90 tablet    Refill:  1  . DISCONTD: traZODone (DESYREL) 50 MG tablet    Sig: Take 1-2 tablets (50-100 mg total) by mouth at bedtime as needed for sleep.    Dispense:  60 tablet    Refill:  0    Return in about 6 months (around 08/28/2018) for complete physical examiniation.   Lowella Kindley Elayne Guerin, M.D. Primary Care at [facility] [address] [place], [place]  [phone] phone [phone] fax

## 2018-02-27 LAB — LIPID PANEL
CHOLESTEROL TOTAL: 191 mg/dL (ref 100–199)
Chol/HDL Ratio: 3.4 ratio (ref 0.0–4.4)
HDL: 57 mg/dL (ref >39)
LDL Calculated: 76 mg/dL (ref 0–99)
TRIGLYCERIDES: 292 mg/dL — AB (ref 0–149)
VLDL Cholesterol Cal: 58 mg/dL — ABNORMAL HIGH (ref 5–40)

## 2018-02-27 LAB — COMPREHENSIVE METABOLIC PANEL
A/G RATIO: 1.9 (ref 1.2–2.2)
ALT: 24 IU/L (ref 0–32)
AST: 24 IU/L (ref 0–40)
Albumin: 4.5 g/dL (ref 3.5–5.5)
Alkaline Phosphatase: 57 IU/L (ref 39–117)
BILIRUBIN TOTAL: 0.7 mg/dL (ref 0.0–1.2)
BUN/Creatinine Ratio: 13 (ref 9–23)
BUN: 9 mg/dL (ref 6–24)
CALCIUM: 9.5 mg/dL (ref 8.7–10.2)
CHLORIDE: 101 mmol/L (ref 96–106)
CO2: 26 mmol/L (ref 20–29)
Creatinine, Ser: 0.71 mg/dL (ref 0.57–1.00)
GFR, EST AFRICAN AMERICAN: 109 mL/min/{1.73_m2} (ref >59)
GFR, EST NON AFRICAN AMERICAN: 95 mL/min/{1.73_m2} (ref >59)
GLUCOSE: 88 mg/dL (ref 65–99)
Globulin, Total: 2.4 g/dL (ref 1.5–4.5)
POTASSIUM: 4 mmol/L (ref 3.5–5.2)
Sodium: 140 mmol/L (ref 134–144)
TOTAL PROTEIN: 6.9 g/dL (ref 6.0–8.5)

## 2018-02-27 LAB — CBC WITH DIFFERENTIAL/PLATELET
Basophils Absolute: 0.1 10*3/uL (ref 0.0–0.2)
Basos: 0 %
EOS (ABSOLUTE): 0.4 10*3/uL (ref 0.0–0.4)
Eos: 3 %
Hematocrit: 43.6 % (ref 34.0–46.6)
Hemoglobin: 14.8 g/dL (ref 11.1–15.9)
IMMATURE GRANS (ABS): 0 10*3/uL (ref 0.0–0.1)
IMMATURE GRANULOCYTES: 0 %
LYMPHS: 36 %
Lymphocytes Absolute: 4.4 10*3/uL — ABNORMAL HIGH (ref 0.7–3.1)
MCH: 31.8 pg (ref 26.6–33.0)
MCHC: 33.9 g/dL (ref 31.5–35.7)
MCV: 94 fL (ref 79–97)
MONOS ABS: 0.9 10*3/uL (ref 0.1–0.9)
Monocytes: 7 %
NEUTROS PCT: 54 %
Neutrophils Absolute: 6.6 10*3/uL (ref 1.4–7.0)
PLATELETS: 281 10*3/uL (ref 150–379)
RBC: 4.65 x10E6/uL (ref 3.77–5.28)
RDW: 13.6 % (ref 12.3–15.4)
WBC: 12.4 10*3/uL — AB (ref 3.4–10.8)

## 2018-02-27 LAB — TSH: TSH: 0.317 u[IU]/mL — ABNORMAL LOW (ref 0.450–4.500)

## 2018-02-27 LAB — T4, FREE: Free T4: 1.38 ng/dL (ref 0.82–1.77)

## 2018-03-15 ENCOUNTER — Other Ambulatory Visit: Payer: Self-pay

## 2018-03-15 ENCOUNTER — Encounter: Payer: Self-pay | Admitting: Family Medicine

## 2018-03-15 ENCOUNTER — Ambulatory Visit: Payer: Commercial Managed Care - PPO | Admitting: Family Medicine

## 2018-03-15 VITALS — BP 122/72 | HR 71 | Temp 98.0°F | Resp 16 | Ht 63.0 in

## 2018-03-15 DIAGNOSIS — S239XXA Sprain of unspecified parts of thorax, initial encounter: Secondary | ICD-10-CM

## 2018-03-15 DIAGNOSIS — S63502A Unspecified sprain of left wrist, initial encounter: Secondary | ICD-10-CM

## 2018-03-15 MED ORDER — CYCLOBENZAPRINE HCL 10 MG PO TABS: 10.0000 mg | ORAL_TABLET | Freq: Three times a day (TID) | ORAL | 0 refills | Status: DC | PRN

## 2018-03-15 NOTE — Patient Instructions (Addendum)
   IF you received an x-ray today, you will receive an invoice from [facility]. Please contact [facility] at [phone] with questions or concerns regarding your invoice.   IF you received labwork today, you will receive an invoice from [facility]. Please contact [facility] at [phone] with questions or concerns regarding your invoice.   Our billing staff will not be able to assist you with questions regarding bills from these companies.  You will be contacted with the lab results as soon as they are available. The fastest way to get your results is to activate your My Chart account. Instructions are located on the last page of this paperwork. If you have not heard from us regarding the results in 2 weeks, please contact this office.      Cervical Strain and Sprain Rehab Ask your health care provider which exercises are safe for you. Do exercises exactly as told by your health care provider and adjust them as directed. It is normal to feel mild stretching, pulling, tightness, or discomfort as you do these exercises, but you should stop right away if you feel sudden pain or your pain gets worse.Do not begin these exercises until told by your health care provider. Stretching and range of motion exercises These exercises warm up your muscles and joints and improve the movement and flexibility of your neck. These exercises also help to relieve pain, numbness, and tingling. Exercise A: Cervical side bend  1. Using good posture, sit on a stable chair or stand up. 2. Without moving your shoulders, slowly tilt your left / right ear to your shoulder until you feel a stretch in your neck muscles. You should be looking straight ahead. 3. Hold for __________ seconds. 4. Repeat with the other side of your neck. Repeat __________ times. Complete this exercise __________ times a day. Exercise B: Cervical rotation  1. Using good posture, sit on a stable chair or stand  up. 2. Slowly turn your head to the side as if you are looking over your left / right shoulder. ? Keep your eyes level with the ground. ? Stop when you feel a stretch along the side and the back of your neck. 3. Hold for __________ seconds. 4. Repeat this by turning to your other side. Repeat __________ times. Complete this exercise __________ times a day. Exercise C: Thoracic extension and pectoral stretch 1. Roll a towel or a small blanket so it is about 4 inches (10 cm) in diameter. 2. Lie down on your back on a firm surface. 3. Put the towel lengthwise, under your spine in the middle of your back. It should not be not under your shoulder blades. The towel should line up with your spine from your middle back to your lower back. 4. Put your hands behind your head and let your elbows fall out to your sides. 5. Hold for __________ seconds. Repeat __________ times. Complete this exercise __________ times a day. Strengthening exercises These exercises build strength and endurance in your neck. Endurance is the ability to use your muscles for a long time, even after your muscles get tired. Exercise D: Upper cervical flexion, isometric 1. Lie on your back with a thin pillow behind your head and a small rolled-up towel under your neck. 2. Gently tuck your chin toward your chest and nod your head down to look toward your feet. Do not lift your head off the pillow. 3. Hold for __________ seconds. 4. Release the tension slowly. Relax your neck muscles   completely before you repeat this exercise. Repeat __________ times. Complete this exercise __________ times a day. Exercise E: Cervical extension, isometric  1. Stand about 6 inches (15 cm) away from a wall, with your back facing the wall. 2. Place a soft object, about 6-8 inches (15-20 cm) in diameter, between the back of your head and the wall. A soft object could be a small pillow, a ball, or a folded towel. 3. Gently tilt your head back and press  into the soft object. Keep your jaw and forehead relaxed. 4. Hold for __________ seconds. 5. Release the tension slowly. Relax your neck muscles completely before you repeat this exercise. Repeat __________ times. Complete this exercise __________ times a day. Posture and body mechanics  Body mechanics refers to the movements and positions of your body while you do your daily activities. Posture is part of body mechanics. Good posture and healthy body mechanics can help to relieve stress in your body's tissues and joints. Good posture means that your spine is in its natural S-curve position (your spine is neutral), your shoulders are pulled back slightly, and your head is not tipped forward. The following are general guidelines for applying improved posture and body mechanics to your everyday activities. Standing  When standing, keep your spine neutral and keep your feet about hip-width apart. Keep a slight bend in your knees. Your ears, shoulders, and hips should line up.  When you do a task in which you stand in one place for a long time, place one foot up on a stable object that is 2-4 inches (5-10 cm) high, such as a footstool. This helps keep your spine neutral. Sitting   When sitting, keep your spine neutral and your keep feet flat on the floor. Use a footrest, if necessary, and keep your thighs parallel to the floor. Avoid rounding your shoulders, and avoid tilting your head forward.  When working at a desk or a computer, keep your desk at a height where your hands are slightly lower than your elbows. Slide your chair under your desk so you are close enough to maintain good posture.  When working at a computer, place your monitor at a height where you are looking straight ahead and you do not have to tilt your head forward or downward to look at the screen. Resting When lying down and resting, avoid positions that are most painful for you. Try to support your neck in a neutral position.  You can use a contour pillow or a small rolled-up towel. Your pillow should support your neck but not push on it. This information is not intended to replace advice given to you by your health care provider. Make sure you discuss any questions you have with your health care provider. Document Released: 11/09/2005 Document Revised: 07/16/2016 Document Reviewed: 10/16/2015 Elsevier Interactive Patient Education  2018 Elsevier Inc.  

## 2018-03-15 NOTE — Progress Notes (Signed)
Subjective:    Patient ID: Michelle [name], female    DOB: [birth year], 57 y.o.   MRN: [id number]  03/15/2018  Back Pain (pt states upper back pain x 1 week ) and Carpal Tunnel (in the left wrist )    HPI This 57 y.o. female presents for acute visit for back pain and carpal tunnel LEFT.  Lower back pain recurrent.  Adopted daughter is super stressed; very tense.  Can get rigid.  Back started hurting when wrestling daughter.  Robyn's situation; went today for mapping; radiation.  Upper back hurting.  Requesting muscle relaxer.   Called Saturday; requested appointment for [place] or Terisa Belardo. Needs a muscle relaxer. Has been going to [facility] for 19 years.Left side and now RIGHT side.  Radiates into neck.   Needs new wrist splint for left wrist.  +numbness and tingling.  BP Readings from Last 3 Encounters:  03/15/18 122/72  02/26/18 (!) 146/86  01/11/18 126/84   Wt Readings from Last 3 Encounters:  07/22/17 138 lb (62.6 kg)  07/18/16 137 lb (62.1 kg)  06/24/16 140 lb (63.5 kg)   Immunization History  Administered Date(s) Administered  . Influenza Split 08/24/2015  . Influenza,inj,Quad PF,6+ Mos 09/13/2017  . Influenza-Unspecified 08/15/2016  . PPD Test 12/15/2015    Review of Systems  Constitutional: Negative for chills, diaphoresis and fatigue.  Musculoskeletal: Positive for back pain, myalgias, neck pain and neck stiffness. Negative for arthralgias and joint swelling.  Skin: Negative for color change, rash and wound.  Neurological: Positive for weakness and numbness.    Past Medical History:  Diagnosis Date  . Allergy   . Anaphylaxis   . Asthma   . Depression   . Gastroparesis   . Gastroparesis    history of  . GERD (gastroesophageal reflux disease)   . Headache(784.0)    migraines  . Hypercholesteremia   . Hypothyroidism   . Osteoarthritis   . Osteopenia 06/2013   T score -1.2 FRAX 8.6%/0.6%  . Overdose, drug 10/30/2011  . PONV (postoperative nausea and vomiting)     . Reflux   . Seizures ([place])    1 seizure secondary to wellbutrin-none since  . Thyroid cancer ([place])    papillary  . VAIN I (vaginal intraepithelial neoplasia grade I) 09/2013, 11/2014    with positive high-risk HPV screen    Past Surgical History:  Procedure Laterality Date  . BREAST SURGERY     reduc tion mammoplasty  . CHOLECYSTECTOMY  2004  . COLPOSCOPY  02/2012   VAIN I  . COSMETIC SURGERY     buttock liposuction  . HERNIA REPAIR  2012  . NISSEN FUNDOPLICATION  6213, 0865   REFLUX  . THYROID SURGERY  2007   for thyroid cancer  . UPPER GASTROINTESTINAL ENDOSCOPY  08/26/2004  . VAGINAL HYSTERECTOMY  2007   irregular bleeding  . VARICOSE VEIN SURGERY  03/20/2003   right leg   Allergies  Allergen Reactions  . Clarithromycin Anaphylaxis  . Lunesta [Eszopiclone] Other (See Comments)    Causes sleep walking events  . Nutritional Supplements Anaphylaxis  . Pneumococcal Vaccines Anaphylaxis  . Sulfamethoxazole-Trimethoprim Anaphylaxis  . Tetanus Toxoids Swelling  . Wellbutrin [Bupropion Hcl] Other (See Comments)    seizure  . Zolpidem     Causes sleep walking events  . Bactrim Hives and Other (See Comments)    Severe headache  . Clarithromycin Itching and Other (See Comments)    Severe headache  . Doxycycline Other (See Comments)  Severe GI upset  . Hydone [Chlorthalidone] Other (See Comments)    Vasculitis   . Ranitidine Other (See Comments)    Acts like a diuretic   . Reglan [Metoclopramide] Other (See Comments)    Tremors and ticks, possible permanence of effects  . Sulfa Antibiotics Hives and Other (See Comments)    Severe headache  . Tetracyclines & Related Other (See Comments)    Severe GI upset   Current Outpatient Medications on File Prior to Visit  Medication Sig Dispense Refill  . acyclovir ointment (ZOVIRAX) 5 % APPLY ONE APPLICATION TOPICALLY EVERY THREE HOURS 30 g 2  . albuterol (VENTOLIN HFA) 108 (90 Base) MCG/ACT inhaler INHALE TWO PUFFS BY  MOUTH EVERY FOUR HOURS AS NEEDED FOR WHEEZING, SHORTNESS OF BREATH OR COUGH 18 Inhaler 1  . Azelastine HCl 0.15 % SOLN Place 2 sprays into both nostrils 2 (two) times daily. 30 mL 3  . chlorpheniramine-HYDROcodone (TUSSIONEX PENNKINETIC ER) 10-8 MG/5ML SUER Take 5 mLs by mouth every 12 (twelve) hours as needed for cough. 100 mL 0  . EPINEPHrine 0.3 mg/0.3 mL IJ SOAJ injection Inject 0.3 mLs (0.3 mg total) into the muscle once. 2 Device 2  . estradiol (VIVELLE-DOT) 0.075 MG/24HR Apply one patch onto the skin twice weekly 24 patch 2  . ezetimibe (ZETIA) 10 MG tablet Take 1 tablet (10 mg total) by mouth daily. 90 tablet 1  . lansoprazole (PREVACID SOLUTAB) 30 MG disintegrating tablet FOR DIRECTIONS ON HOW TO   TAKE THIS MEDICINE, READ   THE ENCLOSED MEDICATION    INFORMATION FORM 180 tablet 3  . metoprolol succinate (TOPROL-XL) 25 MG 24 hr tablet TAKE 1 TABLET EVERY        MORNING, IF BLOOD PRESSURE STILL ELEVATED TAKE 1      TABLET IN AFTERNOON 180 tablet 2  . ondansetron (ZOFRAN-ODT) 4 MG disintegrating tablet Take 1 tablet (4 mg total) by mouth every 8 (eight) hours as needed for nausea or vomiting. 90 tablet 1  . predniSONE (DELTASONE) 20 MG tablet Take 3 PO QAM x3days, 2 PO QAM x3days, 1 PO QAM x3days 18 tablet 0  . PREVACID SOLUTAB 30 MG disintegrating tablet Take 1 tablet (30 mg total) by mouth 2 (two) times daily. 180 tablet 3  . rosuvastatin (CRESTOR) 20 MG tablet Take 1 tablet (20 mg total) by mouth daily. 90 tablet 1  . Suvorexant (BELSOMRA) 20 MG TABS Take 1 tablet by mouth at bedtime. 30 tablet 5  . SYNTHROID 88 MCG tablet Take 1 tablet (88 mcg total) by mouth daily before breakfast. 90 tablet 3  . traZODone (DESYREL) 50 MG tablet Take 1-2 tablets (50-100 mg total) by mouth at bedtime as needed for sleep. 60 tablet 0  . valACYclovir (VALTREX) 500 MG tablet TAKE ONE TABLET BY MOUTH TWICE DAILY 20 tablet 3  . vitamin B-12 (CYANOCOBALAMIN) 1000 MCG tablet Take 1,000 mcg by mouth daily.        Current Facility-Administered Medications on File Prior to Visit  Medication Dose Route Frequency Provider Last Rate Last Dose  . gadopentetate dimeglumine (MAGNEVIST) injection 15 mL  15 mL Intravenous Once PRN Penumalli, Earlean Polka, MD       Social History   Socioeconomic History  . Marital status: Married    Spouse name: [name]  . Number of children: 2  . Years of education: College  . Highest education level: Not on file  Occupational History  . Occupation: homemaker  Social Needs  . Financial resource strain:  Not on file  . Food insecurity:    Worry: Not on file    Inability: Not on file  . Transportation needs:    Medical: Not on file    Non-medical: Not on file  Tobacco Use  . Smoking status: Never Smoker  . Smokeless tobacco: Never Used  Substance and Sexual Activity  . Alcohol use: No    Alcohol/week: 0.0 oz  . Drug use: No  . Sexual activity: Yes    Partners: Male    Birth control/protection: Surgical    Comment: 1st intercourse 22 yo-1 partner-HYST  Lifestyle  . Physical activity:    Days per week: Not on file    Minutes per session: Not on file  . Stress: Not on file  Relationships  . Social connections:    Talks on phone: Not on file    Gets together: Not on file    Attends religious service: Not on file    Active member of club or organization: Not on file    Attends meetings of clubs or organizations: Not on file    Relationship status: Not on file  . Intimate partner violence:    Fear of current or ex partner: Not on file    Emotionally abused: Not on file    Physically abused: Not on file    Forced sexual activity: Not on file  Other Topics Concern  . Not on file  Social History Narrative   Marital status: married x 36 years      Children: 2 children (1 biological son 66 years old; 56 adopted daughter 70 years old); no grandchildren.      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       Tobacco:  None      Alcohol:  Rarely      Drugs: none      Exercise: Yes. Walking daily around neighborhood.      Receiving therapy with Vivia Budge.   Son lives locally.   Adopted daughter from [place] 11/2016.   Family History  Problem Relation Age of Onset  . Heart disease Mother 54       CABG x 2  . Hypertension Mother   . Hyperlipidemia Mother   . Heart disease Father 62       AMI as cause of death  . Hypertension Father   . Emphysema Father 35       was a smoker  . COPD Father   . Heart disease Sister   . Hypertension Sister   . Spina bifida Sister   . Diabetes Paternal Grandmother   . Multiple sclerosis Other   . Cancer Maternal Grandmother   . Heart disease Brother   . Hypertension Brother   . Leukemia Maternal Grandfather   . Peripheral Artery Disease Sister   . Hypertension Brother   . Arthritis Brother   . Hypertension Brother   . Stroke Brother   . Hypertension Brother   . Heart disease Brother 81       2V CABG  . Hypertension Brother   . Cancer Maternal Aunt        leukemia       Objective:    BP 122/72   Pulse 71   Temp 98 F (36.7 C) (Oral)   Resp 16   Ht 5\' 3"  (1.6 m)   LMP 05/23/2006   SpO2 98%   BMI 24.45 kg/m  Physical Exam  Constitutional: She is oriented to person, place, and time. She appears well-developed and well-nourished. No distress.  HENT:  Head: Normocephalic and atraumatic.  Eyes: Pupils are equal, round, and reactive to light. Conjunctivae are normal.  Neck: Normal range of motion. Neck supple.  Cardiovascular: Normal rate, regular rhythm and normal heart sounds. Exam reveals no gallop and no friction rub.  No murmur heard. Pulmonary/Chest: Effort normal and breath sounds normal. She has no wheezes. She has no rales.  Musculoskeletal:       Left wrist: She exhibits tenderness. She exhibits normal range of motion, no bony tenderness, no swelling and no effusion.       Cervical back: She exhibits tenderness, pain  and spasm. She exhibits normal range of motion, no bony tenderness and normal pulse.       Thoracic back: She exhibits tenderness, pain and spasm. She exhibits normal range of motion, no bony tenderness and normal pulse.       Lumbar back: Normal. She exhibits normal range of motion, no tenderness, no swelling, no pain, no spasm and normal pulse.       Left hand: Normal. She exhibits normal range of motion, no tenderness and normal capillary refill. Normal sensation noted. Normal strength noted.  Neurological: She is alert and oriented to person, place, and time.  Skin: She is not diaphoretic.  Psychiatric: She has a normal mood and affect. Her behavior is normal.  Nursing note and vitals reviewed.  No results found. Depression screen [facility] 2/9 03/15/2018 01/11/2018 12/22/2017 08/30/2017 07/10/2017  Decreased Interest 0 0 0 0 0  Down, Depressed, Hopeless 0 0 0 0 0  PHQ - 2 Score 0 0 0 0 0  Altered sleeping - - - - -  Tired, decreased energy - - - - -  Change in appetite - - - - -  Feeling bad or failure about yourself  - - - - -  Trouble concentrating - - - - -  Moving slowly or fidgety/restless - - - - -  Suicidal thoughts - - - - -  PHQ-9 Score - - - - -  Some recent data might be hidden   Fall Risk  03/15/2018 01/11/2018 12/22/2017 08/30/2017 07/10/2017  Falls in the past year? No No No No No  Number falls in past yr: - - - - -  Injury with Fall? - - - - -  [place] for fall due to : - - - - -  Risk for fall due to: Comment - - - - -        Assessment & Plan:   1. Thoracic back sprain, initial encounter   2. Sprain of left wrist, initial encounter     New onset thoracic sprain: Secondary to lifting daughter repetitively.  Prescription for Flexeril 10 mg 1/2-1 3 times daily as needed.  Recommend heat to area.  Also recommend home exercise program daily.  Limit lifting when possible.  Left wrist sprain: New onset.  Consider left wrist splint during the day.  Avoid  repetitive lifting.  Recommend ice to wrist daily for 15 to 20 minutes.  No orders of the defined types were placed in this encounter.  Meds ordered this encounter  Medications  . cyclobenzaprine (FLEXERIL) 10 MG tablet    Sig: Take 1 tablet (10 mg total) by mouth 3 (three) times daily as needed for muscle spasms.    Dispense:  60 tablet    Refill:  0    No follow-ups on file.   Shanetha Bradham Elayne Guerin, M.D. Primary Care at [facility] [address] [place], [place]  [address] [phone] phone [phone] fax

## 2018-04-01 ENCOUNTER — Telehealth: Payer: Self-pay | Admitting: Family Medicine

## 2018-04-01 NOTE — Telephone Encounter (Signed)
Copied from [place] [id number]. Topic: Quick Communication - See Telephone Encounter >> Apr 01, 2018 10:21 AM Cleaster Corin, NT wrote: CRM for notification. See Telephone encounter for: 04/01/18.  Pt. Stated that her and husband have been cleared to go to [place]. Would like a call back from nurse about selecting antibiotic and cough syrup

## 2018-04-01 NOTE — Telephone Encounter (Signed)
Called and LVM advising pt that they will need OV for these antibiotics and cough syrup.

## 2018-04-02 ENCOUNTER — Telehealth: Payer: Self-pay | Admitting: Family Medicine

## 2018-04-02 DIAGNOSIS — R05 Cough: Secondary | ICD-10-CM

## 2018-04-02 DIAGNOSIS — R059 Cough, unspecified: Secondary | ICD-10-CM

## 2018-04-02 DIAGNOSIS — A09 Infectious gastroenteritis and colitis, unspecified: Secondary | ICD-10-CM

## 2018-04-02 NOTE — Telephone Encounter (Signed)
Pt. Calling to clear up an issue with respect to the phone message left on 04/01/18. Pt. Asserts that Dr. Tamala Julian had specifically asked the patient to give the practice a call to figure out a cough medicine to use during a trip to [place]. To the best of my knowledge this is not mentioned in the notes of the pt's last two visits.   Dr. Tamala Julian has requested the message be forwarded to her directly.   Best Number:  [phone]

## 2018-04-03 MED ORDER — HYDROCOD POLST-CPM POLST ER 10-8 MG/5ML PO SUER: 5.0000 mL | Freq: Two times a day (BID) | ORAL | 0 refills | Status: AC | PRN

## 2018-04-03 NOTE — Telephone Encounter (Signed)
Call --- I sent in T[facility]ex to [facility] for patient.

## 2018-04-04 NOTE — Telephone Encounter (Signed)
Dr. Tamala Julian  [name] would also like an antibiotic called in for her and her husband Michelle [name] DOB 05/03/56 .    She states this was to be called in also for there trip to [place]

## 2018-04-05 ENCOUNTER — Other Ambulatory Visit: Payer: Self-pay | Admitting: Family Medicine

## 2018-04-05 NOTE — Telephone Encounter (Signed)
Patient checking status of antibiotics. Please advise. Call back [phone]

## 2018-04-06 NOTE — Telephone Encounter (Signed)
Triamcinolone 0.1% cream  refill Last OV: 03/15/18  Last Refill:01/29/17 PCP: Dr Reginia Forts [facility] [place] [place]

## 2018-04-11 ENCOUNTER — Other Ambulatory Visit: Payer: Self-pay | Admitting: Family Medicine

## 2018-04-11 DIAGNOSIS — F5101 Primary insomnia: Secondary | ICD-10-CM

## 2018-04-12 MED ORDER — AZITHROMYCIN 250 MG PO TABS: ORAL_TABLET | ORAL | 0 refills | Status: DC

## 2018-04-12 NOTE — Telephone Encounter (Signed)
Pt was seen on 03-15-18 and per pt was told to let md know when she was travelling to [place] . Pt will be leave for [place] 04-14-18 and pt needs abx

## 2018-04-12 NOTE — Telephone Encounter (Signed)
Phone call to patient, advised order has been sent to preferred pharmacy. She is appreciative.

## 2018-04-12 NOTE — Telephone Encounter (Addendum)
Phone call to patient to clarify. Patient states she and her husband would like a broad spectrum antibiotic for prophylaxis of traveler's diarrhea. Was instructed by Dr. Tamala Julian to call when this was needed.   Provider, please provide prescription for [facility] and her spouse if appropriate.

## 2018-04-12 NOTE — Telephone Encounter (Signed)
trazodone refill Last OV: 02/26/18 Last Refill:02/26/18 # 60 tabs No RF [facility] [address] PCP: Reginia Forts MD

## 2018-04-12 NOTE — Addendum Note (Signed)
Addended by: Fara Chute on: 04/12/2018 02:57 PM   Modules accepted: Orders

## 2018-04-12 NOTE — Telephone Encounter (Signed)
Pt is aware dr Tamala Julian is not in office today and would like know if chelle jeffrey would authorize the abx.

## 2018-04-12 NOTE — Telephone Encounter (Signed)
See patient request below, please advise.

## 2018-04-12 NOTE — Telephone Encounter (Signed)
Please clarify which antibiotic (or to treat what condition) she and her husband are requesting.

## 2018-04-12 NOTE — Telephone Encounter (Signed)
Current antibiotic regimen recommendation for traveler's diarrhea is azithromycin.  Meds ordered this encounter  Medications  . azithromycin (ZITHROMAX) 250 MG tablet    Sig: 2 PO QD x 1, then 1 PO QD x 4 as needed for traveler's diarrhea.    Dispense:  6 tablet    Refill:  0    Order Specific Question:   Supervising Provider    Answer:   Brigitte Pulse, EVA N [[id number]]

## 2018-04-15 ENCOUNTER — Other Ambulatory Visit: Payer: Self-pay | Admitting: Family Medicine

## 2018-04-15 NOTE — Telephone Encounter (Signed)
Flexeril 10 mg refill request  LOV 03/15/18 with Dr. Tamala Julian  Last refill:  03/15/18  #60   But did not give any refills.  [id number] [place], [place] - [address]

## 2018-04-18 ENCOUNTER — Encounter: Payer: Self-pay | Admitting: Family Medicine

## 2018-04-19 ENCOUNTER — Encounter: Payer: Self-pay | Admitting: Family Medicine

## 2018-04-21 ENCOUNTER — Telehealth: Payer: Self-pay | Admitting: Family Medicine

## 2018-04-21 NOTE — Telephone Encounter (Signed)
I left a voicemail in regards to cancelling/rescheduling her appt with Dr. Tamala Julian on 08/31/2018 due to provider leaving.

## 2018-05-12 ENCOUNTER — Other Ambulatory Visit: Payer: Self-pay | Admitting: Family Medicine

## 2018-05-23 ENCOUNTER — Telehealth: Payer: Self-pay | Admitting: Family Medicine

## 2018-05-23 NOTE — Telephone Encounter (Signed)
Copied from [place] [phone]. Topic: Inquiry >> May 23, 2018 10:03 AM Ahmed Prima L wrote: Patient is requesting to have the resignation letter sent to her from Dr Tamala Julian. She said that she never received that.

## 2018-05-24 IMAGING — DX DG CHEST 1V
1 series · 1 of 1 positions shown · non-contrast
Comparison: Chest x-ray of 05/28/2015

CLINICAL DATA: Positive TB test

EXAM:
CHEST 1 VIEW

[chest pa]
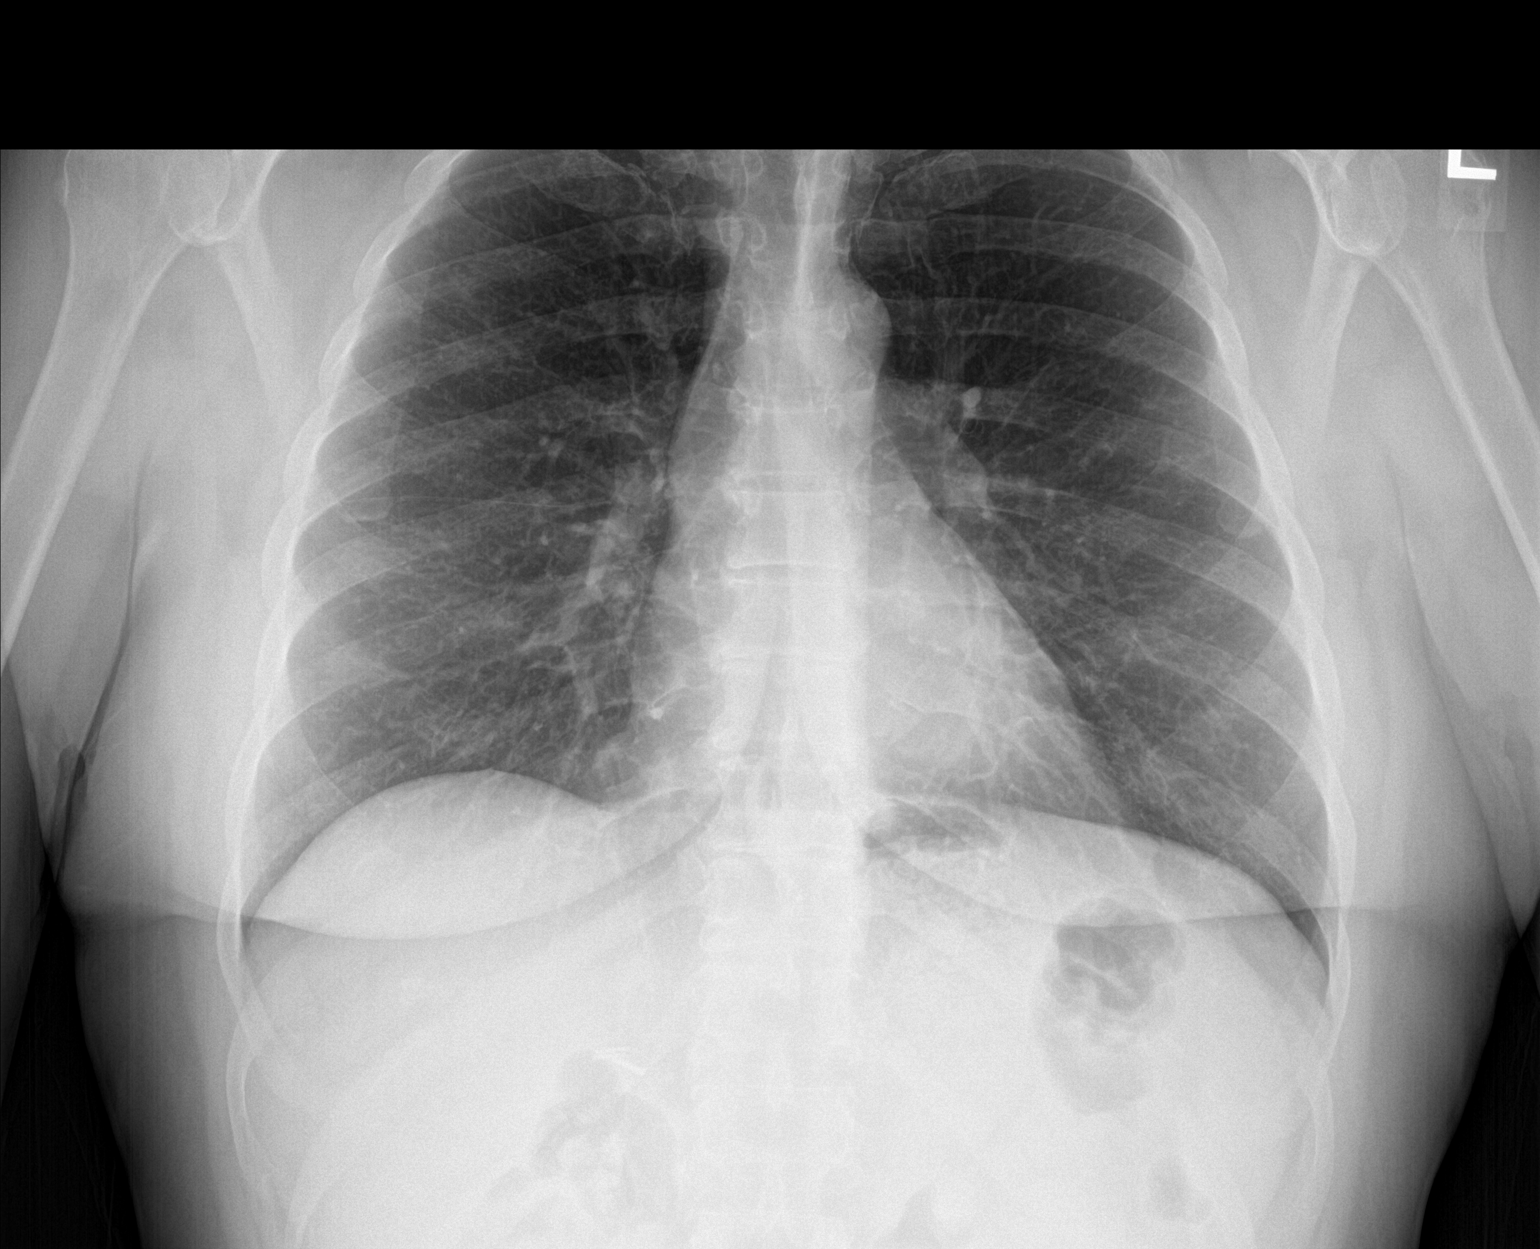

[1 of 1 positions shown; findings below may reference images not displayed]

FINDINGS: No active infiltrate or effusion is seen. No sequela of prior
tuberculous infection is seen. Mediastinal and hilar contours are
unremarkable. The heart is within normal limits in size. No bony
abnormality is seen.
IMPRESSION: No active disease.

## 2018-05-24 NOTE — Telephone Encounter (Signed)
Sent to Medical Records to send the letter out.  Shows it was sent in May.

## 2018-05-25 NOTE — Telephone Encounter (Signed)
I called pt and verified her address. I have mailed out the letter she has  requested- 05/25/18.

## 2018-06-15 ENCOUNTER — Telehealth: Payer: Self-pay | Admitting: Family Medicine

## 2018-06-15 NOTE — Telephone Encounter (Signed)
Copied from [place] [phone]. Topic: Quick Communication - See Telephone Encounter >> Jun 15, 2018  1:17 PM Neva Seat wrote: Pt is calling to get the direct number that Dr. Tamala Julian gave her awhile back for the [facility]. Pt needing this asap.

## 2018-06-19 ENCOUNTER — Other Ambulatory Visit: Payer: Self-pay | Admitting: Family Medicine

## 2018-06-22 NOTE — Telephone Encounter (Signed)
Left voicemail with [facility] dept number [phone] per ROI.  Advised pt to call office with any further questions or concerns. Dgaddy, CMA

## 2018-06-28 ENCOUNTER — Encounter: Payer: Self-pay | Admitting: Family Medicine

## 2018-08-31 ENCOUNTER — Encounter: Payer: Commercial Managed Care - PPO | Admitting: Family Medicine

## 2018-11-08 ENCOUNTER — Other Ambulatory Visit: Payer: Self-pay | Admitting: Gynecology

## 2019-03-24 DIAGNOSIS — R87610 Atypical squamous cells of undetermined significance on cytologic smear of cervix (ASC-US): Secondary | ICD-10-CM

## 2019-03-24 HISTORY — DX: Atypical squamous cells of undetermined significance on cytologic smear of cervix (ASC-US): R87.610

## 2019-04-05 ENCOUNTER — Other Ambulatory Visit: Payer: Self-pay | Admitting: Gynecology

## 2019-04-05 NOTE — Telephone Encounter (Signed)
Last visit was CE 06/2017.  Nothing scheduled.

## 2019-04-10 ENCOUNTER — Other Ambulatory Visit: Payer: Self-pay

## 2019-04-12 ENCOUNTER — Other Ambulatory Visit: Payer: Self-pay

## 2019-04-12 ENCOUNTER — Encounter: Payer: Self-pay | Admitting: Gynecology

## 2019-04-12 ENCOUNTER — Ambulatory Visit: Payer: Commercial Managed Care - PPO | Admitting: Gynecology

## 2019-04-12 VITALS — BP 130/80 | Ht 64.0 in | Wt 146.0 lb

## 2019-04-12 DIAGNOSIS — M8588 Other specified disorders of bone density and structure, other site: Secondary | ICD-10-CM | POA: Diagnosis not present

## 2019-04-12 DIAGNOSIS — Z79890 Hormone replacement therapy (postmenopausal): Secondary | ICD-10-CM

## 2019-04-12 DIAGNOSIS — Z01419 Encounter for gynecological examination (general) (routine) without abnormal findings: Secondary | ICD-10-CM

## 2019-04-12 MED ORDER — ESTRADIOL 0.075 MG/24HR TD PTTW: MEDICATED_PATCH | TRANSDERMAL | 4 refills | Status: DC

## 2019-04-12 NOTE — Progress Notes (Signed)
    AMBRIE CARTE 1961-02-25 [id number]        58 y.o.  G1P1 for annual gynecologic exam.  Doing well without gynecologic complaints  Past medical history,surgical history, problem list, medications, allergies, family history and social history were all reviewed and documented as reviewed in the EPIC chart.  ROS:  Performed with pertinent positives and negatives included in the history, assessment and plan.   Additional significant findings : None   Exam: Caryn Bee assistant Vitals:   04/12/19 1221  BP: 130/80  Weight: 146 lb (66.2 kg)  Height: 5\' 4"  (1.626 m)   Body mass index is 25.06 kg/m.  General appearance:  Normal affect, orientation and appearance. Skin: Grossly normal HEENT: Without gross lesions.  No cervical or supraclavicular adenopathy. Thyroid normal.  Lungs:  Clear without wheezing, rales or rhonchi Cardiac: RR, without RMG Abdominal:  Soft, nontender, without masses, guarding, rebound, organomegaly or hernia Breasts:  Examined lying and sitting without masses, retractions, discharge or axillary adenopathy.  Bilateral reduction scars. Pelvic:  Ext, BUS, Vagina: Normal with atrophic changes.  Pap smear of vaginal cuff done.  Adnexa: Without masses or tenderness    Anus and perineum: Normal   Rectovaginal: Normal sphincter tone without palpated masses or tenderness.    Assessment/Plan:  58 y.o. G1P1 female for annual gynecologic exam.  Status post [facility] 2007 for bleeding.  1. HRT.  Continues on Vivelle 0.075 mg patches.  Will develop hot flushes and sweats when she forgets to change them.  Risks to include thrombosis such as stroke heart attack DVT in the breast cancer issue reviewed.  Benefits to include symptom relief as well as possible cardiovascular and bone health also discussed.  At this point the patient wishes to continue and I refilled her x1 year. 2. History of VAIN 1 and positive high risk HPV in the past.  Most recent Pap smears to include Pap  smear/HPV 2017 was negative as well as follow-up Pap smear 2018.  Pap smear was done today.  If negative then will plan less frequent screening intervals. 3. Mammography 2016.  Need to schedule screening mammogram reviewed as she is overdue.  Breast exam normal today. 4. Osteopenia.  DEXA 2014 T score -1.2 FRAX 8.6% / 0.6%.  Recommend follow-up DEXA now at 6-year interval and patient agrees to schedule. 5. Colonoscopy 2016.  Repeat at their recommended interval. 6. Health maintenance.  No routine lab work done as patient does this elsewhere.  Follow-up 1 year, sooner as needed.   Anastasio Auerbach MD, 12:58 PM 04/12/2019

## 2019-04-12 NOTE — Patient Instructions (Addendum)
Schedule your mammogram  Follow-up for the bone density as scheduled.

## 2019-04-12 NOTE — Addendum Note (Signed)
Addended by: Nelva Nay on: 04/12/2019 02:00 PM   Modules accepted: Orders

## 2019-04-19 ENCOUNTER — Encounter: Payer: Self-pay | Admitting: Gynecology

## 2019-04-19 LAB — PAP IG W/ RFLX HPV ASCU

## 2019-04-19 LAB — HUMAN PAPILLOMAVIRUS, HIGH RISK: HPV DNA High Risk: NOT DETECTED

## 2019-08-22 ENCOUNTER — Encounter: Payer: Self-pay | Admitting: Gynecology

## 2020-10-24 ENCOUNTER — Encounter: Payer: Commercial Managed Care - PPO | Admitting: Obstetrics and Gynecology

## 2020-10-24 DIAGNOSIS — Z0289 Encounter for other administrative examinations: Secondary | ICD-10-CM

## 2020-11-23 DIAGNOSIS — Z8616 Personal history of COVID-19: Secondary | ICD-10-CM

## 2020-11-23 HISTORY — DX: Personal history of COVID-19: Z86.16

## 2021-05-25 ENCOUNTER — Emergency Department (HOSPITAL_BASED_OUTPATIENT_CLINIC_OR_DEPARTMENT_OTHER)
Admission: EM | Admit: 2021-05-25 | Discharge: 2021-05-25 | Disposition: A | Discharge status: Home / Self Care | Payer: Commercial Managed Care - PPO | Attending: Emergency Medicine | Admitting: Emergency Medicine

## 2021-05-25 ENCOUNTER — Encounter (HOSPITAL_BASED_OUTPATIENT_CLINIC_OR_DEPARTMENT_OTHER): Payer: Self-pay

## 2021-05-25 DIAGNOSIS — E039 Hypothyroidism, unspecified: Secondary | ICD-10-CM | POA: Insufficient documentation

## 2021-05-25 DIAGNOSIS — R21 Rash and other nonspecific skin eruption: Secondary | ICD-10-CM | POA: Diagnosis present

## 2021-05-25 DIAGNOSIS — Z79899 Other long term (current) drug therapy: Secondary | ICD-10-CM | POA: Diagnosis not present

## 2021-05-25 DIAGNOSIS — L309 Dermatitis, unspecified: Secondary | ICD-10-CM | POA: Diagnosis not present

## 2021-05-25 DIAGNOSIS — H6013 Cellulitis of external ear, bilateral: Secondary | ICD-10-CM | POA: Insufficient documentation

## 2021-05-25 DIAGNOSIS — J45909 Unspecified asthma, uncomplicated: Secondary | ICD-10-CM | POA: Diagnosis not present

## 2021-05-25 DIAGNOSIS — Z85850 Personal history of malignant neoplasm of thyroid: Secondary | ICD-10-CM | POA: Insufficient documentation

## 2021-05-25 MED ORDER — HYDROXYZINE HCL 25 MG PO TABS: 25.0000 mg | ORAL_TABLET | Freq: Three times a day (TID) | ORAL | 0 refills | Status: DC | PRN

## 2021-05-25 MED ORDER — PIMECROLIMUS 1 % EX CREA: TOPICAL_CREAM | Freq: Two times a day (BID) | CUTANEOUS | 0 refills | Status: AC

## 2021-05-25 MED ORDER — CEPHALEXIN 500 MG PO CAPS: 500.0000 mg | ORAL_CAPSULE | Freq: Four times a day (QID) | ORAL | 0 refills | Status: DC

## 2021-05-25 MED ORDER — PREDNISONE 10 MG (21) PO TBPK: ORAL_TABLET | Freq: Every day | ORAL | 0 refills | Status: DC

## 2021-05-25 NOTE — ED Provider Notes (Signed)
[place] EMERGENCY DEPT Provider Note   CSN: [id number] Arrival date & time: 05/25/21  6195     History Chief Complaint  Patient presents with   Rash    Michelle [name] is a 60 y.o. female.  Pt presents to the ED today with an itchy rash.  The pt said she has had a rash for several months.  She has tried several steroid creams without improvement.  It will flare up and calm down, but never goes away.  She has seen dermatology, but no skin biopsy has been done.  Pt said prednisone does help.  Pt has some yellowish drainage now from behind her ears. No f/c.      Past Medical History:  Diagnosis Date   Allergy    Anaphylaxis    ASCUS of cervix with negative high risk HPV 03/2019   Asthma    Depression    Gastroparesis    Gastroparesis    history of   GERD (gastroesophageal reflux disease)    Headache(784.0)    migraines   Hypercholesteremia    Hypothyroidism    Osteoarthritis    Osteopenia 06/2013   T score -1.2 FRAX 8.6%/0.6%   Overdose, drug 10/30/2011   PONV (postoperative nausea and vomiting)    Reflux    Seizures ([place])    1 seizure secondary to wellbutrin-none since   Thyroid cancer ([place])    papillary   VAIN I (vaginal intraepithelial neoplasia grade I) 09/2013, 11/2014    with positive high-risk HPV screen     Patient Active Problem List   Diagnosis Date Noted   Insomnia 08/25/2016   Abnormal brain MRI 11/07/2015   Cough 04/02/2013   Abnormal CXR 04/02/2013   Leukocytosis 10/18/2012   History of anaphylaxis 03/01/2012   Depression 10/30/2011   H/O suicide attempt 10/30/2011   Hx of thyroid cancer    Hypothyroidism    Tachycardia    Asthma    Gastroparesis    Hypercholesteremia    VAIN (vaginal intraepithelial neoplasia)    GERD (gastroesophageal reflux disease) 07/16/2011    Past Surgical History:  Procedure Laterality Date   BREAST SURGERY     reduc tion mammoplasty   CHOLECYSTECTOMY  2004   COLPOSCOPY  02/2012   VAIN I    COSMETIC SURGERY     buttock liposuction   HERNIA REPAIR  0932   NISSEN FUNDOPLICATION  6712, 4580   [place]   THYROID SURGERY  2007   for thyroid cancer   UPPER GASTROINTESTINAL ENDOSCOPY  08/26/2004   VAGINAL HYSTERECTOMY  2007   irregular bleeding   VARICOSE VEIN SURGERY  03/20/2003   right leg     OB History     Gravida  1   Para  1   Term      Preterm      AB      Living  1      SAB      IAB      Ectopic      Multiple      Live Births              Family History  Problem Relation Age of Onset   Heart disease Mother 44       CABG x 2   Hypertension Mother    Hyperlipidemia Mother    Heart disease Father 40       AMI as cause of death   Hypertension Father    Emphysema Father  72       was a smoker   COPD Father    Heart disease Sister    Hypertension Sister    Spina bifida Sister    Diabetes Paternal Grandmother    Multiple sclerosis Other    Cancer Maternal Grandmother    Heart disease Brother    Hypertension Brother    Leukemia Maternal Grandfather    Peripheral Artery Disease Sister    Cancer Sister        Lung cancer   Hypertension Brother    Arthritis Brother    Hypertension Brother    Stroke Brother    Hypertension Brother    Heart disease Brother 66       2V CABG   Hypertension Brother    Cancer Maternal Aunt        leukemia    Social History   Tobacco Use   Smoking status: Never   Smokeless tobacco: Never  Vaping Use   Vaping Use: Never used  Substance Use Topics   Alcohol use: No    Alcohol/week: 0.0 standard drinks   Drug use: No    Home Medications Prior to Admission medications   Medication Sig Start Date End Date Taking? Authorizing Provider  cephALEXin (KEFLEX) 500 MG capsule Take 1 capsule (500 mg total) by mouth 4 (four) times daily. 05/25/21  Yes Isla Pence, MD  hydrOXYzine (ATARAX/VISTARIL) 25 MG tablet Take 1 tablet (25 mg total) by mouth every 8 (eight) hours as needed for itching. 05/25/21  Yes  Isla Pence, MD  pimecrolimus (ELIDEL) 1 % cream Apply topically 2 (two) times daily. 05/25/21  Yes Isla Pence, MD  predniSONE (STERAPRED UNI-PAK 21 TAB) 10 MG (21) TBPK tablet Take by mouth daily. Take 6 tabs by mouth daily  for 2 days, then 5 tabs for 2 days, then 4 tabs for 2 days, then 3 tabs for 2 days, 2 tabs for 2 days, then 1 tab by mouth daily for 2 days 05/25/21  Yes Isla Pence, MD  acyclovir ointment (ZOVIRAX) 5 % APPLY ONE APPLICATION TOPICALLY EVERY THREE HOURS 07/24/17   Wardell Honour, MD  albuterol (VENTOLIN HFA) 108 (90 Base) MCG/ACT inhaler INHALE TWO PUFFS BY MOUTH EVERY FOUR HOURS AS NEEDED FOR WHEEZING, SHORTNESS OF BREATH OR COUGH Patient not taking: Reported on 04/12/2019 02/26/18   Wardell Honour, MD  Azelastine HCl 0.15 % SOLN Place 2 sprays into both nostrils 2 (two) times daily. Patient not taking: Reported on 04/12/2019 07/24/17   Wardell Honour, MD  azithromycin (ZITHROMAX) 250 MG tablet 2 PO QD x 1, then 1 PO QD x 4 as needed for traveler's diarrhea. Patient not taking: Reported on 04/12/2019 04/12/18   Harrison Mons, PA  cyclobenzaprine (FLEXERIL) 10 MG tablet TAKE 1 TABLET BY MOUTH THREE TIMES A DAY AS NEEDED FOR MUSCLE SPASMS 04/19/18   Wardell Honour, MD  EPINEPHrine 0.3 mg/0.3 mL IJ SOAJ injection Inject 0.3 mLs (0.3 mg total) into the muscle once. Patient not taking: Reported on 04/12/2019 10/24/14   Darlyne Russian, MD  estradiol (VIVELLE-DOT) 0.075 MG/24HR Apply one patch onto the skin twice weekly 04/12/19   Fontaine, Belinda Block, MD  ezetimibe (ZETIA) 10 MG tablet Take 1 tablet (10 mg total) by mouth daily. 02/26/18   Wardell Honour, MD  lansoprazole (PREVACID SOLUTAB) 30 MG disintegrating tablet FOR DIRECTIONS ON HOW TO   TAKE THIS MEDICINE, READ   THE ENCLOSED MEDICATION    INFORMATION FORM 11/29/17  Wardell Honour, MD  metoprolol succinate (TOPROL-XL) 25 MG 24 hr tablet TAKE 1 TABLET EVERY        MORNING, IF BLOOD PRESSURE STILL ELEVATED TAKE 1      TABLET IN  AFTERNOON 10/11/17   Wardell Honour, MD  ondansetron (ZOFRAN-ODT) 4 MG disintegrating tablet Take 1 tablet (4 mg total) by mouth every 8 (eight) hours as needed for nausea or vomiting. 02/26/18   Wardell Honour, MD  PREVACID SOLUTAB 30 MG disintegrating tablet Take 1 tablet (30 mg total) by mouth 2 (two) times daily. 07/05/17   Wardell Honour, MD  rosuvastatin (CRESTOR) 20 MG tablet TAKE 1 TABLET DAILY 05/13/18   Wardell Honour, MD  Suvorexant (BELSOMRA) 20 MG TABS Take 1 tablet by mouth at bedtime. Patient not taking: Reported on 04/12/2019 02/26/18   Wardell Honour, MD  SYNTHROID 88 MCG tablet Take 1 tablet (88 mcg total) by mouth daily before breakfast. 07/24/17   Wardell Honour, MD  traZODone (DESYREL) 50 MG tablet TAKE 1-2 TABLETS (50-100 MG TOTAL) BY MOUTH AT BEDTIME AS NEEDED FOR SLEEP. Patient not taking: Reported on 04/12/2019 04/14/18   Wardell Honour, MD  triamcinolone cream (KENALOG) 0.1 % APPLY 1 APPLICATION TOPICALLY 2 (TWO) TIMES DAILY. 04/12/18   Wardell Honour, MD  valACYclovir (VALTREX) 500 MG tablet TAKE ONE TABLET BY MOUTH TWICE DAILY 02/22/18   Fontaine, Belinda Block, MD  vitamin B-12 (CYANOCOBALAMIN) 1000 MCG tablet Take 1,000 mcg by mouth daily.      [provider]    Allergies    Clarithromycin, Lunesta [eszopiclone], Nutritional supplements, Pneumococcal vaccines, Sulfamethoxazole-trimethoprim, Tetanus toxoids, Wellbutrin [bupropion hcl], Zolpidem, Bactrim, Clarithromycin, Doxycycline, Hydone [chlorthalidone], Ranitidine, Reglan [metoclopramide], Sulfa antibiotics, and Tetracyclines & related  Review of Systems   Review of Systems  Skin:  Positive for rash.  All other systems reviewed and are negative.  Physical Exam Updated Vital Signs BP (!) 169/102 (BP Location: Right Arm)   Pulse 85   Temp 97.9 F (36.6 C) (Oral)   Resp 18   LMP 05/23/2006   SpO2 98%   Physical Exam Vitals and nursing note reviewed.  Constitutional:      Appearance: Normal appearance.   HENT:     Head: Normocephalic and atraumatic.     Ears:     Comments: Rash to both external ears.  Secondary cellulitis with yellowish drainage.    Nose: Nose normal.     Mouth/Throat:     Mouth: Mucous membranes are moist.     Pharynx: Oropharynx is clear.  Eyes:     Extraocular Movements: Extraocular movements intact.     Conjunctiva/sclera: Conjunctivae normal.     Pupils: Pupils are equal, round, and reactive to light.  Cardiovascular:     Rate and Rhythm: Normal rate and regular rhythm.     Pulses: Normal pulses.     Heart sounds: Normal heart sounds.  Pulmonary:     Effort: Pulmonary effort is normal.     Breath sounds: Normal breath sounds.  Abdominal:     General: Abdomen is flat. Bowel sounds are normal.     Palpations: Abdomen is soft.  Musculoskeletal:        General: Normal range of motion.     Cervical back: Normal range of motion and neck supple.  Skin:    Capillary Refill: Capillary refill takes less than 2 seconds.     Comments: Rash to both arms and hands with excoriation marks  Neurological:     General: No focal deficit present.     Mental Status: She is alert and oriented to person, place, and time.    ED Results / Procedures / Treatments   Labs (all labs ordered are listed, but only abnormal results are displayed) Labs Reviewed - No data to display  EKG None  Radiology No results found.  Procedures Procedures   Medications Ordered in ED Medications - No data to display  ED Course  I have reviewed the triage vital signs and the nursing notes.  Pertinent labs & imaging results that were available during my care of the patient were reviewed by me and considered in my medical decision making (see chart for details).    MDM Rules/Calculators/A&P                          I will try Elidel cream (she has not tried that), prednisone taper, and keflex.  She is to f/u with derm.  Return if worse.  Final Clinical Impression(s) / ED  Diagnoses Final diagnoses:  Dermatitis  Cellulitis of both ears    Rx / DC Orders ED Discharge Orders          Ordered    pimecrolimus (ELIDEL) 1 % cream  2 times daily        05/25/21 0926    predniSONE (STERAPRED UNI-PAK 21 TAB) 10 MG (21) TBPK tablet  Daily        05/25/21 0926    hydrOXYzine (ATARAX/VISTARIL) 25 MG tablet  Every 8 hours PRN        05/25/21 0927    cephALEXin (KEFLEX) 500 MG capsule  4 times daily        05/25/21 4627             Isla Pence, MD 05/25/21 [phone]

## 2021-05-25 NOTE — ED Triage Notes (Signed)
She tells me that she "have been fighting this rash from h*ll for about 7 months. She has taken a few prednisone dosepacks, which did improve the rash. She has scattered red, raised pruritic rash.

## 2021-08-14 ENCOUNTER — Ambulatory Visit: Payer: Commercial Managed Care - PPO | Admitting: Allergy & Immunology

## 2021-10-05 ENCOUNTER — Other Ambulatory Visit: Payer: Self-pay

## 2021-10-05 ENCOUNTER — Encounter (HOSPITAL_BASED_OUTPATIENT_CLINIC_OR_DEPARTMENT_OTHER): Payer: Self-pay | Admitting: Obstetrics and Gynecology

## 2021-10-05 ENCOUNTER — Emergency Department (HOSPITAL_BASED_OUTPATIENT_CLINIC_OR_DEPARTMENT_OTHER)
Admission: EM | Admit: 2021-10-05 | Discharge: 2021-10-05 | Disposition: A | Discharge status: Home / Self Care | Payer: Commercial Managed Care - PPO | Attending: Emergency Medicine | Admitting: Emergency Medicine

## 2021-10-05 DIAGNOSIS — L302 Cutaneous autosensitization: Secondary | ICD-10-CM | POA: Insufficient documentation

## 2021-10-05 DIAGNOSIS — Z79899 Other long term (current) drug therapy: Secondary | ICD-10-CM | POA: Diagnosis not present

## 2021-10-05 DIAGNOSIS — J45909 Unspecified asthma, uncomplicated: Secondary | ICD-10-CM | POA: Diagnosis not present

## 2021-10-05 DIAGNOSIS — Z85850 Personal history of malignant neoplasm of thyroid: Secondary | ICD-10-CM | POA: Diagnosis not present

## 2021-10-05 DIAGNOSIS — E039 Hypothyroidism, unspecified: Secondary | ICD-10-CM | POA: Diagnosis not present

## 2021-10-05 DIAGNOSIS — L309 Dermatitis, unspecified: Secondary | ICD-10-CM

## 2021-10-05 DIAGNOSIS — R21 Rash and other nonspecific skin eruption: Secondary | ICD-10-CM | POA: Diagnosis present

## 2021-10-05 MED ORDER — PREDNISONE 10 MG PO TABS: ORAL_TABLET | ORAL | 0 refills | Status: DC

## 2021-10-05 NOTE — Discharge Instructions (Addendum)
Return if any problems.

## 2021-10-05 NOTE — ED Triage Notes (Signed)
Patient reports to the ER for eczema flare. Patinet states she just needs a prednisone taper to get through

## 2021-10-05 NOTE — ED Provider Notes (Signed)
[place] EMERGENCY DEPT Provider Note   CSN: [id number] Arrival date & time: 10/05/21  1535     History Chief Complaint  Patient presents with   Eczema    Michelle [name] is a 60 y.o. female.  a  The history is provided by the patient. No language interpreter was used.  Rash Location:  Full body Quality: painful and redness   Pain details:    Quality:  Aching   Severity:  Severe   Onset quality:  Gradual   Duration:  3 weeks   Timing:  Constant   Progression:  Worsening Severity:  Severe Onset quality:  Gradual Relieved by:  None tried Associated symptoms: no nausea       Past Medical History:  Diagnosis Date   Allergy    Anaphylaxis    ASCUS of cervix with negative high risk HPV 03/2019   Asthma    Depression    Gastroparesis    Gastroparesis    history of   GERD (gastroesophageal reflux disease)    Headache(784.0)    migraines   Hypercholesteremia    Hypothyroidism    Osteoarthritis    Osteopenia 06/2013   T score -1.2 FRAX 8.6%/0.6%   Overdose, drug 10/30/2011   PONV (postoperative nausea and vomiting)    Reflux    Seizures ([place])    1 seizure secondary to wellbutrin-none since   Thyroid cancer ([place])    papillary   VAIN I (vaginal intraepithelial neoplasia grade I) 09/2013, 11/2014    with positive high-risk HPV screen     Patient Active Problem List   Diagnosis Date Noted   Insomnia 08/25/2016   Abnormal brain MRI 11/07/2015   Cough 04/02/2013   Abnormal CXR 04/02/2013   Leukocytosis 10/18/2012   History of anaphylaxis 03/01/2012   Depression 10/30/2011   H/O suicide attempt 10/30/2011   Hx of thyroid cancer    Hypothyroidism    Tachycardia    Asthma    Gastroparesis    Hypercholesteremia    VAIN (vaginal intraepithelial neoplasia)    GERD (gastroesophageal reflux disease) 07/16/2011    Past Surgical History:  Procedure Laterality Date   BREAST SURGERY     reduc tion mammoplasty   CHOLECYSTECTOMY  2004    COLPOSCOPY  02/2012   VAIN I   COSMETIC SURGERY     buttock liposuction   HERNIA REPAIR  2725   NISSEN FUNDOPLICATION  3664, 4034   [place]   THYROID SURGERY  2007   for thyroid cancer   UPPER GASTROINTESTINAL ENDOSCOPY  08/26/2004   VAGINAL HYSTERECTOMY  2007   irregular bleeding   VARICOSE VEIN SURGERY  03/20/2003   right leg     OB History     Gravida  1   Para  1   Term      Preterm      AB      Living  1      SAB      IAB      Ectopic      Multiple      Live Births              Family History  Problem Relation Age of Onset   Heart disease Mother 15       CABG x 2   Hypertension Mother    Hyperlipidemia Mother    Heart disease Father 15       AMI as cause of death   Hypertension Father  Emphysema Father 71       was a smoker   COPD Father    Heart disease Sister    Hypertension Sister    Spina bifida Sister    Diabetes Paternal Grandmother    Multiple sclerosis Other    Cancer Maternal Grandmother    Heart disease Brother    Hypertension Brother    Leukemia Maternal Grandfather    Peripheral Artery Disease Sister    Cancer Sister        Lung cancer   Hypertension Brother    Arthritis Brother    Hypertension Brother    Stroke Brother    Hypertension Brother    Heart disease Brother 85       2V CABG   Hypertension Brother    Cancer Maternal Aunt        leukemia    Social History   Tobacco Use   Smoking status: Never   Smokeless tobacco: Never  Vaping Use   Vaping Use: Never used  Substance Use Topics   Alcohol use: No    Alcohol/week: 0.0 standard drinks   Drug use: No    Home Medications Prior to Admission medications   Medication Sig Start Date End Date Taking? Authorizing Provider  predniSONE (DELTASONE) 10 MG tablet 6,6,5,5,4,4,3,3,2,2,1,1 taper 10/05/21  Yes Caryl Ada K, PA-C  acyclovir ointment (ZOVIRAX) 5 % APPLY ONE APPLICATION TOPICALLY EVERY THREE HOURS 07/24/17   Wardell Honour, MD  albuterol (VENTOLIN  HFA) 108 (90 Base) MCG/ACT inhaler INHALE TWO PUFFS BY MOUTH EVERY FOUR HOURS AS NEEDED FOR WHEEZING, SHORTNESS OF BREATH OR COUGH Patient not taking: Reported on 04/12/2019 02/26/18   Wardell Honour, MD  Azelastine HCl 0.15 % SOLN Place 2 sprays into both nostrils 2 (two) times daily. Patient not taking: Reported on 04/12/2019 07/24/17   Wardell Honour, MD  azithromycin (ZITHROMAX) 250 MG tablet 2 PO QD x 1, then 1 PO QD x 4 as needed for traveler's diarrhea. Patient not taking: Reported on 04/12/2019 04/12/18   Harrison Mons, PA  cephALEXin (KEFLEX) 500 MG capsule Take 1 capsule (500 mg total) by mouth 4 (four) times daily. 05/25/21   Isla Pence, MD  cyclobenzaprine (FLEXERIL) 10 MG tablet TAKE 1 TABLET BY MOUTH THREE TIMES A DAY AS NEEDED FOR MUSCLE SPASMS 04/19/18   Wardell Honour, MD  EPINEPHrine 0.3 mg/0.3 mL IJ SOAJ injection Inject 0.3 mLs (0.3 mg total) into the muscle once. Patient not taking: Reported on 04/12/2019 10/24/14   Darlyne Russian, MD  estradiol (VIVELLE-DOT) 0.075 MG/24HR Apply one patch onto the skin twice weekly 04/12/19   Fontaine, Belinda Block, MD  ezetimibe (ZETIA) 10 MG tablet Take 1 tablet (10 mg total) by mouth daily. 02/26/18   Wardell Honour, MD  hydrOXYzine (ATARAX/VISTARIL) 25 MG tablet Take 1 tablet (25 mg total) by mouth every 8 (eight) hours as needed for itching. 05/25/21   Isla Pence, MD  lansoprazole (PREVACID SOLUTAB) 30 MG disintegrating tablet FOR DIRECTIONS ON HOW TO   TAKE THIS MEDICINE, READ   THE ENCLOSED MEDICATION    INFORMATION FORM 11/29/17   Wardell Honour, MD  metoprolol succinate (TOPROL-XL) 25 MG 24 hr tablet TAKE 1 TABLET EVERY        MORNING, IF BLOOD PRESSURE STILL ELEVATED TAKE 1      TABLET IN AFTERNOON 10/11/17   Wardell Honour, MD  ondansetron (ZOFRAN-ODT) 4 MG disintegrating tablet Take 1 tablet (4 mg total) by mouth every  8 (eight) hours as needed for nausea or vomiting. 02/26/18   Wardell Honour, MD  pimecrolimus (ELIDEL) 1 % cream Apply  topically 2 (two) times daily. 05/25/21   Isla Pence, MD  PREVACID SOLUTAB 30 MG disintegrating tablet Take 1 tablet (30 mg total) by mouth 2 (two) times daily. 07/05/17   Wardell Honour, MD  rosuvastatin (CRESTOR) 20 MG tablet TAKE 1 TABLET DAILY 05/13/18   Wardell Honour, MD  Suvorexant (BELSOMRA) 20 MG TABS Take 1 tablet by mouth at bedtime. Patient not taking: Reported on 04/12/2019 02/26/18   Wardell Honour, MD  SYNTHROID 88 MCG tablet Take 1 tablet (88 mcg total) by mouth daily before breakfast. 07/24/17   Wardell Honour, MD  traZODone (DESYREL) 50 MG tablet TAKE 1-2 TABLETS (50-100 MG TOTAL) BY MOUTH AT BEDTIME AS NEEDED FOR SLEEP. Patient not taking: Reported on 04/12/2019 04/14/18   Wardell Honour, MD  triamcinolone cream (KENALOG) 0.1 % APPLY 1 APPLICATION TOPICALLY 2 (TWO) TIMES DAILY. 04/12/18   Wardell Honour, MD  valACYclovir (VALTREX) 500 MG tablet TAKE ONE TABLET BY MOUTH TWICE DAILY 02/22/18   Fontaine, Belinda Block, MD  vitamin B-12 (CYANOCOBALAMIN) 1000 MCG tablet Take 1,000 mcg by mouth daily.      [provider]    Allergies    Clarithromycin, Lunesta [eszopiclone], Nutritional supplements, Pneumococcal vaccines, Sulfamethoxazole-trimethoprim, Tetanus toxoids, Wellbutrin [bupropion hcl], Zolpidem, Bactrim, Clarithromycin, Doxycycline, Hydone [chlorthalidone], Ranitidine, Reglan [metoclopramide], Sulfa antibiotics, and Tetracyclines & related  Review of Systems   Review of Systems  Gastrointestinal:  Negative for nausea.  Skin:  Positive for rash.  All other systems reviewed and are negative.  Physical Exam Updated Vital Signs BP (!) 180/101 (BP Location: Right Arm)   Pulse 83   Temp 98.6 F (37 C)   Resp 18   Ht 5\' 4"  (1.626 m)   Wt 67.1 kg   LMP 05/23/2006   SpO2 99%   BMI 25.40 kg/m   Physical Exam Vitals reviewed.  Constitutional:      Appearance: Normal appearance.  HENT:     Head: Normocephalic.  Cardiovascular:     Rate and Rhythm: Normal  rate.  Pulmonary:     Effort: Pulmonary effort is normal.  Musculoskeletal:        General: Normal range of motion.  Skin:    Findings: Rash present.     Comments: Red raised rash full body    Neurological:     General: No focal deficit present.     Mental Status: She is alert.  Psychiatric:        Mood and Affect: Mood normal.    ED Results / Procedures / Treatments   Labs (all labs ordered are listed, but only abnormal results are displayed) Labs Reviewed - No data to display  EKG None  Radiology No results found.  Procedures Procedures   Medications Ordered in ED Medications - No data to display  ED Course  I have reviewed the triage vital signs and the nursing notes.  Pertinent labs & imaging results that were available during my care of the patient were reviewed by me and considered in my medical decision making (see chart for details).    MDM Rules/Calculators/A&P                           MDM:  Pt reports she has done well with prednisone tapers in the past.  Final Clinical  Impression(s) / ED Diagnoses Final diagnoses:  Eczema, unspecified type    Rx / DC Orders ED Discharge Orders          Ordered    predniSONE (DELTASONE) 10 MG tablet        10/05/21 1601          An After Visit Summary was printed and given to the patient.    Fransico Meadow, [place] 10/05/21 1610    Regan Lemming, MD 10/05/21 1704

## 2021-12-15 ENCOUNTER — Encounter (HOSPITAL_BASED_OUTPATIENT_CLINIC_OR_DEPARTMENT_OTHER): Payer: Self-pay | Admitting: Orthopedic Surgery

## 2021-12-15 ENCOUNTER — Other Ambulatory Visit: Payer: Self-pay

## 2021-12-15 NOTE — Progress Notes (Signed)
Spoke w/ via phone for pre-op interview---pt Lab needs dos----   none per anesthesia, surgery orders pending            Lab results------none COVID test -----patient states asymptomatic no test needed Arrive at -------530 am 12-18-2021 NPO after MN NO Solid Food.  Clear liquids from MN until---430 am Med rec completed Medications to take morning of surgery -----Synthroid, Rosuvastatin, Zofran prn, Metoprolol Succinate, Prevacid, Zetia, Albuterol inhaler prn/bring inhaler Diabetic medication -----n/a Patient instructed no nail polish to be worn day of surgery Patient instructed to bring photo id and insurance card day of surgery Patient aware to have Driver (ride ) / caregiver    for 24 hours after surgery  Michelle [name] Husband Patient Special Instructions -----none Pre-Op special Istructions -----none Patient verbalized understanding of instructions that were given at this phone interview. Patient denies shortness of breath, chest pain, fever, cough at this phone interview.   Pt to call dr Greta Doom and ask for cortisone shot to left shoulder to be done day of surgery 12-18-2021, no surgery odrers in yet.

## 2021-12-17 NOTE — Anesthesia Preprocedure Evaluation (Addendum)
Anesthesia Evaluation  Patient identified by MRN, date of birth, ID band Patient awake    Reviewed: Allergy & Precautions, H&P , NPO status , Patient's Chart, lab work & pertinent test results, reviewed documented beta blocker date and time   History of Anesthesia Complications (+) PONV  Airway Mallampati: II  TM Distance: >3 FB Neck ROM: Full    Dental no notable dental hx. (+) Teeth Intact, Dental Advisory Given   Pulmonary asthma ,    Pulmonary exam normal breath sounds clear to auscultation       Cardiovascular Exercise Tolerance: Good + dysrhythmias  Rhythm:Regular Rate:Normal     Neuro/Psych  Headaches, Seizures -, Well Controlled,  Depression    GI/Hepatic Neg liver ROS, GERD  Medicated,  Endo/Other  Hypothyroidism   Renal/GU negative Renal ROS  negative genitourinary   Musculoskeletal  (+) Arthritis , Osteoarthritis,    Abdominal   Peds  Hematology negative hematology ROS (+)   Anesthesia Other Findings   Reproductive/Obstetrics negative OB ROS                            Anesthesia Physical Anesthesia Plan  ASA: 2  Anesthesia Plan: MAC and Regional   Post-op Pain Management: Tylenol PO (pre-op) and Minimal or no pain anticipated   Induction: Intravenous  PONV Risk Score and Plan: 3 and Propofol infusion, Midazolam and Ondansetron  Airway Management Planned: Natural Airway and Simple Face Mask  Additional Equipment:   Intra-op Plan:   Post-operative Plan:   Informed Consent: I have reviewed the patients History and Physical, chart, labs and discussed the procedure including the risks, benefits and alternatives for the proposed anesthesia with the patient or authorized representative who has indicated his/her understanding and acceptance.     Dental advisory given  Plan Discussed with: CRNA  Anesthesia Plan Comments:        Anesthesia Quick Evaluation

## 2021-12-17 NOTE — H&P (Signed)
Preoperative History & Physical Exam  Surgeon: Matt Holmes, MD  Diagnosis: Right Carpal Tunnel Syndrome, Right cubital tunnel syndrome, left thumb cmc ostoearthritis  Planned Procedure: Procedure(s) (LRB): Right cubital tunnel release in situ, possible transposition, (Right) CARPAL TUNNEL RELEASE (Right) Left thumb CMC joint steroid injection (Left)  History of Present Illness:   Patient is a 61 y.o. female with symptoms consistent with Right Carpal Tunnel Syndrome, Right cubital tunnel syndrome, left thumb cmc ostoearthritis who presents for surgical intervention. The risks, benefits and alternatives of surgical intervention were discussed and informed consent was obtained prior to surgery.  Past Medical History:  Past Medical History:  Diagnosis Date   Allergy    Anaphylaxis    pneumococcal and flu vaccine   ASCUS of cervix with negative high risk HPV 03/2019   Asthma    Eczema    Gastroparesis    GERD (gastroesophageal reflux disease)    Headache(784.0)    migraines   History of COVID-19 11/2020   History of COVID-19 01/2021   fever x 2 days, fatigue a while all symptoms resolved   Hypercholesteremia    Hypothyroidism    Osteoarthritis    hands   Osteopenia 06/2013   T score -1.2 FRAX 8.6%/0.6%   Overdose, drug 10/30/2011   accidental   PONV (postoperative nausea and vomiting)    Reflux    Right carpal tunnel syndrome    Seizures ([place])    1 seizure secondary to wellbutrin 20 yrs ago per pt on 12-15-2021-none since   Tachycardia    takes metoprolol succinate for   Thyroid cancer ([place]) 2007   papillary yrs ago nodule removed   VAIN I (vaginal intraepithelial neoplasia grade I) 09/2013, 11/2014    with positive high-risk HPV screen    Wears glasses    or contacts    Past Surgical History:  Past Surgical History:  Procedure Laterality Date   BREAST SURGERY     reduction mammoplasty yrs ago   CHOLECYSTECTOMY  11/23/2002   colonscopy     dr hung not sure  when   COLPOSCOPY  02/22/2012   VAIN I   COSMETIC SURGERY     buttock liposuction yrs ago   [place]  24/58/0998   NISSEN FUNDOPLICATION  3382, 5053   REFLUX   thryoid nodule removed for papillary cancer     yrs ago per pt on 12-15-2021   THYROID SURGERY  11/23/2005   for thyroid cancer   UPPER GASTROINTESTINAL ENDOSCOPY  08/26/2004   VAGINAL HYSTERECTOMY  11/23/2005   irregular bleeding   VARICOSE VEIN SURGERY  03/20/2003   right leg    Medications:  Prior to Admission medications   Medication Sig Start Date End Date Taking? Authorizing Provider  acyclovir ointment (ZOVIRAX) 5 % APPLY ONE APPLICATION TOPICALLY EVERY THREE HOURS Patient taking differently: as needed. 07/24/17   Wardell Honour, MD  albuterol (VENTOLIN HFA) 108 (90 Base) MCG/ACT inhaler INHALE TWO PUFFS BY MOUTH EVERY FOUR HOURS AS NEEDED FOR WHEEZING, SHORTNESS OF BREATH OR COUGH Patient taking differently: as needed. During allergy season 02/26/18   Wardell Honour, MD  EPINEPHrine 0.3 mg/0.3 mL IJ SOAJ injection Inject 0.3 mLs (0.3 mg total) into the muscle once. Patient not taking: Reported on 04/12/2019 10/24/14   Darlyne Russian, MD  ezetimibe (ZETIA) 10 MG tablet Take 1 tablet (10 mg total) by mouth daily. 02/26/18   Wardell Honour, MD  lansoprazole (PREVACID SOLUTAB) 30 MG disintegrating tablet FOR DIRECTIONS  ON HOW TO   TAKE THIS MEDICINE, READ   THE ENCLOSED MEDICATION    INFORMATION FORM Patient taking differently: 90 mg. 11/29/17   Wardell Honour, MD  metoprolol succinate (TOPROL-XL) 25 MG 24 hr tablet TAKE 1 TABLET EVERY        MORNING, IF BLOOD PRESSURE STILL ELEVATED TAKE 1      TABLET IN AFTERNOON Patient taking differently: Pt takes for tachacardia not htn 10/11/17   Wardell Honour, MD  ondansetron (ZOFRAN-ODT) 4 MG disintegrating tablet Take 1 tablet (4 mg total) by mouth every 8 (eight) hours as needed for nausea or vomiting. 02/26/18   Wardell Honour, MD  pimecrolimus (ELIDEL) 1 % cream Apply topically  2 (two) times daily. 05/25/21   Isla Pence, MD  rosuvastatin (CRESTOR) 20 MG tablet TAKE 1 TABLET DAILY 05/13/18   Wardell Honour, MD  SYNTHROID 88 MCG tablet Take 1 tablet (88 mcg total) by mouth daily before breakfast. 07/24/17   Wardell Honour, MD  traZODone (DESYREL) 50 MG tablet TAKE 1-2 TABLETS (50-100 MG TOTAL) BY MOUTH AT BEDTIME AS NEEDED FOR SLEEP. 04/14/18   Wardell Honour, MD  triamcinolone cream (KENALOG) 0.1 % APPLY 1 APPLICATION TOPICALLY 2 (TWO) TIMES DAILY. 04/12/18   Wardell Honour, MD  valACYclovir (VALTREX) 500 MG tablet TAKE ONE TABLET BY MOUTH TWICE DAILY Patient taking differently: as needed. For cold sores 02/22/18   Fontaine, Belinda Block, MD  vitamin B-12 (CYANOCOBALAMIN) 1000 MCG tablet Take 1,000 mcg by mouth as needed.    [provider]    Allergies:  Clarithromycin, Fluogen [influenza virus vaccine], Lunesta [eszopiclone], Pneumococcal vaccine, Pneumococcal vaccines, Sulfamethoxazole-trimethoprim, Tetanus toxoids, Wellbutrin [bupropion hcl], Zolpidem, Atarax [hydroxyzine], Bactrim, Clarithromycin, Doxycycline, Hydone [chlorthalidone], Ranitidine, Reglan [metoclopramide], Sulfa antibiotics, and Tetracyclines & related  Review of Systems: Negative except per HPI.  Physical Exam: Alert and oriented, NAD Head and neck: no masses, normal alignment CV: pulse intact Pulm: no increased work of breathing, respirations even and unlabored Abdomen: non-distended Extremities: extremities warm and well perfused  LABS: No results found for this or any previous visit (from the past 2160 hour(s)).   Complete History and Physical exam available in the office notes  Orene Desanctis

## 2021-12-17 NOTE — Discharge Instructions (Addendum)
Orthopaedic Hand Surgery Discharge Instructions  WEIGHT BEARING STATUS: Non weight bearing on operative extremity  DRESSING CARE: Please keep your dressing/splint/cast clean and dry until your follow-up appointment. You may shower by placing a waterproof covering over your dressing/splint/cast. Contact your surgeon if your splint/cast gets wet. It will need to be changed to prevent skin breakdown.  PAIN CONTROL: First line medications for post operative pain control are Tylenol (acetaminophen) and Motrin (ibuprofen) if you are able to take these medications. If you have been prescribed a medication these can be taken as breakthrough pain medications. Please note that some narcotic pain medication has acetaminophen added and you should never consume more than 4,000mg  of acetaminophen in 24-hour period. Please note that if you are given Toradol (ketorolac) you should not take similar medications such as ibuprofen or naproxen.  DISCHARGE MEDICATIONS: If you have been prescribed medication it was sent electronically to your pharmacy. No changes have been made to your home medications.  ICE/ELEVATION: Ice and elevate your injured extremity as needed. Avoid direct contact of ice with skin.   BANDAGE FEELS TOO TIGHT: If your bandage feels too tight, first make sure you are elevating your fingers as much as possible. The outer layer of the bandage can be unwrapped and reapplied more loosely. If no improvement, you may carefully cut the inner layer longitudinally until the pressure has resolved and then rewrap the outer layer. If you are not comfortable with these instructions, please call the office and the bandage can be changed for you.   FOLLOW UP: You will be called after surgery with an appointment date and time, however if you have not received a phone call within 3 days, please call during regular office hours at [phone] to schedule a post operative appointment.  Please Seek Medical Attention  if: Call MD for: pain or pressure in chest, jaw, arm, back, neck  Call MD for: temperature greater than 101 F for more than 24 hrs Call MD for: difficulty breathing Call MD for: incision redness, bleeding, drainage  Call MD for: palpitations or feeling that the heart is racing  Call MD for: increased swelling in arm, leg, ankle, or abdomen  Call MD for: lightheadedness, dizziness, fainting Call 911 or go to ER for any medical emergency if you are not able to get in touch with your doctor   J. Sable Feil, MD Orthopaedic Hand Surgeon [facility] Office number: [phone] [address] [place], [place] [address]   NO TYLENOL CONTAINING PRODUCTS UNTIL AFTER 1230 PM TODAY.    Regional Anesthesia Blocks  1. Numbness or the inability to move the "blocked" extremity may last from 3-48 hours after placement. The length of time depends on the medication injected and your individual response to the medication. If the numbness is not going away after 48 hours, call your surgeon.  2. The extremity that is blocked will need to be protected until the numbness is gone and the  Strength has returned. Because you cannot feel it, you will need to take extra care to avoid injury. Because it may be weak, you may have difficulty moving it or using it. You may not know what position it is in without looking at it while the block is in effect.  3. For blocks in the legs and feet, returning to weight bearing and walking needs to be done carefully. You will need to wait until the numbness is entirely gone and the strength has returned. You should be able to move  your leg and foot normally before you try and bear weight or walk. You will need someone to be with you when you first try to ensure you do not fall and possibly risk injury.  4. Bruising and tenderness at the needle site are common side effects and will resolve in a few days.  5. Persistent numbness or new problems with movement should  be communicated to the surgeon or the [facility] [phone] [place] [phone]).  Post Anesthesia Home Care Instructions  Activity: Get plenty of rest for the remainder of the day. A responsible individual must stay with you for 24 hours following the procedure.  For the next 24 hours, DO NOT: -Drive a car -Paediatric nurse -Drink alcoholic beverages -Take any medication unless instructed by your physician -Make any legal decisions or sign important papers.  Meals: Start with liquid foods such as gelatin or soup. Progress to regular foods as tolerated. Avoid greasy, spicy, heavy foods. If nausea and/or vomiting occur, drink only clear liquids until the nausea and/or vomiting subsides. Call your physician if vomiting continues.  Special Instructions/Symptoms: Your throat may feel dry or sore from the anesthesia or the breathing tube placed in your throat during surgery. If this causes discomfort, gargle with warm salt water. The discomfort should disappear within 24 hours.

## 2021-12-18 ENCOUNTER — Ambulatory Visit (HOSPITAL_BASED_OUTPATIENT_CLINIC_OR_DEPARTMENT_OTHER): Payer: Commercial Managed Care - PPO | Admitting: Anesthesiology

## 2021-12-18 ENCOUNTER — Ambulatory Visit (HOSPITAL_BASED_OUTPATIENT_CLINIC_OR_DEPARTMENT_OTHER)
Admission: RE | Admit: 2021-12-18 | Discharge: 2021-12-18 | Disposition: A | Discharge status: Home / Self Care | Payer: Commercial Managed Care - PPO | Source: Ambulatory Visit | Attending: Orthopedic Surgery | Admitting: Orthopedic Surgery

## 2021-12-18 ENCOUNTER — Ambulatory Visit (HOSPITAL_BASED_OUTPATIENT_CLINIC_OR_DEPARTMENT_OTHER): Payer: Commercial Managed Care - PPO

## 2021-12-18 ENCOUNTER — Other Ambulatory Visit: Payer: Self-pay

## 2021-12-18 ENCOUNTER — Encounter (HOSPITAL_BASED_OUTPATIENT_CLINIC_OR_DEPARTMENT_OTHER)
Admission: RE | Disposition: A | Discharge status: Home / Self Care | Payer: Self-pay | Source: Ambulatory Visit | Attending: Orthopedic Surgery

## 2021-12-18 ENCOUNTER — Encounter (HOSPITAL_BASED_OUTPATIENT_CLINIC_OR_DEPARTMENT_OTHER): Payer: Self-pay | Admitting: Orthopedic Surgery

## 2021-12-18 DIAGNOSIS — G5601 Carpal tunnel syndrome, right upper limb: Secondary | ICD-10-CM | POA: Diagnosis present

## 2021-12-18 DIAGNOSIS — G5621 Lesion of ulnar nerve, right upper limb: Secondary | ICD-10-CM | POA: Diagnosis not present

## 2021-12-18 DIAGNOSIS — J45909 Unspecified asthma, uncomplicated: Secondary | ICD-10-CM | POA: Insufficient documentation

## 2021-12-18 HISTORY — DX: Carpal tunnel syndrome, right upper limb: G56.01

## 2021-12-18 HISTORY — DX: Presence of spectacles and contact lenses: Z97.3

## 2021-12-18 HISTORY — PX: CARPAL TUNNEL RELEASE: SHX101

## 2021-12-18 HISTORY — PX: STERIOD INJECTION: SHX5046

## 2021-12-18 HISTORY — DX: Tachycardia, unspecified: R00.0

## 2021-12-18 HISTORY — PX: ULNAR TUNNEL RELEASE: SHX820

## 2021-12-18 HISTORY — DX: Dermatitis, unspecified: L30.9

## 2021-12-18 SURGERY — RELEASE, CUBITAL TUNNEL
Anesthesia: Monitor Anesthesia Care | Site: Hand | Laterality: Right

## 2021-12-18 MED ORDER — ACETAMINOPHEN 500 MG PO TABS
ORAL_TABLET | ORAL | Status: AC
  Filled 2021-12-18: qty 2

## 2021-12-18 MED ORDER — ONDANSETRON HCL 4 MG/2ML IJ SOLN
INTRAMUSCULAR | Status: DC | PRN
  Administered 2021-12-18: 4 mg via INTRAVENOUS

## 2021-12-18 MED ORDER — 0.9 % SODIUM CHLORIDE (POUR BTL) OPTIME
TOPICAL | Status: DC | PRN
  Administered 2021-12-18: 500 mL

## 2021-12-18 MED ORDER — PHENYLEPHRINE HCL (PRESSORS) 10 MG/ML IV SOLN
INTRAVENOUS | Status: DC | PRN
  Administered 2021-12-18 (×3): 80 ug via INTRAVENOUS

## 2021-12-18 MED ORDER — EPHEDRINE 5 MG/ML INJ
INTRAVENOUS | Status: AC
  Filled 2021-12-18: qty 5

## 2021-12-18 MED ORDER — ONDANSETRON HCL 4 MG/2ML IJ SOLN
INTRAMUSCULAR | Status: AC
  Filled 2021-12-18: qty 2

## 2021-12-18 MED ORDER — PROPOFOL 500 MG/50ML IV EMUL
INTRAVENOUS | Status: AC
  Filled 2021-12-18: qty 50

## 2021-12-18 MED ORDER — CEFAZOLIN SODIUM-DEXTROSE 2-4 GM/100ML-% IV SOLN
2.0000 g | INTRAVENOUS | Status: AC
  Administered 2021-12-18: 2 g via INTRAVENOUS

## 2021-12-18 MED ORDER — LIDOCAINE HCL (PF) 2 % IJ SOLN
INTRAMUSCULAR | Status: AC
  Filled 2021-12-18: qty 5

## 2021-12-18 MED ORDER — FENTANYL CITRATE (PF) 100 MCG/2ML IJ SOLN
100.0000 ug | Freq: Once | INTRAMUSCULAR | Status: AC
  Administered 2021-12-18: 100 ug via INTRAVENOUS

## 2021-12-18 MED ORDER — FENTANYL CITRATE (PF) 100 MCG/2ML IJ SOLN
INTRAMUSCULAR | Status: AC
  Filled 2021-12-18: qty 2

## 2021-12-18 MED ORDER — CEFAZOLIN SODIUM-DEXTROSE 2-4 GM/100ML-% IV SOLN
INTRAVENOUS | Status: AC
  Filled 2021-12-18: qty 100

## 2021-12-18 MED ORDER — AMISULPRIDE (ANTIEMETIC) 5 MG/2ML IV SOLN
INTRAVENOUS | Status: AC
  Filled 2021-12-18: qty 4

## 2021-12-18 MED ORDER — MIDAZOLAM HCL 5 MG/5ML IJ SOLN
INTRAMUSCULAR | Status: DC | PRN
  Administered 2021-12-18: 2 mg via INTRAVENOUS

## 2021-12-18 MED ORDER — FENTANYL CITRATE (PF) 100 MCG/2ML IJ SOLN
25.0000 ug | INTRAMUSCULAR | Status: DC | PRN
  Administered 2021-12-18 (×2): 25 ug via INTRAVENOUS

## 2021-12-18 MED ORDER — EPHEDRINE SULFATE (PRESSORS) 50 MG/ML IJ SOLN
INTRAMUSCULAR | Status: DC | PRN
  Administered 2021-12-18: 5 mg via INTRAVENOUS

## 2021-12-18 MED ORDER — PROPOFOL 10 MG/ML IV BOLUS
INTRAVENOUS | Status: DC | PRN
  Administered 2021-12-18: 10 mg via INTRAVENOUS
  Administered 2021-12-18: 20 mg via INTRAVENOUS

## 2021-12-18 MED ORDER — BUPIVACAINE-EPINEPHRINE (PF) 0.5% -1:200000 IJ SOLN
INTRAMUSCULAR | Status: DC | PRN
  Administered 2021-12-18: 25 mL via PERINEURAL

## 2021-12-18 MED ORDER — PROPOFOL 500 MG/50ML IV EMUL
INTRAVENOUS | Status: DC | PRN
  Administered 2021-12-18: 75 ug/kg/min via INTRAVENOUS

## 2021-12-18 MED ORDER — LACTATED RINGERS IV SOLN: INTRAVENOUS | Status: DC

## 2021-12-18 MED ORDER — FENTANYL CITRATE (PF) 100 MCG/2ML IJ SOLN
INTRAMUSCULAR | Status: DC | PRN
  Administered 2021-12-18 (×2): 25 ug via INTRAVENOUS

## 2021-12-18 MED ORDER — LIDOCAINE HCL (PF) 1 % IJ SOLN
INTRAMUSCULAR | Status: DC | PRN
  Administered 2021-12-18: 1 mL

## 2021-12-18 MED ORDER — AMISULPRIDE (ANTIEMETIC) 5 MG/2ML IV SOLN
10.0000 mg | Freq: Once | INTRAVENOUS | Status: AC
  Administered 2021-12-18: 10 mg via INTRAVENOUS

## 2021-12-18 MED ORDER — PHENYLEPHRINE 40 MCG/ML (10ML) SYRINGE FOR IV PUSH (FOR BLOOD PRESSURE SUPPORT)
PREFILLED_SYRINGE | INTRAVENOUS | Status: AC
  Filled 2021-12-18: qty 10

## 2021-12-18 MED ORDER — MIDAZOLAM HCL 2 MG/2ML IJ SOLN
INTRAMUSCULAR | Status: AC
  Filled 2021-12-18: qty 2

## 2021-12-18 MED ORDER — BACITRACIN ZINC 500 UNIT/GM EX OINT
TOPICAL_OINTMENT | CUTANEOUS | Status: DC | PRN
  Administered 2021-12-18: 1 via TOPICAL

## 2021-12-18 MED ORDER — ACETAMINOPHEN 500 MG PO TABS
1000.0000 mg | ORAL_TABLET | Freq: Once | ORAL | Status: AC
  Administered 2021-12-18: 1000 mg via ORAL

## 2021-12-18 MED ORDER — TRIAMCINOLONE ACETONIDE 40 MG/ML IJ SUSP
INTRAMUSCULAR | Status: DC | PRN
  Administered 2021-12-18: 40 mg via INTRA_ARTICULAR

## 2021-12-18 MED ORDER — DIPHENHYDRAMINE HCL 50 MG/ML IJ SOLN
INTRAMUSCULAR | Status: DC | PRN
  Administered 2021-12-18: 12.5 mg via INTRAVENOUS

## 2021-12-18 MED ORDER — DIPHENHYDRAMINE HCL 50 MG/ML IJ SOLN
INTRAMUSCULAR | Status: AC
  Filled 2021-12-18: qty 1

## 2021-12-18 MED ORDER — PROPOFOL 10 MG/ML IV BOLUS
INTRAVENOUS | Status: AC
  Filled 2021-12-18: qty 20

## 2021-12-18 MED ORDER — LIDOCAINE HCL (CARDIAC) PF 100 MG/5ML IV SOSY
PREFILLED_SYRINGE | INTRAVENOUS | Status: DC | PRN
  Administered 2021-12-18: 40 mg via INTRAVENOUS

## 2021-12-18 MED ORDER — MIDAZOLAM HCL 2 MG/2ML IJ SOLN
2.0000 mg | Freq: Once | INTRAMUSCULAR | Status: AC
  Administered 2021-12-18: 2 mg via INTRAVENOUS

## 2021-12-18 SURGICAL SUPPLY — 55 items
BLADE SURG 15 STRL LF DISP TIS (BLADE) ×4 IMPLANT
BLADE SURG 15 STRL SS (BLADE) ×4
BNDG ADH 1X3 FABRIC TAN LF (GAUZE/BANDAGES/DRESSINGS) ×1 IMPLANT
BNDG ADH 3X1 STRCH TAN LF (GAUZE/BANDAGES/DRESSINGS) ×3
BNDG CMPR 9X4 STRL LF SNTH (GAUZE/BANDAGES/DRESSINGS) ×3
BNDG ELASTIC 4X5.8 VLCR STR LF (GAUZE/BANDAGES/DRESSINGS) ×6 IMPLANT
BNDG ESMARK 4X9 LF (GAUZE/BANDAGES/DRESSINGS) ×5 IMPLANT
CORD BIPOLAR FORCEPS 12FT (ELECTRODE) ×1 IMPLANT
COVER BACK TABLE 60X90IN (DRAPES) ×5 IMPLANT
CUFF TOURN SGL QUICK 18 (TOURNIQUET CUFF) ×5 IMPLANT
CUFF TOURN SGL QUICK 18X4 (TOURNIQUET CUFF) ×5 IMPLANT
DECANTER SPIKE VIAL GLASS SM (MISCELLANEOUS) IMPLANT
DRAPE EXTREMITY T 121X128X90 (DISPOSABLE) ×5 IMPLANT
DRAPE SHEET LG 3/4 BI-LAMINATE (DRAPES) ×5 IMPLANT
DRSG ADAPTIC 3X8 NADH LF (GAUZE/BANDAGES/DRESSINGS) ×1 IMPLANT
DRSG EMULSION OIL 3X3 NADH (GAUZE/BANDAGES/DRESSINGS) ×5 IMPLANT
GAUZE 4X4 16PLY ~~LOC~~+RFID DBL (SPONGE) ×5 IMPLANT
GAUZE SPONGE 4X4 12PLY STRL (GAUZE/BANDAGES/DRESSINGS) ×5 IMPLANT
GAUZE SPONGE 4X4 12PLY STRL LF (GAUZE/BANDAGES/DRESSINGS) ×1 IMPLANT
GLOVE SURG UNDER POLY LF SZ7.5 (GLOVE) ×5 IMPLANT
GOWN STRL REUS W/ TWL LRG LVL3 (GOWN DISPOSABLE) ×4 IMPLANT
GOWN STRL REUS W/TWL LRG LVL3 (GOWN DISPOSABLE) ×9 IMPLANT
HIBICLENS CHG 4% 4OZ BTL (MISCELLANEOUS) ×5 IMPLANT
KIT TURNOVER CYSTO (KITS) ×5 IMPLANT
KNIFE CARPAL TUNNEL (BLADE) ×5 IMPLANT
LOOP VESSEL MAXI BLUE (MISCELLANEOUS) IMPLANT
NEEDLE HYPO 22GX1.5 SAFETY (NEEDLE) ×5 IMPLANT
NS IRRIG 500ML POUR BTL (IV SOLUTION) ×5 IMPLANT
PACK BASIN DAY SURGERY FS (CUSTOM PROCEDURE TRAY) ×5 IMPLANT
PAD CAST 4YDX4 CTTN HI CHSV (CAST SUPPLIES) ×4 IMPLANT
PADDING CAST ABS 4INX4YD NS (CAST SUPPLIES) ×1
PADDING CAST ABS COTTON 4X4 ST (CAST SUPPLIES) ×4 IMPLANT
PADDING CAST COTTON 4X4 STRL (CAST SUPPLIES) ×4
SLING ARM FOAM STRAP MED (SOFTGOODS) ×1 IMPLANT
SPLINT FAST PLASTER 5X30 (CAST SUPPLIES)
SPLINT FIBERGLASS 3X35 (CAST SUPPLIES) ×4 IMPLANT
SPLINT PLASTER CAST FAST 5X30 (CAST SUPPLIES) IMPLANT
SPLINT PLASTER CAST XFAST 3X15 (CAST SUPPLIES) IMPLANT
SPLINT PLASTER XTRA FASTSET 3X (CAST SUPPLIES)
SUCTION FRAZIER HANDLE 10FR (MISCELLANEOUS)
SUCTION TUBE FRAZIER 10FR DISP (MISCELLANEOUS) IMPLANT
SUT BONE WAX W31G (SUTURE) IMPLANT
SUT ETHILON 4 0 PS 2 18 (SUTURE) ×5 IMPLANT
SUT FIBERWIRE 2-0 18 17.9 3/8 (SUTURE)
SUT PROLENE 4 0 PS 2 18 (SUTURE) IMPLANT
SUT VIC AB 3-0 FS2 27 (SUTURE) IMPLANT
SUTURE FIBERWR 2-0 18 17.9 3/8 (SUTURE) IMPLANT
SYR 10ML LL (SYRINGE) ×5 IMPLANT
SYR BULB EAR ULCER 3OZ GRN STR (SYRINGE) ×5 IMPLANT
TAPE SURG TRANSPORE 1 IN (GAUZE/BANDAGES/DRESSINGS) ×4 IMPLANT
TAPE SURGICAL TRANSPORE 1 IN (GAUZE/BANDAGES/DRESSINGS) ×4
TOWEL OR 17X26 10 PK STRL BLUE (TOWEL DISPOSABLE) ×5 IMPLANT
TRAY DSU PREP LF (CUSTOM PROCEDURE TRAY) ×5 IMPLANT
TUBE CONNECTING 12X1/4 (SUCTIONS) IMPLANT
UNDERPAD 30X36 HEAVY ABSORB (UNDERPADS AND DIAPERS) ×5 IMPLANT

## 2021-12-18 NOTE — Transfer of Care (Signed)
Immediate Anesthesia Transfer of Care Note  Patient: Michelle [name]  Procedure(s) Performed: Procedure(s) (LRB): Right cubital tunnel release in situ (Right) CARPAL TUNNEL RELEASE (Right) Left thumb CMC joint steroid injection (Left)  Patient Location: PACU  Anesthesia Type: General  Level of Consciousness: awake, sedated, patient cooperative and responds to stimulation  Airway & Oxygen Therapy: Patient Spontanous Breathing and Patient connected to [place] 02 and soft FM   Post-op Assessment: Report given to PACU RN, Post -op Vital signs reviewed and stable and Patient moving all extremities  Post vital signs: Reviewed and stable  Complications: No apparent anesthesia complications

## 2021-12-18 NOTE — Anesthesia Procedure Notes (Signed)
Anesthesia Regional Block: Supraclavicular block   Pre-Anesthetic Checklist: , timeout performed,  Correct Patient, Correct Site, Correct Laterality,  Correct Procedure, Correct Position, site marked,  Risks and benefits discussed,  Pre-op evaluation,  At surgeon's request and post-op pain management  Laterality: Right  Prep: Maximum Sterile Barrier Precautions used, chloraprep       Needles:  Injection technique: Single-shot  Needle Type: Echogenic Stimulator Needle     Needle Length: 9cm  Needle Gauge: 21     Additional Needles:   Procedures:,,,, ultrasound used (permanent image in chart),,    Narrative:  Start time: 12/18/2021 7:09 AM End time: 12/18/2021 7:19 AM Injection made incrementally with aspirations every 5 mL.  Performed by: Personally  Anesthesiologist: Roderic Palau, MD

## 2021-12-18 NOTE — Anesthesia Postprocedure Evaluation (Signed)
Anesthesia Post Note  Patient: SELAH KLANG  Procedure(s) Performed: Right cubital tunnel release in situ (Right: Elbow) CARPAL TUNNEL RELEASE (Right: Hand) Left thumb CMC joint steroid injection (Left: Hand)     Patient location during evaluation: PACU Anesthesia Type: Regional and MAC Level of consciousness: awake and alert Pain management: pain level controlled Vital Signs Assessment: post-procedure vital signs reviewed and stable Respiratory status: spontaneous breathing, nonlabored ventilation and respiratory function stable Cardiovascular status: stable and blood pressure returned to baseline Postop Assessment: no apparent nausea or vomiting Anesthetic complications: no   No notable events documented.  Last Vitals:  Vitals:   12/18/21 0915 12/18/21 0955  BP: 133/89 140/85  Pulse: 74 75  Resp: 14 16  Temp:    SpO2: (!) 89% 95%    Last Pain:  Vitals:   12/18/21 0955  TempSrc:   PainSc: 6                  Houda Brau,W. EDMOND

## 2021-12-18 NOTE — Op Note (Signed)
OPERATIVE NOTE  DATE OF PROCEDURE: 12/18/21   SURGEONS:  Primary: Izell Gasconade, MD  PREOPERATIVE DIAGNOSIS: Right cubital tunnel syndrome, Right carpal tunnel syndrome  POSTOPERATIVE DIAGNOSIS: Same  NAME OF PROCEDURE:   Right cubital tunnel release, in situ decompression of ulnar nerve Right carpal tunnel syndrome  ANESTHESIA: Local + MAC  SKIN PREPARATION: Hibiclens  ESTIMATED BLOOD LOSS: Minimal  IMPLANTS: none  INDICATIONS: Michelle [name] is a 61 y.o. who has the diagnosis of right cubital and carpal tunnel syndrome. The patient has decided to proceed with surgical intervention.  Risks, benefits and alternatives of operative management were discussed including, but not limited to, risks of anesthesia complications, infection, pain, persistent symptoms, stiffness, need for future surgery.  The patient understands, agrees and elects to proceed with surgery.    DESCRIPTION OF PROCEDURE: The patient was met in the pre-operative area and their identity was verified.  The operative location and laterality was also verified and marked.  The patient was brought to the OR and was placed supine on the table.  After repeat patient identification with the operative team anesthesia was provided and the patient was prepped and draped in the usual sterile fashion.  A final timeout was performed verifying the correction patient, procedure, location and laterality.  The right upper extremity was elevated and exsanguinated with an esmarch and tourniquet inflated to 259mHg. An incision was made over the medial elbow at the cubital tunnel. Skin and subcutaneous tissues were divided and the medial antebrachial cutaneous nerves were protected. The roof of the cubital tunnel was incised between the medial epicondyle and the olecranon. The ulnar nerve was decompressed distally and proximally taking care to release the superficial and deep fascia of the FCU as well as the compressive fibers proximally.  There was complete release of the ulnar nerve. The elbow was taken through a range of motion and there was no subluxation of the nerve. The wound was thoroughly irrigated with normal saline and closed with 4-0 nylon suture in horizontal mattress fashion.  A standard 1.5 cm incision was made in the midpalm.  This was carried down through the subcutaneous tissues and palmar fascia to the transverse carpal ligament.  The distal one-half of the transverse carpal ligament was incised longitudinally under direct vision using a 15 blade.  The carpal tunnel release guide was then placed under direct vision on the transverse carpal ligament and slid proximally.  The guide was palpated into appropriate alignment longitudinally.  Contact with the transverse carpal ligament was maintained throughout passing.  The blade was engaged into the guide and the remaining portion of the transverse carpal ligament released completely.  No other abnormalities were noted.  The wound was copiously irrigated and the skin closed using horizontal mattress 4-0 nylon sutures.  A light bulky dressing was placed over the hand and elbow. At the end of the case all counts were correct x2. The patient tolerated the procedure well, was awoken from anesthesia and brought to PACU in stable condition.   JIzell Willows MD

## 2021-12-18 NOTE — Interval H&P Note (Signed)
History and Physical Interval Note:  12/18/2021 7:33 AM  Michelle [name]  has presented today for surgery, with the diagnosis of Right Carpal Tunnel Syndrome, Right cubital tunnel syndrome, left thumb cmc ostoearthritis.  The various methods of treatment have been discussed with the patient and family. After consideration of risks, benefits and other options for treatment, the patient has consented to  Procedure(s) with comments: Right cubital tunnel release in situ, possible transposition, (Right) - with MAC anesthesia CARPAL TUNNEL RELEASE (Right) Left thumb CMC joint steroid injection (Left) as a surgical intervention.  The patient's history has been reviewed, patient examined, no change in status, stable for surgery.  I have reviewed the patient's chart and labs.  Questions were answered to the patient's satisfaction.     Orene Desanctis

## 2021-12-18 NOTE — Progress Notes (Signed)
Assisted Dr. Edmond Fitzgerald with right, ultrasound guided, supraclavicular block. Side rails up, monitors on throughout procedure. See vital signs in flow sheet. Tolerated Procedure well. 

## 2021-12-18 NOTE — Anesthesia Procedure Notes (Signed)
Procedure Name: MAC Date/Time: 12/18/2021 7:39 AM Performed by: Georgeanne Nim, CRNA Pre-anesthesia Checklist: Timeout performed, Patient being monitored, Suction available, Emergency Drugs available and Patient identified Patient Re-evaluated:Patient Re-evaluated prior to induction Oxygen Delivery Method: Simple face mask Preoxygenation: Pre-oxygenation with 100% oxygen Induction Type: IV induction Placement Confirmation: breath sounds checked- equal and bilateral, CO2 detector and positive ETCO2

## 2021-12-19 ENCOUNTER — Encounter (HOSPITAL_BASED_OUTPATIENT_CLINIC_OR_DEPARTMENT_OTHER): Payer: Self-pay | Admitting: Orthopedic Surgery

## 2022-05-18 ENCOUNTER — Encounter (HOSPITAL_BASED_OUTPATIENT_CLINIC_OR_DEPARTMENT_OTHER): Payer: Self-pay | Admitting: Obstetrics and Gynecology

## 2022-05-18 ENCOUNTER — Emergency Department (HOSPITAL_BASED_OUTPATIENT_CLINIC_OR_DEPARTMENT_OTHER): Payer: Commercial Managed Care - PPO

## 2022-05-18 ENCOUNTER — Emergency Department (HOSPITAL_BASED_OUTPATIENT_CLINIC_OR_DEPARTMENT_OTHER)
Admission: EM | Admit: 2022-05-18 | Discharge: 2022-05-18 | Disposition: A | Discharge status: Home / Self Care | Payer: Commercial Managed Care - PPO | Attending: Emergency Medicine | Admitting: Emergency Medicine

## 2022-05-18 ENCOUNTER — Other Ambulatory Visit: Payer: Self-pay

## 2022-05-18 DIAGNOSIS — D72829 Elevated white blood cell count, unspecified: Secondary | ICD-10-CM | POA: Insufficient documentation

## 2022-05-18 DIAGNOSIS — S301XXA Contusion of abdominal wall, initial encounter: Secondary | ICD-10-CM | POA: Diagnosis not present

## 2022-05-18 DIAGNOSIS — X58XXXA Exposure to other specified factors, initial encounter: Secondary | ICD-10-CM | POA: Insufficient documentation

## 2022-05-18 DIAGNOSIS — R1032 Left lower quadrant pain: Secondary | ICD-10-CM | POA: Diagnosis present

## 2022-05-18 DIAGNOSIS — R110 Nausea: Secondary | ICD-10-CM | POA: Diagnosis not present

## 2022-05-18 DIAGNOSIS — R109 Unspecified abdominal pain: Secondary | ICD-10-CM

## 2022-05-18 LAB — URINALYSIS, ROUTINE W REFLEX MICROSCOPIC
Bilirubin Urine: NEGATIVE
Glucose, UA: NEGATIVE mg/dL
Hgb urine dipstick: NEGATIVE
Ketones, ur: NEGATIVE mg/dL
Nitrite: NEGATIVE
Protein, ur: NEGATIVE mg/dL
Specific Gravity, Urine: 1.018 (ref 1.005–1.030)
pH: 5.5 (ref 5.0–8.0)

## 2022-05-18 LAB — CBC
HCT: 39.6 % (ref 36.0–46.0)
Hemoglobin: 13.5 g/dL (ref 12.0–15.0)
MCH: 30.6 pg (ref 26.0–34.0)
MCHC: 34.1 g/dL (ref 30.0–36.0)
MCV: 89.8 fL (ref 80.0–100.0)
Platelets: 280 10*3/uL (ref 150–400)
RBC: 4.41 MIL/uL (ref 3.87–5.11)
RDW: 12.3 % (ref 11.5–15.5)
WBC: 10.8 10*3/uL — ABNORMAL HIGH (ref 4.0–10.5)
nRBC: 0 % (ref 0.0–0.2)

## 2022-05-18 LAB — COMPREHENSIVE METABOLIC PANEL
ALT: 37 U/L (ref 0–44)
AST: 34 U/L (ref 15–41)
Albumin: 4.5 g/dL (ref 3.5–5.0)
Alkaline Phosphatase: 51 U/L (ref 38–126)
Anion gap: 9 (ref 5–15)
BUN: 12 mg/dL (ref 8–23)
CO2: 28 mmol/L (ref 22–32)
Calcium: 9.8 mg/dL (ref 8.9–10.3)
Chloride: 104 mmol/L (ref 98–111)
Creatinine, Ser: 1.01 mg/dL — ABNORMAL HIGH (ref 0.44–1.00)
GFR, Estimated: >60 mL/min (ref >60)
Glucose, Bld: 100 mg/dL — ABNORMAL HIGH (ref 70–99)
Potassium: 3.5 mmol/L (ref 3.5–5.1)
Sodium: 141 mmol/L (ref 135–145)
Total Bilirubin: 0.7 mg/dL (ref 0.3–1.2)
Total Protein: 7 g/dL (ref 6.5–8.1)

## 2022-05-18 LAB — LIPASE, BLOOD: Lipase: 26 U/L (ref 11–51)

## 2022-05-18 MED ORDER — ONDANSETRON HCL 4 MG/2ML IJ SOLN
4.0000 mg | Freq: Once | INTRAMUSCULAR | Status: AC
  Administered 2022-05-18: 4 mg via INTRAVENOUS
  Filled 2022-05-18: qty 2

## 2022-05-18 MED ORDER — KETOROLAC TROMETHAMINE 10 MG PO TABS: 10.0000 mg | ORAL_TABLET | Freq: Four times a day (QID) | ORAL | 0 refills | Status: AC | PRN

## 2022-05-18 MED ORDER — KETOROLAC TROMETHAMINE 15 MG/ML IJ SOLN
15.0000 mg | Freq: Once | INTRAMUSCULAR | Status: AC
Start: 2022-05-18 — End: 2022-05-18
  Administered 2022-05-18: 15 mg via INTRAVENOUS
  Filled 2022-05-18: qty 1

## 2022-05-18 MED ORDER — CYCLOBENZAPRINE HCL 10 MG PO TABS: 10.0000 mg | ORAL_TABLET | Freq: Two times a day (BID) | ORAL | 0 refills | Status: AC | PRN

## 2022-05-18 MED ORDER — ONDANSETRON HCL 4 MG PO TABS: 4.0000 mg | ORAL_TABLET | Freq: Four times a day (QID) | ORAL | 0 refills | Status: AC

## 2022-05-18 MED ORDER — MORPHINE SULFATE (PF) 4 MG/ML IV SOLN
4.0000 mg | Freq: Once | INTRAVENOUS | Status: AC
Start: 2022-05-18 — End: 2022-05-18
  Administered 2022-05-18: 4 mg via INTRAVENOUS
  Filled 2022-05-18: qty 1

## 2022-05-18 MED ORDER — FENTANYL CITRATE PF 50 MCG/ML IJ SOSY
50.0000 ug | PREFILLED_SYRINGE | INTRAMUSCULAR | Status: AC | PRN
  Administered 2022-05-18 (×2): 50 ug via INTRAVENOUS
  Filled 2022-05-18 (×2): qty 1

## 2022-05-18 NOTE — ED Notes (Signed)
Pt verbalizes understanding of discharge instructions. Opportunity for questioning and answers were provided. Pt discharged from ED to home with family.    

## 2022-05-18 NOTE — ED Triage Notes (Signed)
Patient reports left sided flank pain that radiates around to the front of her abdomen. Patient endorses some nausea. Denies blood in urine or hx of kidney stones. Reports this pain has been going on x4 days.

## 2022-05-18 NOTE — ED Provider Notes (Signed)
[facility] EMERGENC[facility] DEPT Provider Note   CSN: [id number] Arrival date & time: 05/18/22  1635     History  Chief Complaint  Patient presents with   Flank Pain    Michelle [name] is a 61 y.o. female who presents to the emergency department for evaluation of left flank pain radiating into the left lower quadrant started 4 days ago and is progressively worsened.  Patient states pain is constant, sharp.  She also has associated nausea.  She has taken ibuprofen and over-the-counter medications without improvement in symptoms.  She denies dysuria, hematuria, fevers, shortness of breath, vomiting and diarrhea.  She has no previous history of kidney stones.   Flank Pain       Home Medications Prior to Admission medications   Medication Sig Start Date End Date Taking? Authorizing Provider  cyclobenzaprine (FLEXERIL) 10 MG tablet Take 1 tablet (10 mg total) by mouth 2 (two) times daily as needed for muscle spasms. 05/18/22  Yes Kathe Becton R, PA-C  ketorolac (TORADOL) 10 MG tablet Take 1 tablet (10 mg total) by mouth every 6 (six) hours as needed. 05/18/22  Yes Kathe Becton R, PA-C  ondansetron (ZOFRAN) 4 MG tablet Take 1 tablet (4 mg total) by mouth every 6 (six) hours. 05/18/22  Yes Kathe Becton R, PA-C  acyclovir ointment (ZOVIRAX) 5 % APPLY ONE APPLICATION TOPICALLY EVERY THREE HOURS Patient taking differently: as needed. 07/24/17   Wardell Honour, MD  albuterol (VENTOLIN HFA) 108 (90 Base) MCG/ACT inhaler INHALE TWO PUFFS BY MOUTH EVERY FOUR HOURS AS NEEDED FOR WHEEZING, SHORTNESS OF BREATH OR COUGH Patient taking differently: as needed. During allergy season 02/26/18   Wardell Honour, MD  EPINEPHrine 0.3 mg/0.3 mL IJ SOAJ injection Inject 0.3 mLs (0.3 mg total) into the muscle once. Patient not taking: Reported on 04/12/2019 10/24/14   Darlyne Russian, MD  ezetimibe (ZETIA) 10 MG tablet Take 1 tablet (10 mg total) by mouth daily. 02/26/18   Wardell Honour, MD   lansoprazole (PREVACID SOLUTAB) 30 MG disintegrating tablet FOR DIRECTIONS ON HOW TO   TAKE THIS MEDICINE, READ   THE ENCLOSED MEDICATION    INFORMATION FORM Patient taking differently: 90 mg. 11/29/17   Wardell Honour, MD  metoprolol succinate (TOPROL-XL) 25 MG 24 hr tablet TAKE 1 TABLET EVERY        MORNING, IF BLOOD PRESSURE STILL ELEVATED TAKE 1      TABLET IN AFTERNOON Patient taking differently: Pt takes for tachacardia not htn 10/11/17   Wardell Honour, MD  ondansetron (ZOFRAN-ODT) 4 MG disintegrating tablet Take 1 tablet (4 mg total) by mouth every 8 (eight) hours as needed for nausea or vomiting. 02/26/18   Wardell Honour, MD  pimecrolimus (ELIDEL) 1 % cream Apply topically 2 (two) times daily. 05/25/21   Isla Pence, MD  rosuvastatin (CRESTOR) 20 MG tablet TAKE 1 TABLET DAILY 05/13/18   Wardell Honour, MD  SYNTHROID 88 MCG tablet Take 1 tablet (88 mcg total) by mouth daily before breakfast. 07/24/17   Wardell Honour, MD  traZODone (DESYREL) 50 MG tablet TAKE 1-2 TABLETS (50-100 MG TOTAL) BY MOUTH AT BEDTIME AS NEEDED FOR SLEEP. 04/14/18   Wardell Honour, MD  triamcinolone cream (KENALOG) 0.1 % APPLY 1 APPLICATION TOPICALLY 2 (TWO) TIMES DAILY. 04/12/18   Wardell Honour, MD  valACYclovir (VALTREX) 500 MG tablet TAKE ONE TABLET BY MOUTH TWICE DAILY Patient taking differently: as needed. For cold sores 02/22/18  Fontaine, Belinda Block, MD  vitamin B-12 (CYANOCOBALAMIN) 1000 MCG tablet Take 1,000 mcg by mouth as needed.    [provider]      Allergies    Clarithromycin, Fluogen [influenza virus vaccine], Lunesta [eszopiclone], Pneumococcal vaccine, Pneumococcal vaccines, Sulfamethoxazole-trimethoprim, Tetanus toxoids, Wellbutrin [bupropion hcl], Zolpidem, Atarax [hydroxyzine], Bactrim, Clarithromycin, Doxycycline, Hydone [chlorthalidone], Ranitidine, Reglan [metoclopramide], Sulfa antibiotics, and Tetracyclines & related    Review of Systems   Review of Systems  Genitourinary:   Positive for flank pain.    Physical Exam Updated Vital Signs BP 133/81   Pulse 71   Temp 98.6 F (37 C)   Resp 18   Ht '5\' 4"'$  (1.626 m)   Wt 66.2 kg   LMP 05/23/2006   SpO2 94%   BMI 25.06 kg/m  Physical Exam Vitals and nursing note reviewed.  Constitutional:      General: She is not in acute distress.    Appearance: She is not ill-appearing.  HENT:     Head: Atraumatic.  Eyes:     Conjunctiva/sclera: Conjunctivae normal.  Cardiovascular:     Rate and Rhythm: Normal rate and regular rhythm.     Pulses: Normal pulses.     Heart sounds: No murmur heard. Pulmonary:     Effort: Pulmonary effort is normal. No respiratory distress.     Breath sounds: Normal breath sounds.  Abdominal:     General: Abdomen is flat. There is no distension.     Palpations: Abdomen is soft.     Tenderness: There is abdominal tenderness. There is no right CVA tenderness or left CVA tenderness.       Comments: Abdomen is soft, nondistended.  She has tenderness to palpation in the left upper quadrant although there is a large bruise noted in that area.  Negative CVA tenderness bilaterally.  Musculoskeletal:        General: Normal range of motion.     Cervical back: Normal range of motion.  Skin:    General: Skin is warm and dry.     Capillary Refill: Capillary refill takes less than 2 seconds.  Neurological:     General: No focal deficit present.     Mental Status: She is alert.  Psychiatric:        Mood and Affect: Mood normal.     ED Results / Procedures / Treatments   Labs (all labs ordered are listed, but only abnormal results are displayed) Labs Reviewed  COMPREHENSIVE METABOLIC PANEL - Abnormal; Notable for the following components:      Result Value   Glucose, Bld 100 (*)    Creatinine, Ser 1.01 (*)    All other components within normal limits  CBC - Abnormal; Notable for the following components:   WBC 10.8 (*)    All other components within normal limits  URINALYSIS,  ROUTINE W REFLEX MICROSCOPIC - Abnormal; Notable for the following components:   Leukocytes,Ua MODERATE (*)    Non Squamous Epithelial 0-5 (*)    All other components within normal limits  LIPASE, BLOOD    EKG None  Radiology CT Renal Stone Study  Result Date: 05/18/2022 CLINICAL DATA:  Left sided flank pain EXAM: CT ABDOMEN AND PELVIS WITHOUT CONTRAST TECHNIQUE: Multidetector CT imaging of the abdomen and pelvis was performed following the standard protocol without IV contrast. RADIATION DOSE REDUCTION: This exam was performed according to the departmental dose-optimization program which includes automated exposure control, adjustment of the mA and/or kV according to patient size and/or use  of iterative reconstruction technique. COMPARISON:  CT abdomen pelvis 05/29/2015 FINDINGS: Lower chest: No acute abnormality. Evaluation of the abdominal viscera is limited by the lack of IV contrast Hepatobiliary: Hepatic steatosis. No focal liver lesion. Gallbladder is surgically absent. Pancreas: Unremarkable. Spleen: Normal in size without focal abnormality. Adrenals/Urinary Tract: Adrenal glands are unremarkable. Kidneys are symmetric in size. No renal calculi or hydronephrosis. No mass lesion. Stomach/Bowel: There are surgical changes at the GE junction, similar to prior. Appendix appears normal. No evidence of bowel wall thickening, distention, or inflammatory changes. Vascular/Lymphatic: Minimal aortic atherosclerosis. Vascular patency cannot be assessed in the absence of IV contrast. No enlarged abdominal or pelvic lymph nodes. Reproductive: Status post hysterectomy. No adnexal masses. Other: No abdominal wall hernia or abnormality. No abdominopelvic ascites. Musculoskeletal: No acute or significant osseous findings. IMPRESSION: 1. No acute abnormality in the abdomen or pelvis on a noncontrast exam. Specifically, no evidence of renal calculi or hydronephrosis. 2. Hepatic steatosis. 3. Aortic  atherosclerosis. Aortic Atherosclerosis (ICD10-I70.0). Electronically Signed   By: Audie Pinto M.D.   On: 05/18/2022 19:33    Procedures Procedures    Medications Ordered in ED Medications  fentaNYL (SUBLIMAZE) injection 50 mcg (50 mcg Intravenous Given 05/18/22 1906)  ondansetron (ZOFRAN) injection 4 mg (4 mg Intravenous Given 05/18/22 1720)  ketorolac (TORADOL) 15 MG/ML injection 15 mg (15 mg Intravenous Given 05/18/22 2052)  morphine (PF) 4 MG/ML injection 4 mg (4 mg Intravenous Given 05/18/22 2052)  ondansetron (ZOFRAN) injection 4 mg (4 mg Intravenous Given 05/18/22 2052)    ED Course/ Medical Decision Making/ A&P                           Medical Decision Making Amount and/or Complexity of Data Reviewed Labs: ordered. Radiology: ordered.  Risk Prescription drug management.   Social determinants of health:  Social History   Socioeconomic History   Marital status: Married    Spouse name: [name]   Number of children: 2   Years of education: [employer] education level: Not on file  Occupational History   Occupation: homemaker  Tobacco Use   Smoking status: Never   Smokeless tobacco: Never  Vaping Use   Vaping Use: Never used  Substance and Sexual Activity   Alcohol use: Never   Drug use: Never   Sexual activity: Not Currently    Partners: Male    Birth control/protection: Surgical    Comment: 1st intercourse 22 yo-1 partner-[facility]  Other Topics Concern   Not on file  Social History Narrative   Marital status: married x 36 years      Children: 2 children (1 biological son 89 years old; 27 adopted daughter 65 years old); no grandchildren.      Lives: with husband, 1 adopted Sports coach (17 years old specialty needs); history of marital discord.      Tobacco:  None      Alcohol:  Rarely      Drugs: none      Exercise: Yes. Walking daily around neighborhood.      Receiving therapy with Vivia Budge.   Son lives locally.   Adopted daughter from  [place] 11/2016.   Social Determinants of Health   Financial Resource Strain: Not on file  Food Insecurity: Not on file  Transportation Needs: Not on file  Physical Activity: Not on file  Stress: Not on file  Social Connections: Not on file  Intimate Partner Violence: Not on file  Initial impression:  This patient presents to the ED for concern of left flank pain, this involves an extensive number of treatment options, and is a complaint that carries with it a high risk of complications and morbidity.   Differentials include pyelonephritis, nephrolithiasis, UTI, splenic enlargement, musculoskeletal.   Comorbidities affecting care:  None  Additional history obtained: None  Lab Tests  I Ordered, reviewed, and interpreted labs and EKG.  The pertinent results include:  Leukocytosis 10.8 CMP and lipase normal UA without evidence of infection  Imaging Studies ordered:  I ordered imaging studies including  CT renal stone without acute findings I independently visualized and interpreted imaging and I agree with the radiologist interpretation.    Medicines ordered and prescription drug management:  I ordered medication including: Fentanyl 50 mcg Zofran 4 mg Morphine 4 mg Toradol 50 mg Reevaluation of the patient after these medicines showed that the patient improved I have reviewed the patients home medicines and have made adjustments as needed    ED Course/Re-evaluation: 61 year old female presents emergency department for evaluation of left flank pain x4 days.  Vitals are without significant abnormality.  On exam, patient has tenderness to the left upper quadrant although there is a large bruise noted in that area.  Patient initially did not remember where she got the bruise but then after discussion with her husband remembered that she received it after falling onto a stool about 1 week ago.  Abdomen is otherwise quite soft, nondistended.  Negative CVA tenderness  bilaterally.  She has moderate amounts of leukocytes on her urine without blood or evidence of infection otherwise.  Mild leukocytosis of 10.8.  CMP and lipase unremarkable.  I ordered CT renal stone study which was without acute findings.  After extensive work-up, I do not think that patient's symptoms are secondary to emergent cause.  It is possibly she has some tenderness to the area due to the bruise on her abdomen from last week or possibly has muscular tenderness.  Discharged home with anti-inflammatories, nausea medication and muscle relaxers.  Strict return precautions discussed.  Disposition:  After consideration of the diagnostic results, physical exam, history and the patients response to treatment feel that the patent would benefit from discharge.   Left flank pain: Plan and management as described above. Discharged home in good condition. Final Clinical Impression(s) / ED Diagnoses Final diagnoses:  Flank pain    Rx / DC Orders ED Discharge Orders          Ordered    ondansetron (ZOFRAN) 4 MG tablet  Every 6 hours        05/18/22 2103    cyclobenzaprine (FLEXERIL) 10 MG tablet  2 times daily PRN        05/18/22 2103    ketorolac (TORADOL) 10 MG tablet  Every 6 hours PRN        05/18/22 2103              Rodena Piety 05/18/22 2148    Fredia Sorrow, MD 05/23/22 [phone]

## 2023-05-13 ENCOUNTER — Ambulatory Visit
Admission: EM | Admit: 2023-05-13 | Discharge: 2023-05-13 | Disposition: A | Discharge status: Home / Self Care | Payer: Commercial Managed Care - PPO | Attending: Nurse Practitioner | Admitting: Nurse Practitioner

## 2023-05-13 DIAGNOSIS — L209 Atopic dermatitis, unspecified: Secondary | ICD-10-CM

## 2023-05-13 MED ORDER — PREDNISONE 10 MG (21) PO TBPK: ORAL_TABLET | Freq: Every day | ORAL | 0 refills | Status: DC

## 2023-05-13 NOTE — ED Provider Notes (Addendum)
[facility]    CSN: [id number] Arrival date & time: 05/13/23  1618      History   Chief Complaint Chief Complaint  Patient presents with   Skin Ulcer    HPI Michelle [name] is a 62 y.o. female that is for evaluation of a rash.  Patient reports a history of an atopic dermatitis that has not been officially diagnosed as eczema.  She states she seen dermatology but they did not know what it was specifically.  She reports these flares that occur on her back neck and behind your ears and in her scalp.  This  one started about 6 weeks ago.  It is very pruritic.  She has been "bathing" in triamcinolone cream without improvement.  No swelling, drainage, fevers.  Does report she has had relief with oral prednisone in the past.  No other concerns at this time.  HPI  Past Medical History:  Diagnosis Date   Allergy    Anaphylaxis    pneumococcal and flu vaccine   ASCUS of cervix with negative high risk HPV 03/2019   Asthma    Eczema    Gastroparesis    GERD (gastroesophageal reflux disease)    Headache(784.0)    migraines   History of COVID-19 11/2020   History of COVID-19 01/2021   fever x 2 days, fatigue a while all symptoms resolved   Hypercholesteremia    Hypothyroidism    Osteoarthritis    hands   Osteopenia 06/2013   T score -1.2 FRAX 8.6%/0.6%   Overdose, drug 10/30/2011   accidental   PONV (postoperative nausea and vomiting)    Reflux    Right carpal tunnel syndrome    Seizures (HCC)    1 seizure secondary to wellbutrin 20 yrs ago per pt on 12-15-2021-none since   Tachycardia    takes metoprolol succinate for   Thyroid cancer (HCC) 2007   papillary yrs ago nodule removed   VAIN I (vaginal intraepithelial neoplasia grade I) 09/2013, 11/2014    with positive high-risk HPV screen    Wears glasses    or contacts    Patient Active Problem List   Diagnosis Date Noted   Insomnia 08/25/2016   Abnormal brain MRI 11/07/2015   Cough 04/02/2013   Abnormal CXR  04/02/2013   Leukocytosis 10/18/2012   History of anaphylaxis 03/01/2012   Depression 10/30/2011   H/O suicide attempt 10/30/2011   Hx of thyroid cancer    Hypothyroidism    Tachycardia    Asthma    Gastroparesis    Hypercholesteremia    VAIN (vaginal intraepithelial neoplasia)    GERD (gastroesophageal reflux disease) 07/16/2011    Past Surgical History:  Procedure Laterality Date   BREAST SURGERY     reduction mammoplasty yrs ago   CARPAL TUNNEL RELEASE Right 12/18/2021   Procedure: CARPAL TUNNEL RELEASE;  Surgeon: Gomez Cleverly, MD;  Location: [facility] ;  Service: Orthopedics;  Laterality: Right;  procedure3   CHOLECYSTECTOMY  11/23/2002   colonscopy     dr hung not sure when   COLPOSCOPY  02/22/2012   VAIN I   COSMETIC SURGERY     buttock liposuction yrs ago   HERNIA REPAIR  11/23/2010   NISSEN FUNDOPLICATION  2004, 2012   REFLUX   STERIOD INJECTION Left 12/18/2021   Procedure: Left thumb CMC joint steroid injection;  Surgeon: Gomez Cleverly, MD;  Location: [facility];  Service: Orthopedics;  Laterality: Left;  procedure 1  thryoid nodule removed for papillary cancer     yrs ago per pt on 12-15-2021   THYROID SURGERY  11/23/2005   for thyroid cancer   ULNAR TUNNEL RELEASE Right 12/18/2021   Procedure: Right cubital tunnel release in situ;  Surgeon: Gomez Cleverly, MD;  Location: [facility];  Service: Orthopedics;  Laterality: Right;  procedure 2   UPPER GASTROINTESTINAL ENDOSCOPY  08/26/2004   VAGINAL HYSTERECTOMY  11/23/2005   irregular bleeding   VARICOSE VEIN SURGERY  03/20/2003   right leg    OB History     Gravida  1   Para  1   Term      Preterm      AB      Living  1      SAB      IAB      Ectopic      Multiple      Live Births               Home Medications    Prior to Admission medications   Medication Sig Start Date End Date Taking? Authorizing Provider  predniSONE (STERAPRED  UNI-PAK 21 TAB) 10 MG (21) TBPK tablet Take by mouth daily. Take 6 tabs by mouth daily  for 1 day, then 5 tabs for 1 day, then 4 tabs for 1 day, then 3 tabs for 1 day, 2 tabs for 1 day, then 1 tab by mouth daily for 1 days 05/13/23  Yes Radford Pax, NP  acyclovir ointment (ZOVIRAX) 5 % APPLY ONE APPLICATION TOPICALLY EVERY THREE HOURS Patient taking differently: as needed. 07/24/17   Ethelda Chick, MD  albuterol (VENTOLIN HFA) 108 (90 Base) MCG/ACT inhaler INHALE TWO PUFFS BY MOUTH EVERY FOUR HOURS AS NEEDED FOR WHEEZING, SHORTNESS OF BREATH OR COUGH Patient taking differently: as needed. During allergy season 02/26/18   Ethelda Chick, MD  cyclobenzaprine (FLEXERIL) 10 MG tablet Take 1 tablet (10 mg total) by mouth 2 (two) times daily as needed for muscle spasms. 05/18/22   Janell Quiet, PA-C  EPINEPHrine 0.3 mg/0.3 mL IJ SOAJ injection Inject 0.3 mLs (0.3 mg total) into the muscle once. Patient not taking: Reported on 04/12/2019 10/24/14   Collene Gobble, MD  ezetimibe (ZETIA) 10 MG tablet Take 1 tablet (10 mg total) by mouth daily. 02/26/18   Ethelda Chick, MD  ketorolac (TORADOL) 10 MG tablet Take 1 tablet (10 mg total) by mouth every 6 (six) hours as needed. 05/18/22   Janell Quiet, PA-C  lansoprazole (PREVACID SOLUTAB) 30 MG disintegrating tablet FOR DIRECTIONS ON HOW TO   TAKE THIS MEDICINE, READ   THE ENCLOSED MEDICATION    INFORMATION FORM Patient taking differently: 90 mg. 11/29/17   Ethelda Chick, MD  metoprolol succinate (TOPROL-XL) 25 MG 24 hr tablet TAKE 1 TABLET EVERY        MORNING, IF BLOOD PRESSURE STILL ELEVATED TAKE 1      TABLET IN AFTERNOON Patient taking differently: Pt takes for tachacardia not htn 10/11/17   Ethelda Chick, MD  ondansetron (ZOFRAN) 4 MG tablet Take 1 tablet (4 mg total) by mouth every 6 (six) hours. 05/18/22   Janell Quiet, PA-C  ondansetron (ZOFRAN-ODT) 4 MG disintegrating tablet Take 1 tablet (4 mg total) by mouth every 8 (eight) hours as needed for  nausea or vomiting. 02/26/18   Ethelda Chick, MD  pimecrolimus (ELIDEL) 1 % cream Apply topically 2 (two) times daily.  05/25/21   Jacalyn Lefevre, MD  rosuvastatin (CRESTOR) 20 MG tablet TAKE 1 TABLET DAILY 05/13/18   Ethelda Chick, MD  SYNTHROID 88 MCG tablet Take 1 tablet (88 mcg total) by mouth daily before breakfast. 07/24/17   Ethelda Chick, MD  traZODone (DESYREL) 50 MG tablet TAKE 1-2 TABLETS (50-100 MG TOTAL) BY MOUTH AT BEDTIME AS NEEDED FOR SLEEP. 04/14/18   Ethelda Chick, MD  triamcinolone cream (KENALOG) 0.1 % APPLY 1 APPLICATION TOPICALLY 2 (TWO) TIMES DAILY. 04/12/18   Ethelda Chick, MD  valACYclovir (VALTREX) 500 MG tablet TAKE ONE TABLET BY MOUTH TWICE DAILY Patient taking differently: as needed. For cold sores 02/22/18   Fontaine, Nadyne Coombes, MD  vitamin B-12 (CYANOCOBALAMIN) 1000 MCG tablet Take 1,000 mcg by mouth as needed.    [provider]    Family History Family History  Problem Relation Age of Onset   Heart disease Mother 19       CABG x 2   Hypertension Mother    Hyperlipidemia Mother    Heart disease Father 99       AMI as cause of death   Hypertension Father    Emphysema Father 53       was a smoker   COPD Father    Heart disease Sister    Hypertension Sister    Spina bifida Sister    Diabetes Paternal Grandmother    Multiple sclerosis Other    Cancer Maternal Grandmother    Heart disease Brother    Hypertension Brother    Leukemia Maternal Grandfather    Peripheral Artery Disease Sister    Cancer Sister        Lung cancer   Hypertension Brother    Arthritis Brother    Hypertension Brother    Stroke Brother    Hypertension Brother    Heart disease Brother 23       2V CABG   Hypertension Brother    Cancer Maternal Aunt        leukemia    Social History Social History   Tobacco Use   Smoking status: Never   Smokeless tobacco: Never  Vaping Use   Vaping Use: Never used  Substance Use Topics   Alcohol use: Never   Drug use:  Never     Allergies   Bee venom, Clarithromycin, Fluogen [influenza virus vaccine], Lunesta [eszopiclone], Pneumococcal vaccine, Pneumococcal vaccines, Sulfamethoxazole-trimethoprim, Tetanus toxoids, Wellbutrin [bupropion hcl], Zolpidem, Metoclopramide, Other, Ranitidine, Atarax [hydroxyzine], Bactrim, Clarithromycin, Doxycycline, Hydone [chlorthalidone], Sulfa antibiotics, and Tetracyclines & related   Review of Systems Review of Systems  Skin:  Positive for rash.     Physical Exam Triage Vital Signs ED Triage Vitals  Enc Vitals Group     BP 05/13/23 1641 (!) 134/95     Pulse Rate 05/13/23 1641 91     Resp 05/13/23 1641 18     Temp 05/13/23 1641 98.6 F (37 C)     Temp Source 05/13/23 1633 Oral     SpO2 05/13/23 1641 97 %     Weight --      Height --      Head Circumference --      Peak Flow --      Pain Score 05/13/23 1640 0     Pain Loc --      Pain Edu? --      Excl. in GC? --    No data found.  Updated Vital Signs BP (!) 134/95 (BP  Location: Right Arm)   Pulse 91   Temp 98.6 F (37 C) (Oral)   Resp 18   LMP 05/23/2006   SpO2 97%   Visual Acuity Right Eye Distance:   Left Eye Distance:   Bilateral Distance:    Right Eye Near:   Left Eye Near:    Bilateral Near:     Physical Exam Vitals and nursing note reviewed.  Constitutional:      Appearance: Normal appearance.  HENT:     Head: Normocephalic and atraumatic.  Eyes:     Pupils: Pupils are equal, round, and reactive to light.  Cardiovascular:     Rate and Rhythm: Normal rate.  Pulmonary:     Effort: Pulmonary effort is normal.  Skin:    General: Skin is warm and dry.     Findings: Rash present. Rash is macular and papular. Rash is not crusting, nodular, purpuric, pustular, scaling, urticarial or vesicular.     Comments: Mildly erythematous macular papular dry rash behind ears, lower scalp, neck, back.  No swelling, drainage, warmth  Neurological:     General: No focal deficit present.      Mental Status: She is alert and oriented to person, place, and time.  Psychiatric:        Mood and Affect: Mood normal.        Behavior: Behavior normal.      UC Treatments / Results  Labs (all labs ordered are listed, but only abnormal results are displayed) Labs Reviewed - No data to display  EKG   Radiology No results found.  Procedures Procedures (including critical care time)  Medications Ordered in UC Medications - No data to display  Initial Impression / Assessment and Plan / UC Course  I have reviewed the triage vital signs and the nursing notes.  Pertinent labs & imaging results that were available during my care of the patient were reviewed by me and considered in my medical decision making (see chart for details).     Reviewed exam and symptoms with patient.  Rash very similar to atopic dermatitis/eczema.  As she does report relief in the past with prednisone we will do prednisone taper.  Can continue tramadol some cream as needed.  Discussed OTC alcohol lotion as well.  Patient to follow-up with dermatologist or PCP if symptoms do not improve.  ER precautions reviewed and patient verbalized understanding Final Clinical Impressions(s) / UC Diagnoses   Final diagnoses:  Atopic dermatitis, unspecified type     Discharge Instructions      As a taper as prescribed.  Please follow-up with your dermatologist or PCP for additional treatment options Please go to the ER for any worsening symptoms    ED Prescriptions     Medication Sig Dispense Auth. Provider   predniSONE (STERAPRED UNI-PAK 21 TAB) 10 MG (21) TBPK tablet Take by mouth daily. Take 6 tabs by mouth daily  for 1 day, then 5 tabs for 1 day, then 4 tabs for 1 day, then 3 tabs for 1 day, 2 tabs for 1 day, then 1 tab by mouth daily for 1 days 21 tablet Radford Pax, NP      PDMP not reviewed this encounter.   Radford Pax, NP 05/13/23 1705    Radford Pax, NP 05/13/23 [phone]

## 2023-05-13 NOTE — ED Triage Notes (Signed)
Pt reports itching rash in ears, right hand and neck x 6 weeks. OTC creams gives no relief

## 2023-05-13 NOTE — Discharge Instructions (Addendum)
As a taper as prescribed.  Please follow-up with your dermatologist or PCP for additional treatment options Please go to the ER for any worsening symptoms

## 2024-07-12 ENCOUNTER — Ambulatory Visit
Admission: EM | Admit: 2024-07-12 | Discharge: 2024-07-12 | Disposition: A | Discharge status: Home / Self Care | Attending: Family Medicine | Admitting: Family Medicine

## 2024-07-12 DIAGNOSIS — J4520 Mild intermittent asthma, uncomplicated: Secondary | ICD-10-CM

## 2024-07-12 DIAGNOSIS — J189 Pneumonia, unspecified organism: Secondary | ICD-10-CM

## 2024-07-12 MED ORDER — AZITHROMYCIN 250 MG PO TABS: ORAL_TABLET | ORAL | 0 refills | Status: AC

## 2024-07-12 MED ORDER — AMOXICILLIN-POT CLAVULANATE 875-125 MG PO TABS: 1.0000 | ORAL_TABLET | Freq: Two times a day (BID) | ORAL | 0 refills | Status: DC

## 2024-07-12 MED ORDER — PROMETHAZINE-DM 6.25-15 MG/5ML PO SYRP: 5.0000 mL | ORAL_SOLUTION | Freq: Three times a day (TID) | ORAL | 0 refills | Status: AC | PRN

## 2024-07-12 MED ORDER — PREDNISONE 20 MG PO TABS: ORAL_TABLET | ORAL | 0 refills | Status: AC

## 2024-07-12 NOTE — ED Triage Notes (Signed)
 Pt c/o prod cough, head/chest congestion, fever x 2 weeks-states she felt better then worse x 1 week-NAD-steady gait

## 2024-07-12 NOTE — ED Provider Notes (Signed)
 [facility]  Note:  This document was prepared using Conservation officer, historic buildings and may include unintentional dictation errors.  MRN: [id number] DOB: March 16, 1961  Subjective:   Michelle [name] is a 63 y.o. female presenting for 2-day history of persistent productive cough, sinus and chest congestion, malaise, fatigue, fevers.  Patient had a few days of improvement before she worsened dramatically this past week.  Has a history of asthma but just got a refill for her albuterol  inhaler. No smoking of any kind including cigarettes, cigars, vaping, marijuana use.    No current facility-administered medications for this encounter.  Current Outpatient Medications:    acyclovir  ointment (ZOVIRAX ) 5 %, APPLY ONE APPLICATION TOPICALLY EVERY THREE HOURS (Patient taking differently: as needed.), Disp: 30 g, Rfl: 2   albuterol  (VENTOLIN  HFA) 108 (90 Base) MCG/ACT inhaler, INHALE TWO PUFFS BY MOUTH EVERY FOUR HOURS AS NEEDED FOR WHEEZING, SHORTNESS OF BREATH OR COUGH (Patient taking differently: as needed. During allergy season), Disp: 18 Inhaler, Rfl: 1   cyclobenzaprine  (FLEXERIL ) 10 MG tablet, Take 1 tablet (10 mg total) by mouth 2 (two) times daily as needed for muscle spasms., Disp: 20 tablet, Rfl: 0   EPINEPHrine  0.3 mg/0.3 mL IJ SOAJ injection, Inject 0.3 mLs (0.3 mg total) into the muscle once. (Patient not taking: Reported on 04/12/2019), Disp: 2 Device, Rfl: 2   ezetimibe  (ZETIA ) 10 MG tablet, Take 1 tablet (10 mg total) by mouth daily., Disp: 90 tablet, Rfl: 1   ketorolac  (TORADOL ) 10 MG tablet, Take 1 tablet (10 mg total) by mouth every 6 (six) hours as needed., Disp: 20 tablet, Rfl: 0   lansoprazole  (PREVACID  SOLUTAB) 30 MG disintegrating tablet, FOR DIRECTIONS ON HOW TO   TAKE THIS MEDICINE, READ   THE ENCLOSED MEDICATION    INFORMATION FORM (Patient taking differently: 90 mg.), Disp: 180 tablet, Rfl: 3   metoprolol  succinate (TOPROL -XL) 25 MG 24 hr tablet, TAKE 1  TABLET EVERY        MORNING, IF BLOOD PRESSURE STILL ELEVATED TAKE 1      TABLET IN AFTERNOON (Patient taking differently: Pt takes for tachacardia not htn), Disp: 180 tablet, Rfl: 2   ondansetron  (ZOFRAN ) 4 MG tablet, Take 1 tablet (4 mg total) by mouth every 6 (six) hours., Disp: 30 tablet, Rfl: 0   ondansetron  (ZOFRAN -ODT) 4 MG disintegrating tablet, Take 1 tablet (4 mg total) by mouth every 8 (eight) hours as needed for nausea or vomiting., Disp: 90 tablet, Rfl: 1   pimecrolimus  (ELIDEL ) 1 % cream, Apply topically 2 (two) times daily., Disp: 30 g, Rfl: 0   predniSONE  (STERAPRED UNI-PAK 21 TAB) 10 MG (21) TBPK tablet, Take by mouth daily. Take 6 tabs by mouth daily  for 1 day, then 5 tabs for 1 day, then 4 tabs for 1 day, then 3 tabs for 1 day, 2 tabs for 1 day, then 1 tab by mouth daily for 1 days, Disp: 21 tablet, Rfl: 0   rosuvastatin  (CRESTOR ) 20 MG tablet, TAKE 1 TABLET DAILY, Disp: 90 tablet, Rfl: 1   SYNTHROID  88 MCG tablet, Take 1 tablet (88 mcg total) by mouth daily before breakfast., Disp: 90 tablet, Rfl: 3   traZODone  (DESYREL ) 50 MG tablet, TAKE 1-2 TABLETS (50-100 MG TOTAL) BY MOUTH AT BEDTIME AS NEEDED FOR SLEEP., Disp: 60 tablet, Rfl: 5   triamcinolone  cream (KENALOG ) 0.1 %, APPLY 1 APPLICATION TOPICALLY 2 (TWO) TIMES DAILY., Disp: 30 g, Rfl: 0   valACYclovir  (VALTREX ) 500 MG  tablet, TAKE ONE TABLET BY MOUTH TWICE DAILY (Patient taking differently: as needed. For cold sores), Disp: 20 tablet, Rfl: 3   vitamin B-12 (CYANOCOBALAMIN) 1000 MCG tablet, Take 1,000 mcg by mouth as needed., Disp: , Rfl:   Facility-Administered Medications Ordered in Other Encounters:    gadopentetate dimeglumine  (MAGNEVIST ) injection 15 mL, 15 mL, Intravenous, Once PRN, Penumalli, Vikram R, MD   Allergies  Allergen Reactions   Bee Venom Anaphylaxis   Clarithromycin Anaphylaxis   Fluogen [Influenza Virus Vaccine] Anaphylaxis    Flu vaccine   Lunesta [Eszopiclone] Other (See Comments)    Causes sleep  walking events   Pneumococcal Vaccine Anaphylaxis   Pneumococcal Vaccines Anaphylaxis   Sulfamethoxazole-Trimethoprim Anaphylaxis   Tetanus Toxoids Swelling   Wellbutrin [Bupropion Hcl] Other (See Comments)    seizure   Zolpidem     Causes sleep walking events   Metoclopramide  Other (See Comments)    Tremors and ticks, possible permanence of effects  Other Reaction(s): tremors    Other Other (See Comments), Swelling and Hives    Pneumonia vaccine  Severe headache   Ranitidine Other (See Comments)    Acts like a diuretic   Atarax  [Hydroxyzine ] Nausea And Vomiting   Bactrim Hives and Other (See Comments)    Severe headache,gi symptoms   Clarithromycin Itching and Other (See Comments)    Severe headache, gi symptoms   Doxycycline Other (See Comments)    Severe GI upset   Hydone [Chlorthalidone] Other (See Comments)    Vasculitis    Sulfa Antibiotics Hives and Other (See Comments)    Severe headache   Tetracyclines & Related Other (See Comments)    Severe GI upset    Past Medical History:  Diagnosis Date   Allergy    Anaphylaxis    pneumococcal and flu vaccine   ASCUS of cervix with negative high risk HPV 03/2019   Asthma    Eczema    Gastroparesis    GERD (gastroesophageal reflux disease)    Headache(784.0)    migraines   History of COVID-19 11/2020   History of COVID-19 01/2021   fever x 2 days, fatigue a while all symptoms resolved   Hypercholesteremia    Hypothyroidism    Osteoarthritis    hands   Osteopenia 06/2013   T score -1.2 FRAX 8.6%/0.6%   Overdose, drug 10/30/2011   accidental   PONV (postoperative nausea and vomiting)    Reflux    Right carpal tunnel syndrome    Seizures (HCC)    1 seizure secondary to wellbutrin 20 yrs ago per pt on 12-15-2021-none since   Tachycardia    takes metoprolol  succinate for   Thyroid  cancer (HCC) 2007   papillary yrs ago nodule removed   VAIN I (vaginal intraepithelial neoplasia grade I) 09/2013, 11/2014    with  positive high-risk HPV screen    Wears glasses    or contacts     Past Surgical History:  Procedure Laterality Date   BREAST SURGERY     reduction mammoplasty yrs ago   CARPAL TUNNEL RELEASE Right 12/18/2021   Procedure: CARPAL TUNNEL RELEASE;  Surgeon: Alyse Agent, MD;  Location: [facility];  Service: Orthopedics;  Laterality: Right;  procedure3   CHOLECYSTECTOMY  11/23/2002   colonscopy     dr hung not sure when   COLPOSCOPY  02/22/2012   VAIN I   COSMETIC SURGERY     buttock liposuction yrs ago   HERNIA REPAIR  11/23/2010   NISSEN  FUNDOPLICATION  2004, 2012   REFLUX   STERIOD INJECTION Left 12/18/2021   Procedure: Left thumb CMC joint steroid injection;  Surgeon: Alyse Agent, MD;  Location: [facility];  Service: Orthopedics;  Laterality: Left;  procedure 1    thryoid nodule removed for papillary cancer     yrs ago per pt on 12-15-2021   THYROID  SURGERY  11/23/2005   for thyroid  cancer   ULNAR TUNNEL RELEASE Right 12/18/2021   Procedure: Right cubital tunnel release in situ;  Surgeon: Alyse Agent, MD;  Location: [facility];  Service: Orthopedics;  Laterality: Right;  procedure 2   UPPER GASTROINTESTINAL ENDOSCOPY  08/26/2004   VAGINAL HYSTERECTOMY  11/23/2005   irregular bleeding   VARICOSE VEIN SURGERY  03/20/2003   right leg    Family History  Problem Relation Age of Onset   Heart disease Mother 62       CABG x 2   Hypertension Mother    Hyperlipidemia Mother    Heart disease Father 54       AMI as cause of death   Hypertension Father    Emphysema Father 29       was a smoker   COPD Father    Heart disease Sister    Hypertension Sister    Spina bifida Sister    Diabetes Paternal Grandmother    Multiple sclerosis Other    Cancer Maternal Grandmother    Heart disease Brother    Hypertension Brother    Leukemia Maternal Grandfather    Peripheral Artery Disease Sister    Cancer Sister        Lung cancer    Hypertension Brother    Arthritis Brother    Hypertension Brother    Stroke Brother    Hypertension Brother    Heart disease Brother 12       2V CABG   Hypertension Brother    Cancer Maternal Aunt        leukemia    Social History   Tobacco Use   Smoking status: Never   Smokeless tobacco: Never  Vaping Use   Vaping status: Never Used  Substance Use Topics   Alcohol use: Never   Drug use: Never    ROS   Objective:   Vitals: BP (!) 146/90 (BP Location: Left Arm)   Pulse 94   Temp 98.3 F (36.8 C) (Oral)   Resp 20   LMP 05/23/2006   SpO2 96%   Physical Exam Constitutional:      General: She is not in acute distress.    Appearance: Normal appearance. She is well-developed. She is not ill-appearing, toxic-appearing or diaphoretic.  HENT:     Head: Normocephalic and atraumatic.     Nose: Nose normal.     Mouth/Throat:     Mouth: Mucous membranes are moist.     Pharynx: Posterior oropharyngeal erythema (with thick streaks of postnasal drainage overlying pharynx) present. No pharyngeal swelling, oropharyngeal exudate or uvula swelling.     Tonsils: No tonsillar exudate or tonsillar abscesses. 0 on the right. 0 on the left.  Eyes:     General: No scleral icterus.       Right eye: No discharge.        Left eye: No discharge.     Extraocular Movements: Extraocular movements intact.  Cardiovascular:     Rate and Rhythm: Normal rate and regular rhythm.     Heart sounds: Normal heart sounds. No murmur heard.  No friction rub. No gallop.  Pulmonary:     Effort: Pulmonary effort is normal. No respiratory distress.     Breath sounds: No stridor. Examination of the left-upper field reveals rales. Examination of the left-middle field reveals rales. Rales present. No decreased breath sounds, wheezing or rhonchi.  Chest:     Chest wall: No tenderness.  Skin:    General: Skin is warm and dry.  Neurological:     General: No focal deficit present.     Mental Status: She  is alert and oriented to person, place, and time.  Psychiatric:        Mood and Affect: Mood normal.        Behavior: Behavior normal.     Assessment and Plan :   PDMP not reviewed this encounter.  1. Pneumonia of left upper lobe due to infectious organism   2. Mild intermittent asthma, uncomplicated    Will treat empirically given pulmonary exam using Augmentin  and azithromycin .  Patient endorses that she has done well with either one.  She also has done well with prednisone  in the past and would be appropriate given her underlying asthma and persistent cough.  Deferred imaging and patient was in agreement.  Counseled patient on potential for adverse effects with medications prescribed/recommended today, ER and return-to-clinic precautions discussed, patient verbalized understanding.    Christopher Savannah, [place] 07/12/24 1413

## 2024-07-12 NOTE — Discharge Instructions (Addendum)
 Start amoxicillin -clavulanate and azithromycin  to treat left sided pneumonia. This can help with your upper respiratory airways as well. Use prednisone  as a respiratory boost given your asthma. Use cough syrup as needed.

## 2024-10-21 ENCOUNTER — Ambulatory Visit

## 2024-10-21 ENCOUNTER — Ambulatory Visit
Admission: EM | Admit: 2024-10-21 | Discharge: 2024-10-21 | Disposition: A | Discharge status: Home / Self Care | Attending: Family Medicine | Admitting: Family Medicine

## 2024-10-21 DIAGNOSIS — S61452A Open bite of left hand, initial encounter: Secondary | ICD-10-CM | POA: Diagnosis not present

## 2024-10-21 DIAGNOSIS — M79644 Pain in right finger(s): Secondary | ICD-10-CM | POA: Diagnosis not present

## 2024-10-21 DIAGNOSIS — S6991XA Unspecified injury of right wrist, hand and finger(s), initial encounter: Secondary | ICD-10-CM

## 2024-10-21 DIAGNOSIS — W503XXA Accidental bite by another person, initial encounter: Secondary | ICD-10-CM

## 2024-10-21 MED ORDER — AMOXICILLIN-POT CLAVULANATE 875-125 MG PO TABS: 1.0000 | ORAL_TABLET | Freq: Two times a day (BID) | ORAL | 0 refills | Status: AC

## 2024-10-21 NOTE — Discharge Instructions (Addendum)
 The clinic will contact you with results of the finger x-ray once radiology reads it if it is abnormal.  You may use a finger splint to help protect and stabilize your finger.  Elevate and ice as needed you may take Tylenol  or ibuprofen if needed.  Start Augmentin  twice daily for 3 days to prevent infection due to the bite on your left hand.  Please monitor very closely and if you develop any redness swelling fevers or chills please seek reevaluation as soon as possible.  Follow-up with your PCP in 2 to 3 days for recheck.  I hope you feel better soon!
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
## 2024-10-22 ENCOUNTER — Ambulatory Visit: Payer: Self-pay | Admitting: Nurse Practitioner
